# Patient Record
Sex: Female | Born: 1970 | Race: White | Hispanic: No | Marital: Married | Smoking: Former smoker
Health system: Southern US, Community
[De-identification: ages and names within clinical notes are randomized; demographics above are authoritative.]

## PROBLEM LIST (undated history)

## (undated) DIAGNOSIS — R569 Unspecified convulsions: Secondary | ICD-10-CM

## (undated) DIAGNOSIS — F319 Bipolar disorder, unspecified: Secondary | ICD-10-CM

## (undated) DIAGNOSIS — E079 Disorder of thyroid, unspecified: Secondary | ICD-10-CM

## (undated) DIAGNOSIS — R001 Bradycardia, unspecified: Secondary | ICD-10-CM

## (undated) DIAGNOSIS — F419 Anxiety disorder, unspecified: Secondary | ICD-10-CM

## (undated) DIAGNOSIS — F32A Depression, unspecified: Secondary | ICD-10-CM

## (undated) DIAGNOSIS — Z87442 Personal history of urinary calculi: Secondary | ICD-10-CM

## (undated) DIAGNOSIS — F329 Major depressive disorder, single episode, unspecified: Secondary | ICD-10-CM

## (undated) HISTORY — PX: LITHOTRIPSY: SUR834

## (undated) HISTORY — DX: Disorder of thyroid, unspecified: E07.9

## (undated) HISTORY — DX: Bipolar disorder, unspecified: F31.9

## (undated) HISTORY — PX: STRABISMUS SURGERY: SHX218

## (undated) HISTORY — PX: EYE SURGERY: SHX253

---
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
## 1997-12-25 ENCOUNTER — Other Ambulatory Visit: Admission: RE | Admit: 1997-12-25 | Discharge: 1997-12-25 | Payer: Self-pay | Admitting: Obstetrics and Gynecology

## 1998-08-22 ENCOUNTER — Inpatient Hospital Stay (HOSPITAL_COMMUNITY): Admission: AD | Admit: 1998-08-22 | Discharge: 1998-08-24 | Payer: Self-pay | Admitting: Obstetrics & Gynecology

## 1998-08-22 ENCOUNTER — Inpatient Hospital Stay (HOSPITAL_COMMUNITY): Admission: AD | Admit: 1998-08-22 | Discharge: 1998-08-22 | Payer: Self-pay | Admitting: Obstetrics & Gynecology

## 1998-10-09 ENCOUNTER — Emergency Department (HOSPITAL_COMMUNITY): Admission: EM | Admit: 1998-10-09 | Discharge: 1998-10-09 | Payer: Self-pay | Admitting: Emergency Medicine

## 1999-05-30 ENCOUNTER — Other Ambulatory Visit: Admission: RE | Admit: 1999-05-30 | Discharge: 1999-05-30 | Payer: Self-pay | Admitting: Obstetrics and Gynecology

## 2000-05-16 ENCOUNTER — Inpatient Hospital Stay (HOSPITAL_COMMUNITY): Admission: AD | Admit: 2000-05-16 | Discharge: 2000-05-18 | Payer: Self-pay | Admitting: Obstetrics & Gynecology

## 2000-06-15 ENCOUNTER — Other Ambulatory Visit: Admission: RE | Admit: 2000-06-15 | Discharge: 2000-06-15 | Payer: Self-pay | Admitting: Obstetrics and Gynecology

## 2001-05-04 ENCOUNTER — Encounter: Payer: Self-pay | Admitting: Family Medicine

## 2001-05-04 ENCOUNTER — Encounter: Admission: RE | Admit: 2001-05-04 | Discharge: 2001-05-04 | Payer: Self-pay | Admitting: Family Medicine

## 2001-05-18 HISTORY — PX: COLONOSCOPY: SHX174

## 2001-06-02 ENCOUNTER — Encounter (INDEPENDENT_AMBULATORY_CARE_PROVIDER_SITE_OTHER): Payer: Self-pay | Admitting: *Deleted

## 2001-06-02 ENCOUNTER — Inpatient Hospital Stay (HOSPITAL_COMMUNITY): Admission: EM | Admit: 2001-06-02 | Discharge: 2001-06-04 | Payer: Self-pay

## 2001-06-03 ENCOUNTER — Encounter: Payer: Self-pay | Admitting: *Deleted

## 2001-08-17 ENCOUNTER — Other Ambulatory Visit: Admission: RE | Admit: 2001-08-17 | Discharge: 2001-08-17 | Payer: Self-pay | Admitting: Obstetrics and Gynecology

## 2001-09-10 ENCOUNTER — Encounter: Payer: Self-pay | Admitting: Obstetrics and Gynecology

## 2001-09-10 ENCOUNTER — Encounter: Admission: RE | Admit: 2001-09-10 | Discharge: 2001-09-10 | Payer: Self-pay | Admitting: Obstetrics and Gynecology

## 2001-10-21 ENCOUNTER — Inpatient Hospital Stay (HOSPITAL_COMMUNITY): Admission: EM | Admit: 2001-10-21 | Discharge: 2001-10-23 | Payer: Self-pay | Admitting: Psychiatry

## 2002-07-05 ENCOUNTER — Inpatient Hospital Stay (HOSPITAL_COMMUNITY): Admission: AD | Admit: 2002-07-05 | Discharge: 2002-07-07 | Payer: Self-pay | Admitting: Obstetrics and Gynecology

## 2002-07-17 ENCOUNTER — Encounter: Payer: Self-pay | Admitting: *Deleted

## 2002-07-17 ENCOUNTER — Inpatient Hospital Stay (HOSPITAL_COMMUNITY): Admission: AD | Admit: 2002-07-17 | Discharge: 2002-07-17 | Payer: Self-pay | Admitting: *Deleted

## 2002-07-17 ENCOUNTER — Encounter (INDEPENDENT_AMBULATORY_CARE_PROVIDER_SITE_OTHER): Payer: Self-pay | Admitting: Specialist

## 2002-08-02 ENCOUNTER — Other Ambulatory Visit: Admission: RE | Admit: 2002-08-02 | Discharge: 2002-08-02 | Payer: Self-pay | Admitting: Obstetrics and Gynecology

## 2002-10-24 ENCOUNTER — Encounter: Payer: Self-pay | Admitting: Gastroenterology

## 2002-10-24 ENCOUNTER — Ambulatory Visit (HOSPITAL_COMMUNITY): Admission: RE | Admit: 2002-10-24 | Discharge: 2002-10-24 | Payer: Self-pay | Admitting: Gastroenterology

## 2002-10-27 ENCOUNTER — Ambulatory Visit (HOSPITAL_BASED_OUTPATIENT_CLINIC_OR_DEPARTMENT_OTHER): Admission: RE | Admit: 2002-10-27 | Discharge: 2002-10-27 | Payer: Self-pay | Admitting: Urology

## 2002-10-27 ENCOUNTER — Encounter: Payer: Self-pay | Admitting: Urology

## 2002-11-17 HISTORY — PX: ESOPHAGOGASTRODUODENOSCOPY: SHX1529

## 2002-11-30 ENCOUNTER — Ambulatory Visit (HOSPITAL_COMMUNITY): Admission: RE | Admit: 2002-11-30 | Discharge: 2002-11-30 | Payer: Self-pay | Admitting: Gastroenterology

## 2002-11-30 ENCOUNTER — Encounter: Payer: Self-pay | Admitting: Gastroenterology

## 2002-12-05 ENCOUNTER — Ambulatory Visit (HOSPITAL_COMMUNITY): Admission: RE | Admit: 2002-12-05 | Discharge: 2002-12-05 | Payer: Self-pay | Admitting: Gastroenterology

## 2002-12-16 ENCOUNTER — Encounter: Admission: RE | Admit: 2002-12-16 | Discharge: 2002-12-16 | Payer: Self-pay | Admitting: Gastroenterology

## 2002-12-16 ENCOUNTER — Encounter: Payer: Self-pay | Admitting: Gastroenterology

## 2002-12-17 HISTORY — PX: LAPAROSCOPIC CHOLECYSTECTOMY: SUR755

## 2003-01-10 ENCOUNTER — Observation Stay (HOSPITAL_COMMUNITY): Admission: RE | Admit: 2003-01-10 | Discharge: 2003-01-10 | Payer: Self-pay | Admitting: Surgery

## 2003-01-10 ENCOUNTER — Encounter (INDEPENDENT_AMBULATORY_CARE_PROVIDER_SITE_OTHER): Payer: Self-pay | Admitting: Specialist

## 2003-02-10 ENCOUNTER — Inpatient Hospital Stay (HOSPITAL_COMMUNITY): Admission: EM | Admit: 2003-02-10 | Discharge: 2003-02-12 | Payer: Self-pay | Admitting: Psychiatry

## 2003-06-05 ENCOUNTER — Inpatient Hospital Stay (HOSPITAL_COMMUNITY): Admission: EM | Admit: 2003-06-05 | Discharge: 2003-06-07 | Payer: Self-pay | Admitting: Emergency Medicine

## 2003-06-05 ENCOUNTER — Encounter: Payer: Self-pay | Admitting: Emergency Medicine

## 2003-06-07 ENCOUNTER — Inpatient Hospital Stay (HOSPITAL_COMMUNITY): Admission: EM | Admit: 2003-06-07 | Discharge: 2003-06-09 | Payer: Self-pay | Admitting: Psychiatry

## 2003-07-02 ENCOUNTER — Emergency Department (HOSPITAL_COMMUNITY): Admission: EM | Admit: 2003-07-02 | Discharge: 2003-07-02 | Payer: Self-pay | Admitting: Emergency Medicine

## 2003-07-05 ENCOUNTER — Encounter: Admission: RE | Admit: 2003-07-05 | Discharge: 2003-07-05 | Payer: Self-pay | Admitting: Gastroenterology

## 2003-07-07 ENCOUNTER — Inpatient Hospital Stay (HOSPITAL_COMMUNITY): Admission: EM | Admit: 2003-07-07 | Discharge: 2003-07-10 | Payer: Self-pay | Admitting: *Deleted

## 2003-08-25 ENCOUNTER — Other Ambulatory Visit: Admission: RE | Admit: 2003-08-25 | Discharge: 2003-08-25 | Payer: Self-pay | Admitting: Obstetrics and Gynecology

## 2003-10-03 ENCOUNTER — Inpatient Hospital Stay (HOSPITAL_COMMUNITY): Admission: EM | Admit: 2003-10-03 | Discharge: 2003-10-05 | Payer: Self-pay | Admitting: Emergency Medicine

## 2004-08-31 ENCOUNTER — Emergency Department (HOSPITAL_COMMUNITY): Admission: EM | Admit: 2004-08-31 | Discharge: 2004-08-31 | Payer: Self-pay | Admitting: Family Medicine

## 2004-09-18 ENCOUNTER — Other Ambulatory Visit: Admission: RE | Admit: 2004-09-18 | Discharge: 2004-09-18 | Payer: Self-pay | Admitting: Obstetrics and Gynecology

## 2005-05-15 ENCOUNTER — Emergency Department (HOSPITAL_COMMUNITY): Admission: EM | Admit: 2005-05-15 | Discharge: 2005-05-16 | Payer: Self-pay | Admitting: Emergency Medicine

## 2005-05-24 ENCOUNTER — Emergency Department (HOSPITAL_COMMUNITY): Admission: EM | Admit: 2005-05-24 | Discharge: 2005-05-24 | Payer: Self-pay | Admitting: Emergency Medicine

## 2005-06-12 ENCOUNTER — Emergency Department (HOSPITAL_COMMUNITY): Admission: RE | Admit: 2005-06-12 | Discharge: 2005-06-12 | Payer: Self-pay | Admitting: Family Medicine

## 2005-10-27 ENCOUNTER — Other Ambulatory Visit: Admission: RE | Admit: 2005-10-27 | Discharge: 2005-10-27 | Payer: Self-pay | Admitting: Obstetrics and Gynecology

## 2006-03-18 ENCOUNTER — Emergency Department (HOSPITAL_COMMUNITY): Admission: EM | Admit: 2006-03-18 | Discharge: 2006-03-18 | Payer: Self-pay | Admitting: Emergency Medicine

## 2007-10-02 ENCOUNTER — Emergency Department (HOSPITAL_COMMUNITY): Admission: EM | Admit: 2007-10-02 | Discharge: 2007-10-02 | Payer: Self-pay | Admitting: Emergency Medicine

## 2007-10-07 ENCOUNTER — Emergency Department (HOSPITAL_COMMUNITY): Admit: 2007-10-07 | Discharge: 2007-10-07 | Payer: Self-pay | Admitting: Emergency Medicine

## 2010-09-07 ENCOUNTER — Encounter: Payer: Self-pay | Admitting: Gastroenterology

## 2011-01-03 NOTE — Discharge Summary (Signed)
NAME:  [name], [name]                        ACCOUNT NO.:  [id number]   MEDICAL RECORD NO.:  [id number]                   PATIENT TYPE:  INP   LOCATION:  0476                                 FACILITY:  [facility]   PHYSICIAN:  Anselmo Rod, M.D.               DATE OF BIRTH:  1971-04-30   DATE OF ADMISSION:  07/06/2003  DATE OF DISCHARGE:  07/10/2003                                 DISCHARGE SUMMARY   DISCHARGE DIAGNOSES:  1. Nausea and vomiting, abdominal pain of unknown origin.  2. Diarrhea.  3. Bipolar disorder/anxiety depression.  4. Anemia.  5. Amenorrhea, intrauterine device (IUD).  6. Positive barbiturates in urine, unknown cause.  7. Left renal calculi.   DISCHARGE MEDICATIONS:  1. Lithium 600 mg in the a.m., 900 mg in p.m.  2. Cipro 500 mg p.o. b.i.d.  3. Flagyl 250 mg p.o. t.i.d.  No alcohol with this medication.  4. Phenergan suppositories, 25 mg q.6h. p.r.n. for nausea.  5. Effexor 75 mg p.o. daily.  6. Lomotil one tablet t.i.d. p.r.n. for diarrhea.  7. Tylenol 650 mg p.o. q.6h. p.r.n. for pain.  (The patient is not to take her Ambien because it caused confusion.)   PHYSICAL EXAMINATION:  VITAL SIGNS:  At the time of discharge, temperature  97.0, blood pressure 97/55, pulse 77, respirations 18, 98% on room air.  GENERAL:  White female in no acute distress.  Alert and oriented x3.  HEENT:  Pupils are equal, round and reactive to light and accommodation.  No  scleral icterus.  CARDIOVASCULAR:  Regular rate and rhythm.  No murmurs, gallops, or rubs.  LUNGS:  Clear to auscultation bilaterally.  No rhonchi or wheezing.  ABDOMEN:  Soft with mild periumbilical pain to palpation.  No masses  appreciated.  Positive bowel sounds.  No hepatosplenomegaly.  EXTREMITIES:  No edema, cyanosis, or clubbing.  SKIN:  No abnormal lesions.  PSYCHIATRIC:  The patient denied any suicidal ideation or homicidal  ideation.   SIGNIFICANT LABORATORIES DURING ADMISSION:  Totally normal CMP.   Liver  enzymes were totally normal.  Creatinine of 0.6.  Initial CBC showed a white  count of 12.3 and a hemoglobin of 13.2.  Upon discharge, white count was  5.1, hemoglobin was 10.1, platelet count of 203.  UA during hospitalization  just showed trace leukocytes.  A urine micro was negative.  A urine drug  screen showed positive barbiturates and positive opiates.  Amylase was 59,  lipase was 13.  A lithium level was 0.43.  Blood cultures x2 were negative.  Clostridium difficile was negative.  Giardia and Cryptosporidium were  negative.  A stool culture was negative.   STUDIES OBTAINED DURING HOSPITALIZATION:  Abdominal acute series with chest  showed no obstruction or free air, question of probable left renal stone,  and showed IUD in place.  No other abnormalities seen.  Ultrasound of renal  showed question of obstructive uropathy  on the right, and showed mild  caliectasis and pyelectasis on the right.  Suggest getting a CT scan.  CT of  abdomen and pelvis showed no focal abnormalities, so it showed a small intra-  renal calculus in the mid pole of the left kidney approximately 2 mm in  size.  No other urethral stones or obstruction seen.  No retroperitoneal  adenopathy.  No acute abnormalities seen in the abdomen.  Pelvis showed a  small amount of free fluid, showed some thickening of the retroperitoneum,  posterior to the descending colon.  It was felt this could be related to  diverticular disease, but there were no signs of diverticula in this area.   PROBLEM LIST:  Problem 1.  Nausea, vomiting, diarrhea, and abdominal pain.  The patient does have a history of IBS-type features.  She presented to the  outpatient clinic in the office with abdominal pain and continued nausea and  vomiting.  Unclear exactly what was causing it.  Got a CT scan, which was  inconclusive.  The patient does have a history of diverticulosis, and a  questionable history of colitis back several years ago,  diagnosed and  followed by Dr. Virginia Rochester previously.  The patient did have a small sign of left  kidney stones, but no obstructive-type symptoms.  During hospitalization,  the patient was NPO and started on Cipro and Flagyl.  Her diet was  progressed prior to discharge, and the patient was able to tolerate it with  minimal nausea, only having 2-3 episodes of diarrhea per day, not related to  eating.  The patient describes the abdominal pain as periumbilical type  pain.  Unclear if some of this could be coming from a GYN etiology.  No  significant findings on CT scan or ultrasound, but the patient does have an  IUD in place.  The patient will need to follow up with her OB/GYN doctor,  Dr. Vincente Poli, for this.  Will send the patient home on Cipro, Flagyl,  Phenergan suppositories, and Lomotil for her diarrhea.  The patient is to  eat small meals, and avoid eating fatty foods.   Problem 2.  Bipolar disorder/anxiety/depression.  The patient has had a  significant psychiatric history in the past with suicidal attempt back in  October.  The patient is followed by Dr. Andee Poles, and is to follow  up with her in one week.  On admission, her lithium level was 0.43, which  was subtherapeutic.  The patient had missed several doses, or vomited them  back up and was unable to take them.  She is to resume her lithium at 600 in  the morning and 900 in the evening.  She is also to resume her Effexor 75 mg  daily.  She was evaluated by Dr. Jeanie Sewer while in the hospital, and he  felt that she was not at any risk of harm to herself or to others.  He felt  that some of her problems could be coming from withdrawal-type picture from  Effexor.  She did report to him that she was having some perioral  paresthesias, and can also cause GI symptoms and aches, so will resume  Effexor at 75 mg daily.  It sounds like she was off this several days prior  to presenting this time.  Problem 3.  Anemia.  Unclear cause of  this anemia.  The patient has had  guaiac negative stools.  Has not had any signs of acute bleed.  She was  approximately 4-1/2 liters up since her admission, and may be some  dilutional aspect of it.  Her previous hemoglobin while in the ED, which  could have been secondary to dehydration was 13.2; upon discharge it was  10.1.  Will need to follow closely with this with follow up laboratory work  in one week.  The patient is to be alert of any signs of bleeding.   Problem 4.  Amenorrhea/IUD in place.  The patient needs follow up with Dr.  Vincente Poli for any questions she has about GYN problems.  She did have some mild  spotting today, which she said she has been without a period for  approximately one year since her child was born.  She did say she did breast  feed for seven months, and has not had any periods in the last 3-4 months  since then.   Problem 5.  Positive barbiturates in urine.  Unclear cause of this.  Did  send out barbiturate level, as well as identification of a specific  barbiturate.  The patient denies taking any medicines, and reviewed all the  medicines she had in the house including her children's medicines to see if  this could be contributing to this positivity in the urine.  Will need to  follow up with this result to see if there is anything in particular about  this level.   Problem 6.  Mild leukocytosis, fever, and hypotension on admission.  All  three of these have resolved, except for the hypotension.  The patient has  remained in the 90's to 100's systolically throughout her hospitalization,  and looking back at her previous records she had a baseline of this range  previously.  The patient is asymptomatic and does not have any dizziness or  orthostatic hypotension at this time.  We will continue to monitor as an  outpatient.   DISPOSITION:  The patient was given special instructions that if she has any  bloody diarrhea, fever, chills, continued nausea, or  vomiting, she is to  call Dr. Kenna Gilbert office immediately.  She is to follow up with Dr. Nolen Mu  on Monday, July 17, 2003, at 11 a.m., and it was emphasized that it was  very important for her to keep this appointment.  She is to follow up with  Dr. Loreta Ave on July 18, 2003, at which time, we will need to get a CBC and  a BMET to recheck some of her levels.  She is also encouraged to call Dr.  Vincente Poli to follow up with any questions she might have with GYN problems, and  any questions or concerns she might have with her IUD in place.     Catalina Pizza, M.D.                           Anselmo Rod, M.D.    ZH/MEDQ  D:  07/10/2003  T:  07/10/2003  Job:  [id number]   cc:   Andee Poles, M.D.  [address], [address]  [place], [place] [address]  Fax: [phone]   Dr. Milton Ferguson(?), OB/GYN

## 2011-01-03 NOTE — H&P (Signed)
NAME:  [name], [name]                        ACCOUNT NO.:  [id number]   MEDICAL RECORD NO.:  [id number]                   PATIENT TYPE:  INP   LOCATION:  1832                                 FACILITY:  [facility]   PHYSICIAN:  Elliot Cousin, M.D.                 DATE OF BIRTH:  12-05-1970   DATE OF ADMISSION:  10/03/2003  DATE OF DISCHARGE:                                HISTORY & PHYSICAL   CHIEF COMPLAINT:  Suicide attempt/drug overdose.   HISTORY OF PRESENT ILLNESS:  [name] is a 40 year old Caucasian lady with  a past medical history significant for bipolar disorder and borderline  personality, who presented to the emergency department with an attempt to  kill herself.  The history is given by the patient's parents as well as the  patient.  Apparently, the patient took 10 to 15 Seroquel 100 mg pills at  approximately 5:45 p.m. on the 14th of February.  She called her  psychologist, who subsequently called her husband.  Her husband called the  patient's mother.  The patient admitted that she was trying to commit  suicide.  She actually wrote a suicide note, however, her mother does not  recall what it said.  The suicide note is not present in the emergency  department.  The patient states that she has been depressed.  She did not go  into further detail.  The patient's mother states that the patient and her  husband have not been getting along.   The patient denies any chest pain, shortness of breath, abdominal pain,  nausea, vomiting, diarrhea, melena, hematochezia, dysuria.  She denies  taking any other medication in abundance with the exception of her Seroquel.  She does take lithium, Lamictal and Effexor in the prescribed doses.  She  denies any recent alcohol use, however, she admits to drinking mixed drinks  daily if not every other day.  She denies any illicit drug use.   When the patient was evaluated in the emergency department, her systolic  blood pressures ranged  between 75 and 90.  She is somewhat lethargic but  appropriate in her answers.  She is afebrile and her oxygen saturation is  97% on room air.  Her pulse is currently 80 and her respiratory rate is 20.  The patient was evaluated by [facility] personnel tonight.  Per the  assessment by the [facility] team, the patient needs psychiatric  inpatient hospitalization, however, Dr. Jeanice Lim, who was consulted,  was concerned about the patient's low blood pressure readings.  He  recommended not admitting the patient for psychiatric hospitalization until  the patient's blood pressures were stable and within normal limits.  Of  note, the patient and the patient's mother confirm that the patient  generally has low to normal blood pressures generally ranging in the range  of 89-92 systolically.   PAST MEDICAL HISTORY:  1. Bipolar disorder with  a history of multiple hospitalizations for suicide     attempts.  2. Borderline personality.  3. Admission in November of 2004 for abdominal pain, nausea and vomiting,     etiology unknown.  4. History of left renal calculus per CT scan of the abdomen, November 2004.  5. Colonoscopy per Dr. Melrose Nakayama. Virginia Rochester in October of 2002 revealed a thickened     cecum and ileum, with the pathology report suggesting ischemic colitis.  6. Status post laparoscopic cholecystectomy in May of 2004 per Dr. Abigail Miyamoto.  7. Status post IUD.  8. History of irregular menses.  9. 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   MEDICATIONS:  1. Seroquel 25 to 50 mg daily (? dose).  2. Lithium 300 mg 1 to 2 tablets b.i.d. and 900 mg nightly.  3. Effexor XR 150 mg daily.  4. Lamictal once daily, dose unknown (the last dictated discharge summary in     November 2004 revealed that the Lamictal dose was 150 mg daily).   ALLERGIES:  No known drug allergies.   SOCIAL HISTORY:  The patient is married, although the marriage is strained   per the patient's mother.  She has 3 children, age 35, 3 and 5 years.  She  denies tobacco use.  She drinks 1 to 2 drinks of vodka on a daily basis, if  not every other day.  She denies illicit drug use.  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   FAMILY HISTORY:  Her mother is 50 years of age and has a history of back  surgery and Charcot-Tooth syndrome.  Her father is 74 years of age and has  no known health problems.   REVIEW OF SYSTEMS:  Review of systems as above in the history of present  illness.  In addition, the patient denies any recent complaints of headache,  visual changes, dysphagia, chest pain, shortness of breath, cough, upper  respiratory infection symptoms, fever, chills, weight loss, weight gain,  abdominal pain, nausea, vomiting, diarrhea, swelling in her legs, rash or  joint pain.   EXAM:  VITAL SIGNS:  Temperature 97.5, blood pressure 82/50, pulse 80,  respiratory rate 20, oxygen saturation on 2 L 99%.  GENERAL:  The patient is an average-sized 40 year old Caucasian woman who is  currently lethargic but in no acute distress.  HEENT:  Head is normocephalic and non-traumatic.  Pupils are equal, round  and reactive to light.  Extraocular movements are intact.  Conjunctivae are  clear.  Sclerae are white.  Tympanic membranes are clear bilaterally.  Nasal  mucosa is moist.  No sinus tenderness.  No nasal drainage.  Oropharynx  reveals dry mucous membranes, no posterior exudates or erythema.  Teeth are  in fair repair.  NECK:  Neck is supple with no adenopathy, no thyromegaly, no bruits.  LUNGS:  Lungs are clear to auscultation with decreased breath sounds in the  bases.  HEART:  S1 and S2 with no murmurs, rubs, or gallops.  ABDOMEN:  Well-healed small epigastric and right upper quadrant abdominal  scars.  Bowel sounds are hypoactive.  Her abdomen is soft, positive bowel sounds, nontender, nondistended.  No hepatosplenomegaly, no masses palpated.  RECTAL AND GU:   Deferred.  EXTREMITIES:  The patient has good range of motion actively and passively of  all of her muscle joints.  Pedal pulses are 2+ bilaterally.  No pretibial  edema.  No pedal edema.  SKIN:  No rashes.  Skin turgor is good.  On her arms bilaterally, she has  multiple superficial lacerations from self-inflicted knife and razor cuts.  No surrounding erythema or drainage of any of the superficial lacerations on  her arms.  NEUROLOGIC:  The patient is lethargic, however, she is readily arousable and  answers questions appropriately when aroused.  She is oriented x 3.  Her  speech is appropriate.  Cranial nerves II-XII are intact.  Sensation is  intact.  Her hand grip bilaterally is 5/5.  She moves all of her extremities  voluntarily.  PSYCHIATRIC:  The patient is lethargic but alert when aroused.  Her affect  is flat.  She admits to suicidal ideation and admits to the suicidal gesture  of taking an overdose of Seroquel.   ADMISSION LABORATORIES:  EKG reveals normal sinus rhythm.   Urine drug screen is negative.  Tricyclic is positive.  Acetaminophen less  than 10, salicylate less than 4.  Lithium level 0.49 (0.80 to 1.4 normal  range).  Alcohol level less than 5.  Sodium 137, potassium 3.8, chloride  105, CO2 28, glucose 103, BUN 6, creatinine 0.7, calcium 8.9.   ASSESSMENT:  1. Drug overdose with Seroquel secondary to attempted suicide.  The patient     has had multiple admissions for suicide attempts.  She has a history of     bipolar disorder and borderline personality.  Apparently, there was a     suicide note that was written but the note was not retrieved by the     patient or her parents.  The patient is surprisingly alert and     communicative when aroused.  When not aroused, she is somewhat lethargic.     The patient's urine drug screen is negative, however, her serum tricyclic     test is positive.  The patient denies any known intake of tricyclic     antidepressants.  2.  Hypotension.  This is a real concern.  The patient and the patient's     mother admit that the patient's blood pressure generally runs in the     upper 80s and the low 90s systolically.  With the Seroquel on board, her     blood pressure actually got as low as 75 systolically in the emergency     department.  With the Seroquel overdose, hypotension is common, as well     as central nervous system depression and possibly Q-T prolongation.   PLAN:  1. The patient was given IV fluid boluses with normal saline totalling     approximately 2.5 L thus far.  2. Dopamine was started at 10 mcg per minute to keep the patient at 90 or     above systolically.  3. We will admit the patient to the ICU for careful monitoring of her     hemodynamics.  4. We will continue IV fluids with half normal saline with an ampule of     bicarb and 20 mEq of potassium chloride at 150 mL an hour. 5. We will repeat an EKG, a BMET, lithium level and liver function tests at     noon today.  6. We will assess TSH and a free T4.  7. We will check a urine pregnancy test as well as a UA/C&S.  8. Once the patient is stable, we will reconsult Behavioral     Health/psychiatry.  Elliot Cousin, M.D.    DF/MEDQ  D:  10/03/2003  T:  10/03/2003  Job:  [id number]

## 2011-01-03 NOTE — Op Note (Signed)
   NAME:  [name], [name]                        ACCOUNT NO.:  [id number]   MEDICAL RECORD NO.:  [id number]                   PATIENT TYPE:  AMB   LOCATION:  ENDO                                 FACILITY:  [facility]   PHYSICIAN:  Anselmo Rod, M.D.               DATE OF BIRTH:  1971-08-11   DATE OF PROCEDURE:  12/05/2002  DATE OF DISCHARGE:                                 OPERATIVE REPORT   PROCEDURE PERFORMED:  Esophagogastroduodenoscopy.   ENDOSCOPIST:  Anselmo Rod, M.D.   INSTRUMENT USED:  Olympus video panendoscope.   INDICATIONS FOR PROCEDURE:  Ongoing nausea with epigastric pain in a 40-year-  old white female rule out peptic ulcer disease, gastroparesis, etc.   PREPROCEDURE PREPARATION:  Informed consent was procured from the patient.  The patient fasted for eight hours prior to the procedure.   PREPROCEDURE PHYSICAL:  VITAL SIGNS:  The patient had stable vital signs.  NECK:  Supple.  CHEST:  Clear to auscultation.  S1 and S2 regular.  ABDOMEN:  Soft with normal bowel sounds.   DESCRIPTION OF PROCEDURE:  The patient was placed in the left lateral  decubitus position.  Sedated with 40 mg of Demerol, 4 mg of Versed  intravenously.  Once the patient was adequately sedated and maintained on  low flow oxygen, and continuous cardiac monitoring, the Olympus video  panendoscope was advanced through the mouth piece over the tongue into the  esophagus under direct vision.  The entire esophagus appeared normal with no  evidence of ring, stricture, masses, esophagitis or Barrett's mucosa.  The  scope was then advanced into the stomach.  Retroflexion of the high cardia  revealed no abnormalities.  The entire gastric mucosa and the proximal small  bowel appeared normal.   IMPRESSION:  Normal esophagogastroduodenoscopy.   RECOMMENDATIONS:  1. Check a pregnancy test today.  2.     HIDA scan with injection at the earliest.  This was not done earlier as the      patient was  breast feeding; however, considering her ongoing disability     with nausea and abdominal pain, a HIDA scan will be planned.  Breast     feeds will be held and further recommendations will be made as-needed.                                               Anselmo Rod, M.D.    JNM/MEDQ  D:  12/05/2002  T:  12/05/2002  Job:  [id number]   cc:   Marcelino Duster L. Vincente Poli, M.D.  [address], [address]  [place] [address]  Fax: [phone]

## 2011-01-03 NOTE — Discharge Summary (Signed)
NAME:  [name], [name]                        ACCOUNT NO.:  [id number]   MEDICAL RECORD NO.:  [id number]                   PATIENT TYPE:  IPS   LOCATION:  0501                                 FACILITY:  [facility]   PHYSICIAN:  Geoffery Lyons, M.D.                   DATE OF BIRTH:  08-10-1971   DATE OF ADMISSION:  02/10/2003  DATE OF DISCHARGE:  02/12/2003                                 DISCHARGE SUMMARY   CHIEF COMPLAINT AND PRESENT ILLNESS:  This was the first admission to [facility] for this 40 year old married female with suicidal  ideation, plan to overdose and wreck her car.  Depressed on an off for 1-1/2  years.  Saw Dr. Andee Poles.  She was already on Effexor XR 150 mg per  day.  She was told that the Effexor would take time to work but she felt  desperate and her primary physician recommended for her to be hospitalized.  Endorsed sadness, edginess.  No problem with sleep.  Difficulty collecting  her thoughts.  Endorsed occasional suicidal ruminations.   PAST PSYCHIATRIC HISTORY:  First time was in March of 2003.  Initially  Zoloft under direction of Dr. Evelene Croon.  Shortly after started on Zoloft, she  was admitted to the hospital.  More recently, Effexor.   ALCOHOL/DRUG HISTORY:  Denies the use or abuse of any substances.   PAST MEDICAL HISTORY:  Denies history of any major medical condition.   PHYSICAL EXAMINATION:  Performed and failed to show any acute findings.   MENTAL STATUS EXAM:  Alert, oriented female.  Well-groomed, neat,  cooperative.  Speech was not pressured.  No psychotic content.  Mood was  labile.  Cries easily.  Had acknowledged to be depressed.  Clear thought  process.  Denies any suicidal or homicidal ideation.  Cognition well-  preserved.   ADMISSION DIAGNOSES:   AXIS I:  Major depression, recurrent.   AXIS II:  No diagnosis.   AXIS III:  No diagnosis.   AXIS IV:  Moderate.   AXIS V:  Global Assessment of Functioning upon  admission 30; highest Global  Assessment of Functioning in the last year 70.   HOSPITAL COURSE:  She was admitted and started intensive individual and  group psychotherapy.  She was maintained on Effexor XR 150 mg per day and  she was given some Xanax 0.5 mg as needed for anxiety.  There was a family  session with her husband where she denied any suicidal or homicidal  ideation.  He was supportive.  She endorse that she saw depression increase  but she was not suicidal.  She started sleeping well in the hospital.  Effexor was increased to 300 mg per day and tolerated that well.  Had a good  session with the husband.  Was committed to work things out.  Affect was  brighter.  There was no tearfulness.  Increased insight.  Willingness and  motivated to pursue further outpatient treatment.  As she was endorsing no  suicidal ideation and she was objectively better, we discharged to  outpatient follow-up.   DISCHARGE DIAGNOSES:   AXIS I:  Major depression, recurrent.   AXIS II:  No diagnosis.   AXIS III:  No diagnosis.   AXIS IV:  Moderate.   AXIS V:  Global Assessment of Functioning upon discharge 55-60.   DISCHARGE MEDICATIONS:  1. Effexor XR 150 mg twice a day.  2. Ambien 10 mg at bedtime for sleep.  3. Xanax 0.5 mg every six hours as needed for anxiety.   FOLLOW UP:  Andee Poles, M.D.                                               Geoffery Lyons, M.D.    IL/MEDQ  D:  03/08/2003  T:  03/09/2003  Job:  [id number]

## 2011-01-03 NOTE — H&P (Signed)
NAME:  [name], [name]                        ACCOUNT NO.:  [id number]   MEDICAL RECORD NO.:  [id number]                   PATIENT TYPE:  INP   LOCATION:  1823                                 FACILITY:  [facility]   PHYSICIAN:  Hollice Espy, M.D.            DATE OF BIRTH:  02-Mar-1971   DATE OF ADMISSION:  06/05/2003  DATE OF DISCHARGE:                                HISTORY & PHYSICAL   CHIEF COMPLAINT:  Overdose.   HISTORY OF PRESENT ILLNESS:  This is a 40 year old white female with past  medical history of depression and previous suicide attempts who presented  after overdosing on Xanax. Reportedly, the patient had been complaining of  some depression and asked to meet with Dr. Vincente Poli. Reportedly one of Dr.  Lynnell Dike nurses met the patient at a disclosed area and while talking to the  patient, the patient became very lethargic. She then admitted that she had  taken a lot of Xanax. She had reportedly combined 2 prescriptions and had  taken anywhere from 30 to 60 Xanax. The patient was [facility] into [facility] emergency room and a urine drug screen was done which showed  positive only for benzodiazepines. Otherwise, she was extremely lethargic  and mentally unresponsive. Her pupils were more pin point. Other lab work  which was done included a urinalysis, blood gas, ethyl alcohol, salicylate,  Tylenol, CBC and CMP, all of which were normal. The patient did admit that  she wanted to kill herself prior to the onset of her Xanax taking effect.  Currently, the patient is extremely lethargic and minimally arousable. She  is not able to give a good history or review of systems.  She also has  previous history too of depression, previous suicide attempts by overdose  and history of renal stones.   SOCIAL HISTORY:  She has a positive history of smoking. No reported history  of heavy alcohol abuse.   MEDICATIONS:  The patient has been on Xanax and Effexor, Lithium and  Lamictal.   ALLERGIES:  She has a questionable history to ERYTHROMYCIN, although this is  reported as causing nausea, some abdominal pain.   FAMILY HISTORY:  Unable to be obtained.   PHYSICAL EXAMINATION:  VITAL SIGNS:  On admission temperature of 98, heart  rate of 72, blood pressure 90/48, respiratory rate 16. O2 sat 100% on room  air. Her blood pressure did dip down to 80/39 and she received an IV fluid  bolus and her blood pressure is starting to trend up.  GENERAL:  She appears to be in no apparent distress and is breathing on her  own. She is neither alert or oriented.  HEENT:  Normocephalic, atraumatic. Her pupils are mid point and sluggish to  light. She has no carotid bruits.  HEART:  Regular rate and rhythm. S1 and S2.  LUNGS:  Clear to auscultation bilaterally.  ABDOMEN:  Soft, nondistended. Positive  bowel sounds.  EXTREMITIES:  No clubbing, cyanosis, or edema.   LABORATORY DATA:  Sodium 140, potassium 3.6, chloride 107, bicarb 29, BUN 7,  creatinine 0.9, glucose of 80. Calcium 9.8. Liver function studies are all  within normal limits. White count 7.0 with a 65% shift. H&H 12.5 and 36.8.  MCV of 86. White count of 268. Salicylate level less than 4. Acetaminophen  level less than 10. Alcohol level less than 5. Urine drug screen only  positive for benzodiazepines. UA is negative with pH of 7.38, pCO2 of 46,  pO2 of 110, bicarb 27.   ASSESSMENT/PLAN:  This is a 40 year old white female with past medical  history of depression and suicide attempt who presents status post Xanax  overdose. At this point, will given supportive care. She appears to be  stable, protecting her own airway. Will give her IV fluids, watch her blood  pressure. Give her a sitter for suicidal watch and supportive measures. Will  ask psych and behavioral health to see this patient. They know her from  previous history.                                                Hollice Espy, M.D.     SKK/MEDQ  D:  06/05/2003  T:  06/06/2003  Job:  [id number]   cc:   Marcelino Duster L. Vincente Poli, M.D.  [address] [place]  [place] [address]  Fax: [phone]

## 2011-01-03 NOTE — Discharge Summary (Signed)
NAME:  [name], [name]                        ACCOUNT NO.:  [id number]   MEDICAL RECORD NO.:  [id number]                   PATIENT TYPE:  IPS   LOCATION:  0506                                 FACILITY:  [facility]   PHYSICIAN:  Geoffery Lyons, M.D.                   DATE OF BIRTH:  01-16-1971   DATE OF ADMISSION:  06/07/2003  DATE OF DISCHARGE:  06/09/2003                                 DISCHARGE SUMMARY   CHIEF COMPLAINT AND PRESENTING ILLNESS:  This was the third admission to  [facility]  for this 40 year old married white female,  history of hospitalized since Monday at [facility], feeling bad on Monday,  depressed, overdosed on Xanax, states talked to her mother-in-law.  She took  her 3 children and went to get Xanax refill.  Took 2 or 3 just to see what  it would do.  Went to a friend and took some more and does not remember the  rest.  She felt that she wanted to end it all, was feeling that she was  taking space, screaming at the kids, stressed, marital conflict with the  husband.   PAST PSYCHIATRIC HISTORY:  Sees Dr. Nolen Mu, diagnosed bipolar, hypomanic,  here on June 12.   ALCOHOL AND DRUG HISTORY:  Nonsmoker, Margaritas, Pina Colada 4-5 times a  week, does not see it as a problem.   PAST MEDICAL HISTORY:  Noncontributory.   MEDICATIONS:  Lithium 600 twice a day, Lamictal 150 mg daily, Effexor XR 150  mg daily, Xanax 1 mg as needed.   PHYSICAL EXAMINATION:  Performed, failed to show any acute findings.   MENTAL STATUS EXAM:  Revealed an alert young female, cooperative, casually  dressed, good eye contact, speech clear, mood depressed, affect constricted.  Thoughts coherent, no evidence of psychosis.  Cognition well preserved.  Denies any auditory hallucinations, no delusions.  Denies any suicidal or  homicidal ideas.   ADMISSION DIAGNOSES:   AXIS I:  Bipolar disorder.   AXIS II:  No diagnosis.   AXIS III:  No diagnosis.   AXIS IV:  Moderate.   AXIS V:  Global assessment of function upon admission 30, highest global  assessment of function in past year 65.   LABORATORY DATA:  Urine pregnancy test was negative.  EKG was normal.   COURSE IN HOSPITAL:  She was admitted and started on intensive individual  and group psychotherapy.  She was placed back on the lithium 600 twice a  day, Effexor XR 150 mg daily and she was placed on Lamictal 100 mg daily.  Lamictal was increased to 150.  She was given some Ambien for sleep.  Was  completely off the Xanax.  Did admit to mood swings, more recently depressed  due to the increased stress, has not been able to deal with demands from the  children and the husband.  Does not feel she has  a break where she can take  care of herself.  Really overwhelmed, overdosed, said that she was not happy  when she woke up.  Lithium used to help but felt that it was not helping any  more.  There was a family session with the husband.  She was able to express  to the husband the sense of being overwhelmed, not having time to herself.  There were some other issues going on.  But they seemed to be able to start  identifying and willing to address them.  On October 22, she was in full  contact with reality.  She was denying any suicidal or homicidal ideas, did  not express wanting to hurt herself.  She was wanting to be discharged to  resume taking care of her children.  Will see Dr. Nolen Mu and pursue  further psychotherapy.  Discussed the issue with the husband who was in  agreement that she was safe to be discharged.   DISCHARGE DIAGNOSES:   AXIS I:  Bipolar disorder.   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   AXIS III:  No diagnosis.   AXIS IV:  Moderate.   AXIS V:  Global assessment of function upon discharge 50.   DISCHARGE MEDICATIONS:  1. Lithium 300 2 in the morning and 2 at night.  2. Effexor XR 150 in the morning.  3. Lamictal 100 1-1/2 daily.  4. Ambien 10 at bedtime for  sleep.   DISPOSITION:  Follow up with Andee Poles and [facility].                                               Geoffery Lyons, M.D.    IL/MEDQ  D:  07/14/2003  T:  07/14/2003  Job:  [id number]

## 2011-01-03 NOTE — Op Note (Signed)
NAME:  [name], [name]                        ACCOUNT NO.:  [id number]   MEDICAL RECORD NO.:  [id number]                   PATIENT TYPE:  AMB   LOCATION:  DAY                                  FACILITY:  [facility]   PHYSICIAN:  Abigail Miyamoto, M.D.              DATE OF BIRTH:  April 05, 1971   DATE OF PROCEDURE:  01/10/2003  DATE OF DISCHARGE:                                 OPERATIVE REPORT   PREOPERATIVE DIAGNOSIS:  Biliary dyskinesia.   POSTOPERATIVE DIAGNOSIS:  Biliary dyskinesia.   OPERATION/PROCEDURE:  Laparoscopic cholecystectomy.   SURGEON:  Abigail Miyamoto, M.D.   ANESTHESIA:  General endotracheal anesthesia.   ESTIMATED BLOOD LOSS:  Minimal.   DESCRIPTION OF PROCEDURE:  The patient was brought to the operating room,  identified as [name].  She was placed supine on the operating room  table and general anesthesia was induced.  Her abdomen was then prepped and  draped in the usual sterile fashion.  Using #15 blade, a small transverse  incision was made below the umbilicus.  The incision was carried down to the  fascia which was then opened with a scalpel.  Hemostat was then used to pass  through the peritoneal cavity.  Next, a 0 Vicryl pursestring was placed  around the fascial opening.  The Hasson port was placed through the opening  and insufflation of the abdomen was begun.  Next, a 12 mm port was placed in  the patient's epigastrium and two 5 mm ports were placed in the patient's  right flank under direct vision.  The gallbladder was then grasped and  retracted above the liver bed.  Multiple adhesions to the gallbladder fundus  were then taken down bluntly as well as with the electrocautery.  The cystic  duct was then identified and clipped once distally and partly transfixed  with the laparoscopic scissors.  An angiocatheter was then inserted in the  right upper quadrant and a cholangiocatheter was passed through this and  placed into the cystic duct.  Attempt  was then made at cholangiography.  The  C-arm machine from radiology had a lot of difficulty and could not take any  pictures.  After multiple attempts at this, decision was made to forego the  cholangiogram as the patient did not have gallstones.  At this point the  cholangiocatheter was removed as well as the angiocatheter.  The cystic duct  was then clipped three times proximally and transected with the scissors.  The cystic artery was identified, clipped three times proximally and once  distally and transected as well.  The gallbladder was then easily dissected  free from the liver bed with the electrocautery.  Once the gallbladder was  free from the liver bed, the liver bed was examined and hemostasis was  achieved.  The gallbladder was then removed through the incision at the  umbilicus.  The 0 Vicryl at the umbilicus was tied in place  closing the  fascial defect.  The abdomen was then irrigated with normal saline.  Again  hemostasis appeared to be achieved.  All ports were removed under direct  vision and the abdomen was deflated.  All incisions were then anesthetized  with 0.25% Marcaine and then closed with 4-0 Monocryl subcuticular sutures.  Steri-Strips, gauze and tape were then applied.  The patient tolerated the  procedure well.  All sponge, needle and instrument counts were correct at  the end of the procedure.  The patient was then extubated in the operating  room and taken in stable condition to the recovery room.                                              Abigail Miyamoto, M.D.   DB/MEDQ  D:  01/10/2003  T:  01/10/2003  Job:  [id number]

## 2011-01-03 NOTE — Procedures (Signed)
[place]. [facility]  Patient:    HENLEY, BLYTH Visit Number: [id number] MRN: [id number]          Service Type: MED Location: 3000 3027 01 Attending Physician:  Sabino Gasser Dictated by:   Sabino Gasser, M.D. Proc. Date: 06/04/01 Admit Date:  06/02/2001 Discharge Date: 06/04/2001   CC:         Dr. Paris Lore [place]   Procedure Report  PROCEDURE:  Colonoscopy.  INDICATION:  Rectal bleeding, abdominal pain, thickened right colon.  ANESTHESIA:  Demerol 80 mg, Versed 8 mg.  DESCRIPTION OF PROCEDURE:  With the patient mildly sedated in the left lateral decubitus position, the Olympus videoscopic variable-stiffness colonoscope was inserted in the rectum and passed under direct vision to the cecum, identified by the ileocecal valve and appendiceal orifice, both of which were photographed.  The cecum was moderately inflamed, thickened, and oozing blood. Photographs and multiple biopsies were taken.  We then entered into the terminal ileum, which grossly appeared normal; however, there were some areas of serpiginous thickening changes, which may just be a nonspecific lymphoid reaction, but these were photographed and biopsied as well.  From this point the endoscope was then withdrawn, taking circumferential views of the remaining terminal ileum and colonic mucosa, stopping to photograph the rectum, which appeared normal on direct and retroflex view.  The endoscope was straightened and withdrawn.  The patients vital signs and pulse oximetry remained stable.  The patient tolerated the procedure well without apparent complications.  FINDINGS:  Thickened cecum with mildly thickened ascending colon with abrupt change to normal at the level of the hepatic flexure.  Otherwise a normal colonoscopy with localized areas of serpiginous thickening in the terminal ileum.  PLAN:  Await biopsy report.  Will have patient call me for results as an outpatient.  Will  discharge her at this time and have her follow up with me as an outpatient. Dictated by:   Sabino Gasser, M.D. Attending Physician:  Sabino Gasser DD:  06/04/01 TD:  06/05/01 Job: [id number]

## 2011-01-03 NOTE — Discharge Summary (Signed)
[place]. [facility]  Patient:    [name], [name] Visit Number: [id number] MRN: [id number]          Service Type: MED Location: 3000 3027 01 Attending Physician:  Sabino Gasser Dictated by:   Sabino Gasser, M.D. Admit Date:  06/02/2001 Discharge Date: 06/04/2001   CC:         Dr. Paris Lore [place]   Discharge Summary  HISTORY OF PRESENT ILLNESS:  [name] is a 40 year old female who presented with nausea approximately six weeks prior to admission.  The nausea cleared; however, the patient developed abdominal pain of crampy nature one week prior to admission, which had gotten progressively worse over the past few days, pain lasting about 10 minutes, better when she reclines, not made better with passage of flatus or bowel movement.  Over the past two days before admission, she had noticed maroon blood in her bowel movement.  Evaluation previous to admission showed a negative upper GI and ultrasound done here at [facility].  PAST MEDICAL HISTORY:  Negative except for some problems with indigestion approximately nine years prior to admission.  ALLERGIES:  She had no allergies.  Has an intolerance to ERYTHROMYCIN.  MEDICATIONS:  Was taking no medications.  Had not been on birth control pills for approximately four years, stopping after using them for nine years from age 40 to approximately 14.  FAMILY HISTORY:  Negative for GI disorder.  REVIEW OF SYSTEMS:  Positive for 18-pound weight loss, which was involuntary, since May of this year, and history of a number of strep throats in the spring of this year.  SOCIAL HISTORY:  She lives with her husband and two children, who are alive and well, ages 93 and 1.  PHYSICAL EXAMINATION:  On admission she was afebrile, blood pressure 98/57, pulse 62, and O2 saturation 100%.  Examination remarkable for mild right-sided abdominal tenderness.  The remainder of her examination was essentially unremarkable  for a 40 year old female.  HOSPITAL COURSE:  We ordered a CT scan as our first diagnostic study.  All her initial admitting laboratory studies, including CBC and CMET, were normal.  CT scan showed thickening of the right colon, and therefore she was prepped for a colonoscopy, which was done on the 18th, and findings on colonoscopy were a thickened right colon and some localized changes in the terminal ileum. Biopsies were obtained and were pending at the time of discharge.  Impression at the time of discharge was that the patient was improved and able to go home.  The concern was for possible ischemic colitis and anticipating that diagnosis, a protein C, S, and antithrombin-3 levels were obtained.  The patient will call me for results of her biopsies, and she will follow up with me as an outpatient in the next few weeks. Dictated by:   Sabino Gasser, M.D. Attending Physician:  Sabino Gasser DD:  06/04/01 TD:  06/05/01 Job: [id number]

## 2011-01-03 NOTE — H&P (Signed)
NAMECARLIS, [name]                          ACCOUNT NO.:  [id number]   MEDICAL RECORD NO.:  [id number]                   PATIENT TYPE:  IPS   LOCATION:                                       FACILITY:  [facility]   PHYSICIAN:  [id number]                                DATE OF BIRTH:  1971-08-12   DATE OF ADMISSION:  10/21/2001  DATE OF DISCHARGE:  10/23/2001                         PSYCHIATRIC ADMISSION ASSESSMENT   IDENTIFYING INFORMATION:  The patient is a 40 year old married white female  voluntarily admitted for depression and suicidal ideation on October 21, 2001.   HISTORY OF PRESENT ILLNESS:  The patient presents with a history of  depression for the past two months.  The patient states she has been tired  all the time, not wanting to spend time with her children, wanting to left  alone, feeling very restless, having suicidal thoughts.  She had been  wanting her death to be like it was an accident.  She reports that she has  been ill over the summer, having problems with colitis, streptococcus throat  with a substantial weight loss of 36 pounds, and her mother-in-law being  hospitalized and feels things have been just overwhelming.  The patient  feels that she has lost control.  She was better after taking her Zoloft,  feeling great, like she was on a high, and then got worse than ever.  She reports no psychotic symptoms, denies any suicidal or homicidal  ideation.   PAST PSYCHIATRIC HISTORY:  First hospitalization to [facility].  She sees Dr. Evelene Croon as an outpatient.  Her last visit was on the  Wednesday prior to this admission.  No other hospitalizations, no prior  suicide attempts.   SUBSTANCE ABUSE HISTORY:  She is a nonsmoker.  She denies any alcohol or  substance abuse.   PAST MEDICAL HISTORY:  Primary care [place]: None.  Medical problems: None.   MEDICATIONS:  1. Zoloft 150 mg every day.  2. Xanax that was prescribed by Dr. Para March in [place].   The  patient states she felt better initially with her medication and felt  like she was on a high and having a lot of energy.   DRUG ALLERGIES:  No know allergies.   PHYSICAL EXAMINATION:  VITAL SIGNS:  Temperature 97.4, heart rate 61,  respiratory rate 16, blood pressure 99/62.  GENERAL:  The patient appears as a well nourished, well developed female  without any complaints.   LABORATORY DATA:  CBC is within normal limits.  CMET: Potassium 3.3.   SOCIAL HISTORY:  She is a 40 year old married white female, married for  eight years.  She has two children ages 37 and 22 months.  She lives with her  husband and children.  She works as a IT trainer.  She has no legal problems.  She  has a supportive husband.   FAMILY HISTORY:  None.   MENTAL STATUS EXAM:  She is an alert, young adult, casually and neatly  dressed female, cooperative, good eye contact.  Speech is normal, relevant,  and articulate.  Mood is depressed.  Affect: The patient appears  superficially bright, then teary-eyed.  Thought processes are positive  obsessive thoughts, no auditory or visual hallucinations, no suicidal or  homicidal ideation or psychosis.  Cognitive: Intact.  Memory is good.  Judgment is fair.  Insight is good.  Appears to have above average  intelligence.   ADMISSION DIAGNOSES:   AXIS I:  1. Major depressive disorder with suicidal ideation.  2. Rule out obsessive-compulsive disorder.  3. Rule out bipolar disorder.   AXIS II:  Deferred.   AXIS III:  None.   AXIS IV:  Problems with primary support group and occupation.   AXIS V:  Current is 30, this past year is 80-85.   INITIAL PLAN OF CARE:  Plan is a voluntary admission to [facility] for depression and suicidal ideation.  Contract for safety, check  every 15 minutes.  Will obtain labs.  Will resume her Zoloft and Xanax.  Treatment plan was discussed with Dr. Dub Mikes.  Notified of blurred vision.  The patient is to follow-up with Dr. Evelene Croon.   Plan is to decrease depressive  symptoms so the patient can be safe, to return to prior living arrangement.   ESTIMATED LENGTH OF STAY:  Three to five days.      Landry Corporal, N.P.                       [id number]    JO/MEDQ  D:  05/02/2002  T:  05/02/2002  Job:  [id number]

## 2011-01-03 NOTE — H&P (Signed)
NAME:  [name], [name]                        ACCOUNT NO.:  [id number]   MEDICAL RECORD NO.:  [id number]                   PATIENT TYPE:  IPS   LOCATION:  0501                                 FACILITY:  [facility]   PHYSICIAN:  Geoffery Lyons, M.D.                   DATE OF BIRTH:  08-02-1971   DATE OF ADMISSION:  02/10/2003  DATE OF DISCHARGE:                         PSYCHIATRIC ADMISSION ASSESSMENT   IDENTIFYING INFORMATION:  This is a voluntary admission.  This is a 40-year-  old married female and the information comes from the patient and  accompanying records.   REASON FOR ADMISSION AND SYMPTOMS:  The patient apparently disclosed  suicidal ideation with a plan to overdose or wreck her car.  She states she  has been depressed on and off for the past 1-1/2 years.  She saw [name] on Thursday, June 24, was advised by [name] that the Effexor  that she is prescribed, 150 mg p.o. daily, would take awhile to reach full  effectiveness and to be patient.  Apparently, the patient was talking with  her friend, a nurse who works for her primary physician, [name].  Her  friend became concerned in talking with [name] and put [name] on the  phone and [name] on the phone, and [name] thought that the patient  should be hospitalized.  The patient today states that she is no longer  suicidal, however she is labile, breaks into tears, wants to go home.  Her  apparent sadness, she does look dispirited at times but she can brighten up  without difficulty.  She reports occasional sadness in keeping with  circumstances at home.  She does have occasional feelings of edginess and  ill-defined discomfort.  She denies any problems with her sleep.  She denies  any problems with her appetite.  She does have some difficulty in collecting  her thoughts.  She does not have difficulty in starting her activities.  She  has normal interest in surroundings and in other people.  She  reports no  difficulty in caring for her children.  She has fluctuating ideas to purge  herself with defecation and she reports that she would probably be better  off dead.  Suicidal thoughts are somewhat common.  It is considered as a  possible solution and she has still defined plans to overdose on drugs or  wreck her car.   The patient was administered a mood disorders questionnaire which is not  indicative for bipolar; however her prior admission in March of 2003 was  interesting in that she had had a strep throat with the beginning of  psychiatric symptoms being present after having had the strep throat.  She  also had substantial weight loss at that time of 36 pounds.  She was begun  on Zoloft and it states in her admission that she was better off  taking her  Zoloft, feeling great like she was on a high, and then she got worse than  ever.   PAST PSYCHIATRIC HISTORY:  The patient states that her first admission was  in March of 2003.  She had recently begun taking Zoloft under the direction  of [name].  Within days of starting her medication apparently she was  admitted to the hospital.  She stopped her medications 5 months later in  August because she was pregnant.  She gave birth in November of 2003.  Her  symptoms began to escalate 2 to 3 weeks after giving birth, and she cannot  remember exactly when she was restarted on Effexor.   SOCIAL HISTORY:  The patient has a college degree.  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  She has been married almost 10  years, has 3 sons, ages 50, 2 and 7 months.  She has friends and she does  attend church.   FAMILY HISTORY:  Her maternal grandmother had depression.  Maternal side of  the family had extensive alcohol abuse.   ALCOHOL AND DRUG ABUSE:  The patient denies for herself.   PAST MEDICAL HISTORY:  Primary care [name] is [name].  Medical  problems:  She states she has none.  She is currently  prescribed Effexor 150  mg p.o. daily and Xanax 0.5 mg q.6h p.r.n.   ALLERGIES:  No known drug allergies.   POSITIVE PHYSICAL FINDINGS:  Revealed a well-nourished, well-developed white  female in no acute distress.  She was of normal weight.  Lungs:  Clear to auscultation and percussion.  HEART:  Regular rate and rhythm without murmurs, rubs or gallops.  ABDOMEN:  Soft, nontender.  MUSCULOSKELETAL SYSTEM:  Reveals no clubbing, cyanosis, edema.  NEUROLOGICALLY:  Cranial nerves II-XII were grossly intact   MENTAL STATUS EXAM:  She is alert and oriented x3.  She was well groomed,  she was neat, she was cooperative.  Her speech was not pressured.  There was  no psychotic content noted.  Her mood was somewhat labile.  She cries  easily.  She acknowledges being depressed 4/10, 10 being the worst.  Her  thought processes were clear, specifically she denies any suicidal or  homicidal ideation.  Her memory and concentration were intact.  Her judgment  and insight were fair.  Her intelligence level is average to above average.   ADMISSION DIAGNOSES:   AXIS I:  Major depressive disorder, severe, recurrent.   AXIS II:  Deferred.   AXIS III:  None.   AXIS IV:  Relationship issues with husband.   AXIS V:  Global assessment of function is 20.   PLAN:  The patient is admitted for safety, to enhance her medications,  consider adding lithium for suicidal ideation.  The patient is nursing.  She  is concerned about transfer of medication in her breast milk.  The patient  states that really her issues revolve around her relationship with her  husband who does not feel that he has to contribute to taking care of the  children and the patient is overwhelmed.  A family session with the social  worker, [name], will be obtained as soon as possible.  The patient  wishes to sign a 72-hour discharge and she will see [name] as soon as possible.   ESTIMATED LENGTH OF STAY:  At least 72 hours,  hopefully we will identify  some support and  therapy within the community for the patient.      [name], P.A.-C.               Geoffery Lyons, M.D.    MD/MEDQ  D:  02/11/2003  T:  02/11/2003  Job:  [id number]

## 2011-01-03 NOTE — Discharge Summary (Signed)
[facility]  Patient:    [name], [name] Visit Number: [id number] MRN: [id number]          Service Type: PSY Location: 500 0507 02 Attending Physician:  Jeanice Lim Dictated by:   Jasmine Pang, M.D. Admit Date:  10/21/2001 Discharge Date: 10/23/2001   CC:         Dr. Evelene Croon   Discharge Summary  BRIEF HISTORY AND REASON FOR ADMISSION:  The patient was a 40 year old married female, who was admitted on a voluntary basis for depression and suicidal ideation.  She presents with a history of depression for the past two months with fatigue, anhedonia and feelings of hopelessness, worthlessness and social isolation.  She has begun to have suicidal ideation.  She has had numerous medical problems including colitis and strep throat.  She has had a decrease in weight of 36 pounds over the past several months.  She states she was treated with Zoloft in the past and it made her feel good, though at times it was "like on a high."  The patient currently sees Dr. Evelene Croon for medication management.  She is on Zoloft 150 mg q.d. and Xanax.  She has no known drug allergies and no other medical problems than those listed above.  PHYSICAL EXAMINATION:  See physical examination by the nurse practitioner in the chart.  LABORATORY DATA:  On admission, CBC was within normal limits.  Routine chemistry panel was within normal limits except for a slightly decreased potassium of 3.3 (3.5-5.5).  Hypothyroid panel was grossly within normal limits.  Urine drug screen was positive for benzodiazepines (patient takes Xanax).  Urinalysis was grossly within normal limits.  HOSPITAL COURSE:  Upon admission, patient was continued on Zoloft 150 mg q.d. and Xanax 0.5 mg p.o. t.i.d. p.r.n. anxiety as well as Ambien 10 mg p.o. q.h.s. p.r.n. insomnia.  On October 22, 2001, Zoloft 150 mg was changed to be given at bedtime.  The patient tolerated her medications well with no significant  side effects.  There was an improvement in her mental status.  She was able to participate appropriately in unit therapeutic groups and activities.  At the time of discharge, her mental status had improved to the point that she was less depressed and anxious.  She was not suicidal.  She was not homicidal.  There was no psychosis.  Thought processes were logical and goal directed.  Her cognitive functioning was within normal limits.  DISCHARGE DIAGNOSES: Axis I:    1. Major depression, recurrent, severe without psychosis (features               of bipolar disorder).            2. Rule out obsessive-compulsive disorder. Axis II:   None. Axis III:  1. Recent history of colitis.            2. Strep throat, resolved. Axis IV:   Severe. Axis V:    Global Assessment of Functioning upon admission 30; upon discharge            55; highest past year 80-85.  DISCHARGE MEDICATIONS: 1. Zoloft 150 mg q.h.s. 2. Xanax 0.5 mg p.o. q.i.d. p.r.n. anxiety.  ACTIVITY LEVEL:  No restrictions.  DIET:  No restrictions.  POST-HOSPITAL CARE PLANS:  Return to see Dr. Evelene Croon for follow-up psychiatric treatment. Dictated by:   Jasmine Pang, M.D. Attending Physician:  Jeanice Lim DD:  12/17/01 TD:  12/18/01 Job: [id number]

## 2011-05-12 LAB — POCT PREGNANCY, URINE
Operator id: 26520
Preg Test, Ur: NEGATIVE

## 2011-05-12 LAB — I-STAT 8, (EC8 V) (CONVERTED LAB)
Acid-base deficit: 3 — ABNORMAL HIGH
BUN: 9
Bicarbonate: 21.5
Chloride: 108
Glucose, Bld: 99
HCT: 38
Hemoglobin: 12.9
Operator id: 265201
Potassium: 4.1
Sodium: 139
TCO2: 23
pCO2, Ven: 37.5 — ABNORMAL LOW
pH, Ven: 7.367 — ABNORMAL HIGH

## 2011-05-12 LAB — POCT I-STAT CREATININE
Creatinine, Ser: 1.2
Operator id: 265201

## 2012-03-10 ENCOUNTER — Other Ambulatory Visit: Payer: Self-pay | Admitting: Obstetrics and Gynecology

## 2013-06-29 ENCOUNTER — Ambulatory Visit (INDEPENDENT_AMBULATORY_CARE_PROVIDER_SITE_OTHER): Payer: BC Managed Care – PPO | Admitting: Family Medicine

## 2013-06-29 ENCOUNTER — Ambulatory Visit: Payer: BC Managed Care – PPO

## 2013-06-29 DIAGNOSIS — M25572 Pain in left ankle and joints of left foot: Secondary | ICD-10-CM

## 2013-06-29 DIAGNOSIS — M25579 Pain in unspecified ankle and joints of unspecified foot: Secondary | ICD-10-CM

## 2013-06-29 DIAGNOSIS — M25473 Effusion, unspecified ankle: Secondary | ICD-10-CM

## 2013-06-29 DIAGNOSIS — M722 Plantar fascial fibromatosis: Secondary | ICD-10-CM

## 2013-06-29 DIAGNOSIS — M064 Inflammatory polyarthropathy: Secondary | ICD-10-CM

## 2013-06-29 DIAGNOSIS — M1990 Unspecified osteoarthritis, unspecified site: Secondary | ICD-10-CM

## 2013-06-29 LAB — POCT CBC
Granulocyte percent: 60 %G (ref 37–80)
HCT, POC: 38.1 % (ref 37.7–47.9)
Hemoglobin: 11.2 g/dL — AB (ref 12.2–16.2)
Lymph, poc: 3.1 (ref 0.6–3.4)
MCH, POC: 28.7 pg (ref 27–31.2)
MCHC: 29.4 g/dL — AB (ref 31.8–35.4)
MCV: 97.8 fL — AB (ref 80–97)
MID (cbc): 0.7 (ref 0–0.9)
MPV: 11.2 fL (ref 0–99.8)
POC Granulocyte: 5.6 (ref 2–6.9)
POC LYMPH PERCENT: 32.6 %L (ref 10–50)
POC MID %: 7.4 %M (ref 0–12)
Platelet Count, POC: 192 10*3/uL (ref 142–424)
RBC: 3.9 M/uL — AB (ref 4.04–5.48)
RDW, POC: 14 %
WBC: 9.4 10*3/uL (ref 4.6–10.2)

## 2013-06-29 MED ORDER — METHYLPREDNISOLONE 4 MG PO KIT: PACK | ORAL | Status: DC

## 2013-06-29 MED ORDER — HYDROCODONE-ACETAMINOPHEN 5-325 MG PO TABS: 1.0000 | ORAL_TABLET | Freq: Four times a day (QID) | ORAL | Status: DC | PRN

## 2013-06-29 NOTE — Patient Instructions (Signed)
Plantar Fasciitis (Heel Spur Syndrome)  with Rehab  The plantar fascia is a fibrous, ligament-like, soft-tissue structure that spans the bottom of the foot. Plantar fasciitis is a condition that causes pain in the foot due to inflammation of the tissue.  SYMPTOMS   · Pain and tenderness on the underneath side of the foot.  · Pain that worsens with standing or walking.  CAUSES   Plantar fasciitis is caused by irritation and injury to the plantar fascia on the underneath side of the foot. Common mechanisms of injury include:  · Direct trauma to bottom of the foot.  · Damage to a small nerve that runs under the foot where the main fascia attaches to the heel bone.  · Stress placed on the plantar fascia due to bone spurs.  RISK INCREASES WITH:   · Activities that place stress on the plantar fascia (running, jumping, pivoting, or cutting).  · Poor strength and flexibility.  · Improperly fitted shoes.  · Tight calf muscles.  · Flat feet.  · Failure to warm-up properly before activity.  · Obesity.  PREVENTION  · Warm up and stretch properly before activity.  · Allow for adequate recovery between workouts.  · Maintain physical fitness:  · Strength, flexibility, and endurance.  · Cardiovascular fitness.  · Maintain a health body weight.  · Avoid stress on the plantar fascia.  · Wear properly fitted shoes, including arch supports for individuals who have flat feet.  PROGNOSIS   If treated properly, then the symptoms of plantar fasciitis usually resolve without surgery. However, occasionally surgery is necessary.  RELATED COMPLICATIONS   · Recurrent symptoms that may result in a chronic condition.  · Problems of the lower back that are caused by compensating for the injury, such as limping.  · Pain or weakness of the foot during push-off following surgery.  · Chronic inflammation, scarring, and partial or complete fascia tear, occurring more often from repeated injections.  TREATMENT   Treatment initially involves the use of  ice and medication to help reduce pain and inflammation. The use of strengthening and stretching exercises may help reduce pain with activity, especially stretches of the Achilles tendon. These exercises may be performed at home or with a therapist. Your caregiver may recommend that you use heel cups of arch supports to help reduce stress on the plantar fascia. Occasionally, corticosteroid injections are given to reduce inflammation. If symptoms persist for greater than 6 months despite non-surgical (conservative), then surgery may be recommended.   MEDICATION   · If pain medication is necessary, then nonsteroidal anti-inflammatory medications, such as aspirin and ibuprofen, or other minor pain relievers, such as acetaminophen, are often recommended.  · Do not take pain medication within 7 days before surgery.  · Prescription pain relievers may be given if deemed necessary by your caregiver. Use only as directed and only as much as you need.  · Corticosteroid injections may be given by your caregiver. These injections should be reserved for the most serious cases, because they may only be given a certain number of times.  HEAT AND COLD  · Cold treatment (icing) relieves pain and reduces inflammation. Cold treatment should be applied for 10 to 15 minutes every 2 to 3 hours for inflammation and pain and immediately after any activity that aggravates your symptoms. Use ice packs or massage the area with a piece of ice (ice massage).  · Heat treatment may be used prior to performing the stretching and strengthening activities prescribed   by your caregiver, physical therapist, or athletic trainer. Use a heat pack or soak the injury in warm water.  SEEK IMMEDIATE MEDICAL CARE IF:  · Treatment seems to offer no benefit, or the condition worsens.  · Any medications produce adverse side effects.  EXERCISES  RANGE OF MOTION (ROM) AND STRETCHING EXERCISES - Plantar Fasciitis (Heel Spur Syndrome)  These exercises may help you  when beginning to rehabilitate your injury. Your symptoms may resolve with or without further involvement from your physician, physical therapist or athletic trainer. While completing these exercises, remember:   · Restoring tissue flexibility helps normal motion to return to the joints. This allows healthier, less painful movement and activity.  · An effective stretch should be held for at least 30 seconds.  · A stretch should never be painful. You should only feel a gentle lengthening or release in the stretched tissue.  RANGE OF MOTION - Toe Extension, Flexion  · Sit with your right / left leg crossed over your opposite knee.  · Grasp your toes and gently pull them back toward the top of your foot. You should feel a stretch on the bottom of your toes and/or foot.  · Hold this stretch for __________ seconds.  · Now, gently pull your toes toward the bottom of your foot. You should feel a stretch on the top of your toes and or foot.  · Hold this stretch for __________ seconds.  Repeat __________ times. Complete this stretch __________ times per day.   RANGE OF MOTION - Ankle Dorsiflexion, Active Assisted  · Remove shoes and sit on a chair that is preferably not on a carpeted surface.  · Place right / left foot under knee. Extend your opposite leg for support.  · Keeping your heel down, slide your right / left foot back toward the chair until you feel a stretch at your ankle or calf. If you do not feel a stretch, slide your bottom forward to the edge of the chair, while still keeping your heel down.  · Hold this stretch for __________ seconds.  Repeat __________ times. Complete this stretch __________ times per day.   STRETCH  Gastroc, Standing  · Place hands on wall.  · Extend right / left leg, keeping the front knee somewhat bent.  · Slightly point your toes inward on your back foot.  · Keeping your right / left heel on the floor and your knee straight, shift your weight toward the wall, not allowing your back to  arch.  · You should feel a gentle stretch in the right / left calf. Hold this position for __________ seconds.  Repeat __________ times. Complete this stretch __________ times per day.  STRETCH  Soleus, Standing  · Place hands on wall.  · Extend right / left leg, keeping the other knee somewhat bent.  · Slightly point your toes inward on your back foot.  · Keep your right / left heel on the floor, bend your back knee, and slightly shift your weight over the back leg so that you feel a gentle stretch deep in your back calf.  · Hold this position for __________ seconds.  Repeat __________ times. Complete this stretch __________ times per day.  STRETCH  Gastrocsoleus, Standing   Note: This exercise can place a lot of stress on your foot and ankle. Please complete this exercise only if specifically instructed by your caregiver.   · Place the ball of your right / left foot on a step, keeping   your other foot firmly on the same step.  · Hold on to the wall or a rail for balance.  · Slowly lift your other foot, allowing your body weight to press your heel down over the edge of the step.  · You should feel a stretch in your right / left calf.  · Hold this position for __________ seconds.  · Repeat this exercise with a slight bend in your right / left knee.  Repeat __________ times. Complete this stretch __________ times per day.   STRENGTHENING EXERCISES - Plantar Fasciitis (Heel Spur Syndrome)   These exercises may help you when beginning to rehabilitate your injury. They may resolve your symptoms with or without further involvement from your physician, physical therapist or athletic trainer. While completing these exercises, remember:   · Muscles can gain both the endurance and the strength needed for everyday activities through controlled exercises.  · Complete these exercises as instructed by your physician, physical therapist or athletic trainer. Progress the resistance and repetitions only as guided.  STRENGTH - Towel  Curls  · Sit in a chair positioned on a non-carpeted surface.  · Place your foot on a towel, keeping your heel on the floor.  · Pull the towel toward your heel by only curling your toes. Keep your heel on the floor.  · If instructed by your physician, physical therapist or athletic trainer, add ____________________ at the end of the towel.  Repeat __________ times. Complete this exercise __________ times per day.  STRENGTH - Ankle Inversion  · Secure one end of a rubber exercise band/tubing to a fixed object (table, pole). Loop the other end around your foot just before your toes.  · Place your fists between your knees. This will focus your strengthening at your ankle.  · Slowly, pull your big toe up and in, making sure the band/tubing is positioned to resist the entire motion.  · Hold this position for __________ seconds.  · Have your muscles resist the band/tubing as it slowly pulls your foot back to the starting position.  Repeat __________ times. Complete this exercises __________ times per day.   Document Released: 08/04/2005 Document Revised: 10/27/2011 Document Reviewed: 11/16/2008  [employer]® Patient Information ©2014 [employer].

## 2013-06-29 NOTE — Progress Notes (Addendum)
Subjective:    Patient ID: [name], female    DOB: Feb 19, 1971, 42 y.o.   MRN: [id number] Chief Complaint  Patient presents with  . Foot Pain    (L) foot x 1 week    HPI Left foot and ankle began hurting and swelling 10d ago - no known inj. Tried RICE but has been getting worse - had to take ibuprofen 400mg  6 times today just to make it through the day. 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   Having severe pain on the base of heel - pain worst when she first starts walking - so is much worse first thing in the morning or after sitting for a long time. Pain radiates throughout arch and plantar foot.  PMHx of bipolar - Was recently switched to Abilify and then started on cymbalta about one month ago followed shortly by muscle aches throughout all upper and lower legs 1-2 wks later which then seemed to localize in joints and then left bottom of foot pain and ankle swelling began.   Mom has a hx of nerve damage and back surgery resulting in Charcot-Marie-Tooth syndrome.   Past Medical History  Diagnosis Date  . Bipolar disorder    No current outpatient prescriptions on file prior to visit.   No current facility-administered medications on file prior to visit.   No Known Allergies  History reviewed. No pertinent family history.   Review of Systems  Constitutional: Negative for fever, chills, activity change and unexpected weight change.  Musculoskeletal: Positive for arthralgias, gait problem, joint swelling and myalgias. Negative for back pain.  Skin: Negative for color change, pallor, rash and wound.  Neurological: Negative for weakness and numbness.      BP 122/80  Pulse 83  Temp(Src) 98.2 F (36.8 C) (Oral)  Resp 16  Ht 5' 7.5" (1.715 m)  Wt 198 lb (89.812 kg)  BMI 30.54 kg/m2  SpO2 100%  LMP 06/26/2013 Objective:   Physical Exam   Constitutional: She is oriented to person, place, and time. She appears well-developed and well-nourished. No distress.  HENT:  Head: Normocephalic and atraumatic.  Right Ear: External ear normal.  Eyes: Conjunctivae are normal. No scleral icterus.  Cardiovascular:  Pulses:      Dorsalis pedis pulses are 2+ on the left side.  PT pulse not palp due to edema  Pulmonary/Chest: Effort normal.  Musculoskeletal:       Right ankle: Normal.       Left ankle: She exhibits swelling. She exhibits normal range of motion, no ecchymosis, no laceration and normal pulse. Tenderness. Posterior TFL tenderness found. No lateral malleolus, no medial malleolus, no AITFL, no CF ligament, no head of 5th metatarsal and no proximal fibula tenderness found.  No pain to palpation over plantar calcaneous though this is where most of pain is with walking. 2+ edema/swelling of medial and lateral ankle  Neurological: She is alert and oriented to person, place, and time.  Skin: Skin is warm and dry. She is not diaphoretic. No erythema.  Psychiatric: She has a normal mood and affect. Her behavior is normal.    Results for orders placed in visit on 06/29/13  POCT CBC      Result Value Range   WBC 9.4  4.6 - 10.2 K/uL   Lymph, poc 3.1  0.6 - 3.4  POC LYMPH PERCENT 32.6  10 - 50 %L   MID (cbc) 0.7  0 - 0.9   POC MID % 7.4  0 - 12 %M   POC Granulocyte 5.6  2 - 6.9   Granulocyte percent 60.0  37 - 80 %G   RBC 3.90 (*) 4.04 - 5.48 M/uL   Hemoglobin 11.2 (*) 12.2 - 16.2 g/dL   HCT, POC 16.1  09.6 - 47.9 %   MCV 97.8 (*) 80 - 97 fL   MCH, POC 28.7  27 - 31.2 pg   MCHC 29.4 (*) 31.8 - 35.4 g/dL   RDW, POC 04.5     Platelet Count, POC 192  142 - 424 K/uL   MPV 11.2  0 - 99.8 fL     [facility] reading (PRIMARY) by  Dr. Clelia Croft. Left ankle: no acute abnormality Assessment & Plan:  Swelling and pain of joint of ankle or foot, left - Plan: POCT CBC, DG Os Calcis Left, DG Ankle Complete Left, Uric acid, Comprehensive metabolic  panel, Sedimentation Rate, C-reactive protein  Plantar fasciitis of left foot - Pt's pain c/o are c/w plantar fasciitis though this does not explain her ankle swelling or tenderness - however, rec aggressive icing and stretching of plantar fascia, gave handout w/ home exercises, rec shoe wear w/ excellent arch support  Inflammatory arthritis - swelling of ankle diffuse w/ no known injury so check inflammatory markers and try treatment w/ prednisone. If does not respond to prednisone and/or labs nml, can fill pain medication and cons referral to ortho for further eval.  Warned of pot side effects of steroid inc mood.  Doubt cymbalta reaction - no reports of cymbalta causing joint swelling found.  Meds ordered this encounter  Medications  . divalproex (DEPAKOTE) 500 MG DR tablet    Sig: Take 1,000 mg by mouth.  . DULoxetine HCl (CYMBALTA PO)    Sig: Take by mouth.  Marland Kitchen LORAZEPAM PO    Sig: Take by mouth.  . ARIPiprazole (ABILIFY) 20 MG tablet    Sig: Take 20 mg by mouth daily.  . Multiple Vitamin (MULTI-VITAMIN DAILY PO)    Sig: Take by mouth.  . methylPREDNISolone (MEDROL, PAK,) 4 MG tablet    Sig: follow package directions    Dispense:  21 tablet    Refill:  0  . HYDROcodone-acetaminophen (NORCO/VICODIN) 5-325 MG per tablet    Sig: Take 1 tablet by mouth every 6 (six) hours as needed for moderate pain.    Dispense:  15 tablet    Refill:  0    I personally performed the services described in this documentation, which was scribed in my presence. The recorded information has been reviewed and considered, and addended by me as needed.  Norberto Sorenson, MD MPH

## 2013-06-30 LAB — COMPREHENSIVE METABOLIC PANEL
ALT: 24 U/L (ref 0–35)
AST: 26 U/L (ref 0–37)
Albumin: 4 g/dL (ref 3.5–5.2)
Alkaline Phosphatase: 79 U/L (ref 39–117)
BUN: 9 mg/dL (ref 6–23)
CO2: 27 mEq/L (ref 19–32)
Calcium: 9.2 mg/dL (ref 8.4–10.5)
Chloride: 104 mEq/L (ref 96–112)
Creat: 0.8 mg/dL (ref 0.50–1.10)
Glucose, Bld: 99 mg/dL (ref 70–99)
Potassium: 4.3 mEq/L (ref 3.5–5.3)
Sodium: 143 mEq/L (ref 135–145)
Total Bilirubin: 0.3 mg/dL (ref 0.3–1.2)
Total Protein: 6.4 g/dL (ref 6.0–8.3)

## 2013-06-30 LAB — C-REACTIVE PROTEIN: CRP: <0.5 mg/dL (ref <0.60)

## 2013-06-30 LAB — SEDIMENTATION RATE: Sed Rate: 7 mm/hr (ref 0–22)

## 2013-06-30 LAB — URIC ACID: Uric Acid, Serum: 3.9 mg/dL (ref 2.4–7.0)

## 2013-07-06 ENCOUNTER — Encounter: Payer: Self-pay | Admitting: Family Medicine

## 2013-07-06 ENCOUNTER — Telehealth: Payer: Self-pay

## 2013-07-06 NOTE — Telephone Encounter (Signed)
PT IS GOING TO SEE ANOTHER DR AND NEED TO COME BY AND P/U HER XRAYS. PLEASE CALL [phone] WHEN READY FOR PICK UP

## 2013-07-06 NOTE — Telephone Encounter (Signed)
Request given to xray 

## 2013-07-12 ENCOUNTER — Emergency Department (HOSPITAL_BASED_OUTPATIENT_CLINIC_OR_DEPARTMENT_OTHER)
Admission: EM | Admit: 2013-07-12 | Discharge: 2013-07-12 | Disposition: A | Discharge status: Home / Self Care | Payer: BC Managed Care – PPO | Attending: Emergency Medicine | Admitting: Emergency Medicine

## 2013-07-12 ENCOUNTER — Encounter (HOSPITAL_BASED_OUTPATIENT_CLINIC_OR_DEPARTMENT_OTHER): Payer: Self-pay | Admitting: Emergency Medicine

## 2013-07-12 DIAGNOSIS — IMO0002 Reserved for concepts with insufficient information to code with codable children: Secondary | ICD-10-CM | POA: Insufficient documentation

## 2013-07-12 DIAGNOSIS — Z79899 Other long term (current) drug therapy: Secondary | ICD-10-CM | POA: Insufficient documentation

## 2013-07-12 DIAGNOSIS — M25579 Pain in unspecified ankle and joints of unspecified foot: Secondary | ICD-10-CM | POA: Insufficient documentation

## 2013-07-12 DIAGNOSIS — F17200 Nicotine dependence, unspecified, uncomplicated: Secondary | ICD-10-CM | POA: Insufficient documentation

## 2013-07-12 DIAGNOSIS — F319 Bipolar disorder, unspecified: Secondary | ICD-10-CM | POA: Insufficient documentation

## 2013-07-12 DIAGNOSIS — M79671 Pain in right foot: Secondary | ICD-10-CM

## 2013-07-12 DIAGNOSIS — M25571 Pain in right ankle and joints of right foot: Secondary | ICD-10-CM

## 2013-07-12 MED ORDER — IBUPROFEN 800 MG PO TABS: 800.0000 mg | ORAL_TABLET | Freq: Three times a day (TID) | ORAL | Status: DC

## 2013-07-12 NOTE — ED Provider Notes (Signed)
CSN: [id number]     Arrival date & time 07/12/13  4098 History   First MD Initiated Contact with Patient 07/12/13 0848     Chief Complaint  Patient presents with  . Foot Pain   (Consider location/radiation/quality/duration/timing/severity/associated sxs/prior Treatment) HPI Comments: 42 year old female presents with ankle pain. Started 3 weeks ago. No trauma noted. She was seen by her PCP who referred her to orthopedics. Orthopedics states that they think it's likely an inflammatory condition and give her anti-inflammatories. She has a family history of Charcot-Marie-Tooth disease, which she discussed with orthopedist. Orthopedist is not feel this is Charcot-Marie-Tooth disease. She has no fevers, vomiting, nausea, diarrhea.   Patient is a 42 y.o. female presenting with lower extremity pain. The history is provided by the patient.  Foot Pain This is a new problem. The current episode started more than 1 week ago. The problem occurs constantly. The problem has been gradually worsening. Pertinent negatives include no chest pain, no abdominal pain and no shortness of breath. Nothing aggravates the symptoms. Nothing relieves the symptoms. She has tried a cold compress, acetaminophen, rest and a warm compress for the symptoms. The treatment provided no relief.    Past Medical History  Diagnosis Date  . Bipolar disorder    Past Surgical History  Procedure Laterality Date  . Gallbladder surgery  2003  . Eye surgery     No family history on file. History  Substance Use Topics  . Smoking status: Current Every Day Smoker  . Smokeless tobacco: Not on file  . Alcohol Use: Yes     Comment: occ   OB History   Grav Para Term Preterm Abortions TAB SAB Ect Mult Living                 Review of Systems  Constitutional: Negative for fever.  Respiratory: Negative for cough and shortness of breath.   Cardiovascular: Negative for chest pain.  Gastrointestinal: Negative for vomiting and abdominal  pain.  All other systems reviewed and are negative.    Allergies  Ambien  Home Medications   Current Outpatient Rx  Name  Route  Sig  Dispense  Refill  . ARIPiprazole (ABILIFY) 20 MG tablet   Oral   Take 20 mg by mouth daily.         . divalproex (DEPAKOTE) 500 MG DR tablet   Oral   Take 1,000 mg by mouth.         . DULoxetine HCl (CYMBALTA PO)   Oral   Take by mouth.         Marland Kitchen HYDROcodone-acetaminophen (NORCO/VICODIN) 5-325 MG per tablet   Oral   Take 1 tablet by mouth every 6 (six) hours as needed for moderate pain.   15 tablet   0   . LORAZEPAM PO   Oral   Take by mouth.         . Multiple Vitamin (MULTI-VITAMIN DAILY PO)   Oral   Take by mouth.         Marland Kitchen ibuprofen (ADVIL,MOTRIN) 800 MG tablet   Oral   Take 1 tablet (800 mg total) by mouth 3 (three) times daily.   21 tablet   0   . methylPREDNISolone (MEDROL, PAK,) 4 MG tablet      follow package directions   21 tablet   0    BP 110/62  Pulse 87  Temp(Src) 97.7 F (36.5 C) (Oral)  Resp 16  Ht 5\' 5"  (1.651 m)  Wt 200  lb (90.719 kg)  BMI 33.28 kg/m2  SpO2 98%  LMP 07/03/2013 Physical Exam  Nursing note and vitals reviewed. Constitutional: She is oriented to person, place, and time. She appears well-developed and well-nourished. No distress.  HENT:  Head: Normocephalic and atraumatic.  Mouth/Throat: Oropharynx is clear and moist.  Eyes: EOM are normal. Pupils are equal, round, and reactive to light.  Neck: Normal range of motion. Neck supple.  Cardiovascular: Normal rate and regular rhythm.  Exam reveals no friction rub.   No murmur heard. Pulmonary/Chest: Effort normal and breath sounds normal. No respiratory distress. She has no wheezes. She has no rales.  Abdominal: Soft. She exhibits no distension. There is no tenderness. There is no rebound.  Musculoskeletal: Normal range of motion. She exhibits no edema.       Right ankle: She exhibits swelling (mild, posterior). She exhibits  normal range of motion, no ecchymosis and no deformity. No tenderness.       Left ankle: She exhibits swelling (mild, posterior). She exhibits normal range of motion, no ecchymosis and no deformity. No tenderness.  Neurological: She is alert and oriented to person, place, and time.  Skin: She is not diaphoretic.    ED Course  Procedures (including critical care time) Labs Review Labs Reviewed - No data to display Imaging Review No results found.  EKG Interpretation   None       MDM   1. Foot pain, bilateral   2. Bilateral ankle pain    42 year old female presents with bilateral ankle and foot pain. Similar to recent she seen orthopedics who thought it was some type of tendinitis. She does not have any systemic symptoms. Her vitals are stable here. She has mild swelling of posterior bilateral ankles. She has normal range of motion. She has no gross tenderness I could find. His normal pulses and cap refill and urged bilateral feet. There is no effusion in either ankle. Do not feel this is consistent with septic arthritis. She likely has some arthritis or other kind of inflammatory tendinopathy. Instructed to followup with PCP, or so, and possible rheumatology to    Dagmar Hait, MD 07/12/13 [phone]

## 2013-07-12 NOTE — ED Notes (Signed)
Pt states has had foot pain for three weeks. Was told by ortho doctor that it was tendonitis. Pt denies injury

## 2013-09-07 ENCOUNTER — Other Ambulatory Visit: Payer: Self-pay | Admitting: Obstetrics and Gynecology

## 2014-10-23 ENCOUNTER — Encounter (HOSPITAL_BASED_OUTPATIENT_CLINIC_OR_DEPARTMENT_OTHER): Payer: Self-pay | Admitting: *Deleted

## 2014-10-23 ENCOUNTER — Emergency Department (HOSPITAL_BASED_OUTPATIENT_CLINIC_OR_DEPARTMENT_OTHER): Payer: BLUE CROSS/BLUE SHIELD

## 2014-10-23 ENCOUNTER — Emergency Department (HOSPITAL_BASED_OUTPATIENT_CLINIC_OR_DEPARTMENT_OTHER)
Admission: EM | Admit: 2014-10-23 | Discharge: 2014-10-23 | Disposition: A | Discharge status: Home / Self Care | Payer: BLUE CROSS/BLUE SHIELD | Attending: Emergency Medicine | Admitting: Emergency Medicine

## 2014-10-23 DIAGNOSIS — Z79899 Other long term (current) drug therapy: Secondary | ICD-10-CM | POA: Diagnosis not present

## 2014-10-23 DIAGNOSIS — F319 Bipolar disorder, unspecified: Secondary | ICD-10-CM | POA: Diagnosis not present

## 2014-10-23 DIAGNOSIS — R079 Chest pain, unspecified: Secondary | ICD-10-CM | POA: Diagnosis not present

## 2014-10-23 DIAGNOSIS — Z3202 Encounter for pregnancy test, result negative: Secondary | ICD-10-CM | POA: Diagnosis not present

## 2014-10-23 DIAGNOSIS — R591 Generalized enlarged lymph nodes: Secondary | ICD-10-CM | POA: Diagnosis not present

## 2014-10-23 DIAGNOSIS — R911 Solitary pulmonary nodule: Secondary | ICD-10-CM

## 2014-10-23 DIAGNOSIS — Z720 Tobacco use: Secondary | ICD-10-CM | POA: Diagnosis not present

## 2014-10-23 DIAGNOSIS — Z7952 Long term (current) use of systemic steroids: Secondary | ICD-10-CM | POA: Insufficient documentation

## 2014-10-23 LAB — URINALYSIS, ROUTINE W REFLEX MICROSCOPIC
BILIRUBIN URINE: NEGATIVE
GLUCOSE, UA: NEGATIVE mg/dL
KETONES UR: NEGATIVE mg/dL
Leukocytes, UA: NEGATIVE
Nitrite: NEGATIVE
Protein, ur: NEGATIVE mg/dL
SPECIFIC GRAVITY, URINE: 1.004 — AB (ref 1.005–1.030)
Urobilinogen, UA: 0.2 mg/dL (ref 0.0–1.0)
pH: 6 (ref 5.0–8.0)

## 2014-10-23 LAB — CBC WITH DIFFERENTIAL/PLATELET
BASOS ABS: 0 10*3/uL (ref 0.0–0.1)
BASOS PCT: 0 % (ref 0–1)
EOS ABS: 0.7 10*3/uL (ref 0.0–0.7)
EOS PCT: 5 % (ref 0–5)
HEMATOCRIT: 37.1 % (ref 36.0–46.0)
Hemoglobin: 12.1 g/dL (ref 12.0–15.0)
Lymphocytes Relative: 25 % (ref 12–46)
Lymphs Abs: 3.2 10*3/uL (ref 0.7–4.0)
MCH: 30.8 pg (ref 26.0–34.0)
MCHC: 32.6 g/dL (ref 30.0–36.0)
MCV: 94.4 fL (ref 78.0–100.0)
MONO ABS: 1.1 10*3/uL — AB (ref 0.1–1.0)
Monocytes Relative: 8 % (ref 3–12)
Neutro Abs: 7.9 10*3/uL — ABNORMAL HIGH (ref 1.7–7.7)
Neutrophils Relative %: 62 % (ref 43–77)
PLATELETS: 231 10*3/uL (ref 150–400)
RBC: 3.93 MIL/uL (ref 3.87–5.11)
RDW: 12.9 % (ref 11.5–15.5)
WBC: 12.8 10*3/uL — AB (ref 4.0–10.5)

## 2014-10-23 LAB — URINE MICROSCOPIC-ADD ON

## 2014-10-23 LAB — BASIC METABOLIC PANEL
ANION GAP: 3 — AB (ref 5–15)
BUN: 7 mg/dL (ref 6–23)
CHLORIDE: 106 mmol/L (ref 96–112)
CO2: 27 mmol/L (ref 19–32)
Calcium: 8.5 mg/dL (ref 8.4–10.5)
Creatinine, Ser: 0.71 mg/dL (ref 0.50–1.10)
Glucose, Bld: 96 mg/dL (ref 70–99)
POTASSIUM: 3.6 mmol/L (ref 3.5–5.1)
Sodium: 136 mmol/L (ref 135–145)

## 2014-10-23 LAB — TROPONIN I: Troponin I: <0.03 ng/mL (ref <0.031)

## 2014-10-23 LAB — PREGNANCY, URINE: PREG TEST UR: NEGATIVE

## 2014-10-23 MED ORDER — IOHEXOL 300 MG/ML  SOLN
80.0000 mL | Freq: Once | INTRAMUSCULAR | Status: AC | PRN
  Administered 2014-10-23: 80 mL via INTRAVENOUS

## 2014-10-23 NOTE — Discharge Instructions (Signed)
As discussed you need to have a repeat scan in a week. Chest Pain (Nonspecific) It is often hard to give a diagnosis for the cause of chest pain. There is always a chance that your pain could be related to something serious, such as a heart attack or a blood clot in the lungs. You need to follow up with your doctor. HOME CARE  If antibiotic medicine was given, take it as directed by your doctor. Finish the medicine even if you start to feel better.  For the next few days, avoid activities that bring on chest pain. Continue physical activities as told by your doctor.  Do not use any tobacco products. This includes cigarettes, chewing tobacco, and e-cigarettes.  Avoid drinking alcohol.  Only take medicine as told by your doctor.  Follow your doctor's suggestions for more testing if your chest pain does not go away.  Keep all doctor visits you made. GET HELP IF:  Your chest pain does not go away, even after treatment.  You have a rash with blisters on your chest.  You have a fever. GET HELP RIGHT AWAY IF:   You have more pain or pain that spreads to your arm, neck, jaw, back, or belly (abdomen).  You have shortness of breath.  You cough more than usual or cough up blood.  You have very bad back or belly pain.  You feel sick to your stomach (nauseous) or throw up (vomit).  You have very bad weakness.  You pass out (faint).  You have chills. This is an emergency. Do not wait to see if the problems will go away. Call your local emergency services (911 in [place] Do not drive yourself to the hospital. MAKE SURE YOU:   Understand these instructions.  Will watch your condition.  Will get help right away if you are not doing well or get worse. Document Released: 01/21/2008 Document Revised: 08/09/2013 Document Reviewed: 01/21/2008 [facility] Patient Information 2015 [place], [place]. This information is not intended to replace advice given to you by your health care provider.  Make sure you discuss any questions you have with your health care provider.  Lymphadenopathy Lymphadenopathy means "disease of the lymph glands." But the term is usually used to describe swollen or enlarged lymph glands, also called lymph nodes. These are the bean-shaped organs found in many locations including the neck, underarm, and groin. Lymph glands are part of the immune system, which fights infections in your body. Lymphadenopathy can occur in just one area of the body, such as the neck, or it can be generalized, with lymph node enlargement in several areas. The nodes found in the neck are the most common sites of lymphadenopathy. CAUSES When your immune system responds to germs (such as viruses or bacteria ), infection-fighting cells and fluid build up. This causes the glands to grow in size. Usually, this is not something to worry about. Sometimes, the glands themselves can become infected and inflamed. This is called lymphadenitis. Enlarged lymph nodes can be caused by many diseases:  Bacterial disease, such as strep throat or a skin infection.  Viral disease, such as a common cold.  Other germs, such as Lyme disease, tuberculosis, or sexually transmitted diseases.  Cancers, such as lymphoma (cancer of the lymphatic system) or leukemia (cancer of the white blood cells).  Inflammatory diseases such as lupus or rheumatoid arthritis.  Reactions to medications. Many of the diseases above are rare, but important. This is why you should see your caregiver if you  have lymphadenopathy. SYMPTOMS  Swollen, enlarged lumps in the neck, back of the head, or other locations.  Tenderness.  Warmth or redness of the skin over the lymph nodes.  Fever. DIAGNOSIS Enlarged lymph nodes are often near the source of infection. They can help health care providers diagnose your illness. For instance:  Swollen lymph nodes around the jaw might be caused by an infection in the mouth.  Enlarged glands  in the neck often signal a throat infection.  Lymph nodes that are swollen in more than one area often indicate an illness caused by a virus. Your caregiver will likely know what is causing your lymphadenopathy after listening to your history and examining you. Blood tests, x-rays, or other tests may be needed. If the cause of the enlarged lymph node cannot be found, and it does not go away by itself, then a biopsy may be needed. Your caregiver will discuss this with you. TREATMENT Treatment for your enlarged lymph nodes will depend on the cause. Many times the nodes will shrink to normal size by themselves, with no treatment. Antibiotics or other medicines may be needed for infection. Only take over-the-counter or prescription medicines for pain, discomfort, or fever as directed by your caregiver. HOME CARE INSTRUCTIONS Swollen lymph glands usually return to normal when the underlying medical condition goes away. If they persist, contact your health-care provider. He/she might prescribe antibiotics or other treatments, depending on the diagnosis. Take any medications exactly as prescribed. Keep any follow-up appointments made to check on the condition of your enlarged nodes. SEEK MEDICAL CARE IF:  Swelling lasts for more than two weeks.  You have symptoms such as weight loss, night sweats, fatigue, or fever that does not go away.  The lymph nodes are hard, seem fixed to the skin, or are growing rapidly.  Skin over the lymph nodes is red and inflamed. This could mean there is an infection. SEEK IMMEDIATE MEDICAL CARE IF:  Fluid starts leaking from the area of the enlarged lymph node.  You develop a fever of 102 F (38.9 C) or greater.  Severe pain develops (not necessarily at the site of a large lymph node).  You develop chest pain or shortness of breath.  You develop worsening abdominal pain. MAKE SURE YOU:  Understand these instructions.  Will watch your condition.  Will get help  right away if you are not doing well or get worse. Document Released: 05/13/2008 Document Revised: 12/19/2013 Document Reviewed: 05/13/2008 [facility] Patient Information 2015 [place], [place]. This information is not intended to replace advice given to you by your health care provider. Make sure you discuss any questions you have with your health care provider.

## 2014-10-23 NOTE — ED Provider Notes (Signed)
CSN: [id number]     Arrival date & time 10/23/14  1254 History   First MD Initiated Contact with Patient 10/23/14 1310     Chief Complaint  Patient presents with  . Chest Pain     (Consider location/radiation/quality/duration/timing/severity/associated sxs/prior Treatment) HPI Comments: Pt states that she has had a ache in the left side of her neck today which has not been at the same time as the cp  Patient is a 44 y.o. female presenting with chest pain. The history is provided by the patient. No language interpreter was used.  Chest Pain Pain location:  L chest Pain quality: tightness   Pain radiates to:  Does not radiate Pain radiates to the back: no   Pain severity:  Moderate Onset quality:  Sudden Duration:  3 days Timing:  Intermittent Chronicity:  New Context: breathing and movement   Relieved by:  Nothing Worsened by:  Nothing tried Associated symptoms: no cough, no diaphoresis, no fever, no heartburn, no nausea, no shortness of breath, no syncope, not vomiting and no weakness     Past Medical History  Diagnosis Date  . Bipolar disorder    Past Surgical History  Procedure Laterality Date  . Gallbladder surgery  2003  . Eye surgery     No family history on file. History  Substance Use Topics  . Smoking status: Current Every Day Smoker  . Smokeless tobacco: Not on file  . Alcohol Use: Yes     Comment: occ   OB History    No data available     Review of Systems  Constitutional: Negative for fever and diaphoresis.  Respiratory: Negative for cough and shortness of breath.   Cardiovascular: Positive for chest pain. Negative for syncope.  Gastrointestinal: Negative for heartburn, nausea and vomiting.  Neurological: Negative for weakness.  All other systems reviewed and are negative.     Allergies  Ambien  Home Medications   Prior to Admission medications   Medication Sig Start Date End Date Taking? Authorizing Provider  ziprasidone (GEODON) 60 MG  capsule Take 60 mg by mouth 2 (two) times daily with a meal.   Yes Historical Provider, MD  ARIPiprazole (ABILIFY) 20 MG tablet Take 20 mg by mouth daily.    Historical Provider, MD  divalproex (DEPAKOTE) 500 MG DR tablet Take 1,000 mg by mouth.    Historical Provider, MD  DULoxetine HCl (CYMBALTA PO) Take by mouth.    Historical Provider, MD  HYDROcodone-acetaminophen (NORCO/VICODIN) 5-325 MG per tablet Take 1 tablet by mouth every 6 (six) hours as needed for moderate pain. 06/29/13   Sherren Mocha, MD  ibuprofen (ADVIL,MOTRIN) 800 MG tablet Take 1 tablet (800 mg total) by mouth 3 (three) times daily. 07/12/13   Elwin Mocha, MD  LORAZEPAM PO Take by mouth.    Historical Provider, MD  methylPREDNISolone (MEDROL, PAK,) 4 MG tablet follow package directions 06/29/13   Sherren Mocha, MD  Multiple Vitamin (MULTI-VITAMIN DAILY PO) Take by mouth.    Historical Provider, MD   BP 106/52 mmHg  Pulse 73  Temp(Src) 98.4 F (36.9 C) (Oral)  Resp 18  Ht  (1.676 m)  Wt 220 lb (99.791 kg)  BMI 35.53 kg/m2  SpO2 98%  LMP 09/24/2014 Physical Exam  Constitutional: She is oriented to person, place, and time. She appears well-developed and well-nourished.  Cardiovascular: Normal rate and regular rhythm.   Pulmonary/Chest: Effort normal and breath sounds normal. She exhibits no tenderness.  Abdominal: Soft. Bowel sounds  are normal. There is no tenderness.  Lymphadenopathy:    She has cervical adenopathy.  Neurological: She is alert and oriented to person, place, and time.  Skin: Skin is warm and dry.  Psychiatric: She has a normal mood and affect.  Nursing note and vitals reviewed.   ED Course  Procedures (including critical care time) Labs Review Labs Reviewed  URINALYSIS, ROUTINE W REFLEX MICROSCOPIC - Abnormal; Notable for the following:    Specific Gravity, Urine 1.004 (*)    Hgb urine dipstick TRACE (*)    All other components within normal limits  BASIC METABOLIC PANEL - Abnormal; Notable  for the following:    Anion gap 3 (*)    All other components within normal limits  CBC WITH DIFFERENTIAL/PLATELET - Abnormal; Notable for the following:    WBC 12.8 (*)    Neutro Abs 7.9 (*)    Monocytes Absolute 1.1 (*)    All other components within normal limits  PREGNANCY, URINE  TROPONIN I  URINE MICROSCOPIC-ADD ON    Imaging Review Dg Chest 2 View  10/23/2014   CLINICAL DATA:  Left chest pain for several days. Left neck pain starting today.  EXAM: CHEST  2 VIEW  COMPARISON:  05/13/2009  FINDINGS: Smoothly marginated 9 mm pleural-based density in the left mid chest peripherally, along the medial margin of the lateral portion of the left sixth rib.  The lungs appear otherwise clear. Cardiac and mediastinal margins appear normal. No pleural effusion identified.  IMPRESSION: 1. Smooth pleural thickening in the left mid chest appears to be new compared to 9/20 01/2009. Although quite likely incidental and benign, in the setting of left-sided chest pain this may warrant chest CT for further workup.   Electronically Signed   By: Gaylyn RongWalter  Liebkemann M.D.   On: 10/23/2014 13:39   Ct Chest W Contrast  10/23/2014   CLINICAL DATA:  Left lateral chest pain. Left neck pain. Tobacco use.  EXAM: CT CHEST WITH CONTRAST  TECHNIQUE: Multidetector CT imaging of the chest was performed during intravenous contrast administration.  CONTRAST:  80mL OMNIPAQUE IOHEXOL 300 MG/ML  SOLN  COMPARISON:  10/23/2014  FINDINGS: Mediastinum/Nodes: No pathologic thoracic adenopathy. Distal esophageal circumferential wall thickening.  Lungs/Pleura: Trace left pleural effusion accounting for the smooth pleural thickening observed on chest radiography. This is too small to accurately characterize as transudative or exudative. Minimal bilateral dependent subsegmental atelectasis. A 3 mm pulmonary nodule in the left upper lobe, image 16 series 3.  Upper abdomen: Cholecystectomy.  Musculoskeletal: Unremarkable  IMPRESSION: 1. The pleural  thickening shown on chest radiography is due to a trace left pleural effusion. No specific cause for this pleural effusion is identified. 2. Minimal dependent subsegmental atelectasis in both lower lobes. 3. Circumferential wall thickening in the distal esophagus, nonspecific but potentially reflecting esophagitis. 4. 3 mm left upper lobe pulmonary nodule. If the patient is at high risk for bronchogenic carcinoma, follow-up chest CT at 1 year is recommended. If the patient is at low risk, no follow-up is needed. This recommendation follows the consensus statement: Guidelines for Management of Small Pulmonary Nodules Detected on CT Scans: A Statement from the Fleischner Society as published in Radiology 2005; 237:395-400.   Electronically Signed   By: Gaylyn RongWalter  Liebkemann M.D.   On: 10/23/2014 15:00     EKG Interpretation   Date/Time:  Monday October 23 2014 13:14:14 EST Ventricular Rate:  76 PR Interval:  124 QRS Duration: 78 QT Interval:  378 QTC Calculation: 425 R  Axis:   51 Text Interpretation:  Sinus rhythm Otherwise normal ECG Confirmed by DELOS   MD, DOUGLAS ([id number]) on 10/23/2014 1:18:04 PM      MDM   Final diagnoses:  Chest pain  Pulmonary nodule  Lymphadenopathy    Doubt cardiac in nature. Pt is okay to follow up with pcp. Pt is a smoker so she will need a ct in the next year. Discussed findings with pt and return precautions. Pt verbalized understading    Teressa Lower, NP 10/23/14 1613  Geoffery Lyons, MD 10/26/14 [phone]

## 2014-10-23 NOTE — ED Notes (Signed)
Pt c/o left midaxilla chest pain x2-3 days. Describes it as a squeezing  And intermittent. She denies any other symptoms. Today she began having left neck pain.

## 2015-06-07 ENCOUNTER — Other Ambulatory Visit: Payer: Self-pay | Admitting: Obstetrics and Gynecology

## 2015-06-07 DIAGNOSIS — R928 Other abnormal and inconclusive findings on diagnostic imaging of breast: Secondary | ICD-10-CM

## 2015-06-12 ENCOUNTER — Ambulatory Visit
Admission: RE | Admit: 2015-06-12 | Discharge: 2015-06-12 | Disposition: A | Discharge status: Home / Self Care | Payer: BLUE CROSS/BLUE SHIELD | Source: Ambulatory Visit | Attending: Obstetrics and Gynecology | Admitting: Obstetrics and Gynecology

## 2015-06-12 DIAGNOSIS — R928 Other abnormal and inconclusive findings on diagnostic imaging of breast: Secondary | ICD-10-CM

## 2016-07-19 ENCOUNTER — Encounter (HOSPITAL_BASED_OUTPATIENT_CLINIC_OR_DEPARTMENT_OTHER): Payer: Self-pay | Admitting: Emergency Medicine

## 2016-07-19 ENCOUNTER — Emergency Department (HOSPITAL_BASED_OUTPATIENT_CLINIC_OR_DEPARTMENT_OTHER): Payer: BLUE CROSS/BLUE SHIELD

## 2016-07-19 ENCOUNTER — Emergency Department (HOSPITAL_BASED_OUTPATIENT_CLINIC_OR_DEPARTMENT_OTHER)
Admission: EM | Admit: 2016-07-19 | Discharge: 2016-07-19 | Disposition: A | Discharge status: Home / Self Care | Payer: BLUE CROSS/BLUE SHIELD | Attending: Emergency Medicine | Admitting: Emergency Medicine

## 2016-07-19 DIAGNOSIS — S62632B Displaced fracture of distal phalanx of right middle finger, initial encounter for open fracture: Secondary | ICD-10-CM | POA: Diagnosis not present

## 2016-07-19 DIAGNOSIS — S6991XA Unspecified injury of right wrist, hand and finger(s), initial encounter: Secondary | ICD-10-CM

## 2016-07-19 DIAGNOSIS — Y999 Unspecified external cause status: Secondary | ICD-10-CM | POA: Insufficient documentation

## 2016-07-19 DIAGNOSIS — W231XXA Caught, crushed, jammed, or pinched between stationary objects, initial encounter: Secondary | ICD-10-CM | POA: Insufficient documentation

## 2016-07-19 DIAGNOSIS — Z79899 Other long term (current) drug therapy: Secondary | ICD-10-CM | POA: Insufficient documentation

## 2016-07-19 DIAGNOSIS — Z23 Encounter for immunization: Secondary | ICD-10-CM | POA: Diagnosis not present

## 2016-07-19 DIAGNOSIS — Y939 Activity, unspecified: Secondary | ICD-10-CM | POA: Diagnosis not present

## 2016-07-19 DIAGNOSIS — S62639B Displaced fracture of distal phalanx of unspecified finger, initial encounter for open fracture: Secondary | ICD-10-CM

## 2016-07-19 DIAGNOSIS — S61319A Laceration without foreign body of unspecified finger with damage to nail, initial encounter: Secondary | ICD-10-CM

## 2016-07-19 DIAGNOSIS — Y929 Unspecified place or not applicable: Secondary | ICD-10-CM | POA: Insufficient documentation

## 2016-07-19 DIAGNOSIS — F17200 Nicotine dependence, unspecified, uncomplicated: Secondary | ICD-10-CM | POA: Insufficient documentation

## 2016-07-19 MED ORDER — CIPROFLOXACIN HCL 750 MG PO TABS: 750.0000 mg | ORAL_TABLET | Freq: Two times a day (BID) | ORAL | 0 refills | Status: DC

## 2016-07-19 MED ORDER — TETANUS-DIPHTH-ACELL PERTUSSIS 5-2.5-18.5 LF-MCG/0.5 IM SUSP
0.5000 mL | Freq: Once | INTRAMUSCULAR | Status: AC
  Administered 2016-07-19: 0.5 mL via INTRAMUSCULAR
  Filled 2016-07-19: qty 0.5

## 2016-07-19 NOTE — ED Provider Notes (Signed)
[facility] Provider Note   CSN: [id number] Arrival date & time: 07/19/16  1423     History   Chief Complaint Chief Complaint  Patient presents with  . Laceration    HPI [name] is a 45 y.o. female.  HPI [name] is a 45 y.o. female with PMH significant for bipolar disorder who presents with sudden onset, constant, mildly painful right middle finger laceration sustained approximately 5 PM yesterday when her finger got slammed in a door. She reports minimal pain at this time. She denies numbness or weakness. She endorses full range of motion of her fingers. Her tetanus is not up-to-date.  Past Medical History:  Diagnosis Date  . Bipolar disorder (HCC)     There are no active problems to display for this patient.   Past Surgical History:  Procedure Laterality Date  . EYE SURGERY    . GALLBLADDER SURGERY  2003    OB History    No data available       Home Medications    Prior to Admission medications   Medication Sig Start Date End Date Taking? Authorizing Provider  Cariprazine HCl (VRAYLAR PO) Take by mouth.   Yes Historical Provider, MD  Methylphenidate HCl (RITALIN PO) Take by mouth.   Yes Historical Provider, MD  OXCARBAZEPINE PO Take by mouth.   Yes Historical Provider, MD  ARIPiprazole (ABILIFY) 20 MG tablet Take 20 mg by mouth daily.    Historical Provider, MD  ciprofloxacin (CIPRO) 750 MG tablet Take 1 tablet (750 mg total) by mouth 2 (two) times daily. 07/19/16   Cheri FowlerKayla Alia Parsley, PA-C  divalproex (DEPAKOTE) 500 MG DR tablet Take 1,000 mg by mouth.    Historical Provider, MD  DULoxetine HCl (CYMBALTA PO) Take by mouth.    Historical Provider, MD  HYDROcodone-acetaminophen (NORCO/VICODIN) 5-325 MG per tablet Take 1 tablet by mouth every 6 (six) hours as needed for moderate pain. 06/29/13   Sherren MochaEva N Shaw, MD  ibuprofen (ADVIL,MOTRIN) 800 MG tablet Take 1 tablet (800 mg total) by mouth 3 (three) times daily. 07/12/13   Elwin MochaBlair Walden, MD    LORAZEPAM PO Take by mouth.    Historical Provider, MD  methylPREDNISolone (MEDROL, PAK,) 4 MG tablet follow package directions 06/29/13   Sherren MochaEva N Shaw, MD  Multiple Vitamin (MULTI-VITAMIN DAILY PO) Take by mouth.    Historical Provider, MD  ziprasidone (GEODON) 60 MG capsule Take 60 mg by mouth 2 (two) times daily with a meal.    Historical Provider, MD    Family History History reviewed. No pertinent family history.  Social History Social History  Substance Use Topics  . Smoking status: Current Every Day Smoker  . Smokeless tobacco: Never Used  . Alcohol use Yes     Comment: occ     Allergies   Ambien [zolpidem tartrate] and Wellbutrin [bupropion]   Review of Systems Review of Systems  All other systems negative unless otherwise stated in HPI    Physical Exam Updated Vital Signs BP 97/59 (BP Location: Left Arm)   Pulse 77   Temp 98.1 F (36.7 C) (Oral)   Resp 19   Ht 5\' 5"  (1.651 m)   Wt 79.4 kg   LMP 07/05/2016   SpO2 98%   BMI 29.12 kg/m   Physical Exam  Constitutional: She is oriented to person, place, and time. She appears well-developed and well-nourished.  HENT:  Head: Normocephalic and atraumatic.  Right Ear: External ear normal.  Left Ear: External ear normal.  Eyes: Conjunctivae are normal. No scleral icterus.  Neck: No tracheal deviation present.  Pulmonary/Chest: Effort normal. No respiratory distress.  Abdominal: She exhibits no distension.  Musculoskeletal: Normal range of motion. She exhibits no tenderness.  Full active range of motion of right third digit in flexion and extension at the MCP, PIPJ, and DIPJ.   Neurological: She is alert and oriented to person, place, and time.  Strength and sensation intact.   Skin: Skin is warm and dry.  Third right finger with distal nail bed laceration.  Bleeding controlled with pressure.   Psychiatric: She has a normal mood and affect. Her behavior is normal.     ED Treatments / Results  Labs (all  labs ordered are listed, but only abnormal results are displayed) Labs Reviewed - No data to display  EKG  EKG Interpretation None       Radiology Dg Finger Middle Right  Result Date: 07/19/2016 CLINICAL DATA:  Pain after trauma EXAM: RIGHT MIDDLE FINGER 2+V COMPARISON:  None. FINDINGS: There is a laceration in the distal third finger. There is a fracture through the distal tuft. No foreign body. No other abnormalities. IMPRESSION: Laceration through the distal third finger soft tissues with no foreign body. There is an underlying distal tuft fracture. Electronically Signed   By: Gerome Samavid  Williams III M.D   On: 07/19/2016 16:09    Procedures Procedures (including critical care time)     Medications Ordered in ED Medications  Tdap (BOOSTRIX) injection 0.5 mL (0.5 mLs Intramuscular Given 07/19/16 1527)     Initial Impression / Assessment and Plan / ED Course  I have reviewed the triage vital signs and the nursing notes.  Pertinent labs & imaging results that were available during my care of the patient were reviewed by me and considered in my medical decision making (see chart for details).  Clinical Course    Patient presents with right middle finger tuft fx and nail bed laceration 24 hours ago.  Tetanus up dated in ED.  Spoke with Dr. Amanda PeaGramig, hand surgery, wound irrigated and dressed.  Will discharge home with Cipro and hand follow up in the next 1-2 days.  Return precaution discussed.  Stable for discharge.     Final Clinical Impressions(s) / ED Diagnoses   Final diagnoses:  Injury of right middle finger, initial encounter  Laceration of nail bed of finger, initial encounter  Open fracture of tuft of distal phalanx of finger    New Prescriptions New Prescriptions   CIPROFLOXACIN (CIPRO) 750 MG TABLET    Take 1 tablet (750 mg total) by mouth 2 (two) times daily.     Cheri FowlerKayla Mackensi Mahadeo, PA-C 07/19/16 1715    Tilden FossaElizabeth Rees, MD 07/20/16 1355

## 2016-07-19 NOTE — ED Notes (Signed)
Patient reports closing her finger in a door at home last night at @5pm .   PA at bedside.

## 2016-07-19 NOTE — ED Triage Notes (Signed)
Pt c/o laceration to RT middle finger last pm about 5pm; finger was shut in door

## 2016-07-19 NOTE — Discharge Instructions (Signed)
Start taking Cipro twice daily to prevent infection. Please call Dr. Carlos LeveringGramig's office on Monday to be seen for your injury.  Take Ibuprofen, Aleve, or Tylenol for pain or discomfort.  Return to the ED for severe pain, fever, increased redness, purulent drainage, or any new or concerning symptoms.

## 2016-07-29 ENCOUNTER — Emergency Department (HOSPITAL_BASED_OUTPATIENT_CLINIC_OR_DEPARTMENT_OTHER): Payer: BLUE CROSS/BLUE SHIELD

## 2016-07-29 ENCOUNTER — Inpatient Hospital Stay (HOSPITAL_BASED_OUTPATIENT_CLINIC_OR_DEPARTMENT_OTHER)
Admission: EM | Admit: 2016-07-29 | Discharge: 2016-07-31 | DRG: 312 | Disposition: A | Discharge status: Home / Self Care | Payer: BLUE CROSS/BLUE SHIELD | Attending: Internal Medicine | Admitting: Internal Medicine

## 2016-07-29 ENCOUNTER — Encounter (HOSPITAL_BASED_OUTPATIENT_CLINIC_OR_DEPARTMENT_OTHER): Payer: Self-pay | Admitting: Emergency Medicine

## 2016-07-29 DIAGNOSIS — W19XXXD Unspecified fall, subsequent encounter: Secondary | ICD-10-CM | POA: Diagnosis not present

## 2016-07-29 DIAGNOSIS — R001 Bradycardia, unspecified: Secondary | ICD-10-CM | POA: Diagnosis present

## 2016-07-29 DIAGNOSIS — I959 Hypotension, unspecified: Secondary | ICD-10-CM | POA: Diagnosis not present

## 2016-07-29 DIAGNOSIS — I9589 Other hypotension: Secondary | ICD-10-CM | POA: Diagnosis not present

## 2016-07-29 DIAGNOSIS — R531 Weakness: Secondary | ICD-10-CM | POA: Diagnosis not present

## 2016-07-29 DIAGNOSIS — I951 Orthostatic hypotension: Secondary | ICD-10-CM | POA: Diagnosis not present

## 2016-07-29 DIAGNOSIS — Z79899 Other long term (current) drug therapy: Secondary | ICD-10-CM | POA: Diagnosis not present

## 2016-07-29 DIAGNOSIS — R296 Repeated falls: Secondary | ICD-10-CM | POA: Diagnosis present

## 2016-07-29 DIAGNOSIS — T368X5A Adverse effect of other systemic antibiotics, initial encounter: Secondary | ICD-10-CM | POA: Diagnosis present

## 2016-07-29 DIAGNOSIS — F3130 Bipolar disorder, current episode depressed, mild or moderate severity, unspecified: Secondary | ICD-10-CM | POA: Diagnosis not present

## 2016-07-29 DIAGNOSIS — F17200 Nicotine dependence, unspecified, uncomplicated: Secondary | ICD-10-CM | POA: Diagnosis present

## 2016-07-29 DIAGNOSIS — F908 Attention-deficit hyperactivity disorder, other type: Secondary | ICD-10-CM | POA: Diagnosis not present

## 2016-07-29 DIAGNOSIS — F319 Bipolar disorder, unspecified: Secondary | ICD-10-CM | POA: Diagnosis present

## 2016-07-29 DIAGNOSIS — F17210 Nicotine dependence, cigarettes, uncomplicated: Secondary | ICD-10-CM | POA: Diagnosis not present

## 2016-07-29 DIAGNOSIS — T43595A Adverse effect of other antipsychotics and neuroleptics, initial encounter: Secondary | ICD-10-CM | POA: Diagnosis present

## 2016-07-29 DIAGNOSIS — W19XXXA Unspecified fall, initial encounter: Secondary | ICD-10-CM

## 2016-07-29 HISTORY — DX: Personal history of urinary calculi: Z87.442

## 2016-07-29 HISTORY — DX: Major depressive disorder, single episode, unspecified: F32.9

## 2016-07-29 HISTORY — DX: Depression, unspecified: F32.A

## 2016-07-29 HISTORY — DX: Unspecified convulsions: R56.9

## 2016-07-29 HISTORY — DX: Bradycardia, unspecified: R00.1

## 2016-07-29 HISTORY — DX: Anxiety disorder, unspecified: F41.9

## 2016-07-29 LAB — COMPREHENSIVE METABOLIC PANEL
ALT: 9 U/L — ABNORMAL LOW (ref 14–54)
ANION GAP: 8 (ref 5–15)
AST: 13 U/L — AB (ref 15–41)
Albumin: 3.8 g/dL (ref 3.5–5.0)
Alkaline Phosphatase: 97 U/L (ref 38–126)
BUN: 9 mg/dL (ref 6–20)
CHLORIDE: 105 mmol/L (ref 101–111)
CO2: 22 mmol/L (ref 22–32)
Calcium: 9.4 mg/dL (ref 8.9–10.3)
Creatinine, Ser: 0.86 mg/dL (ref 0.44–1.00)
Glucose, Bld: 159 mg/dL — ABNORMAL HIGH (ref 65–99)
POTASSIUM: 4 mmol/L (ref 3.5–5.1)
Sodium: 135 mmol/L (ref 135–145)
Total Bilirubin: 0.5 mg/dL (ref 0.3–1.2)
Total Protein: 7.1 g/dL (ref 6.5–8.1)

## 2016-07-29 LAB — URINALYSIS, MICROSCOPIC (REFLEX)

## 2016-07-29 LAB — URINALYSIS, ROUTINE W REFLEX MICROSCOPIC
Bilirubin Urine: NEGATIVE
GLUCOSE, UA: NEGATIVE mg/dL
Ketones, ur: NEGATIVE mg/dL
Leukocytes, UA: NEGATIVE
Nitrite: NEGATIVE
PH: 6 (ref 5.0–8.0)
PROTEIN: NEGATIVE mg/dL
Specific Gravity, Urine: 1.01 (ref 1.005–1.030)

## 2016-07-29 LAB — D-DIMER, QUANTITATIVE (NOT AT ARMC)

## 2016-07-29 LAB — CBC WITH DIFFERENTIAL/PLATELET
BASOS ABS: 0 10*3/uL (ref 0.0–0.1)
Basophils Relative: 0 %
EOS PCT: 3 %
Eosinophils Absolute: 0.3 10*3/uL (ref 0.0–0.7)
HCT: 41.1 % (ref 36.0–46.0)
Hemoglobin: 13.8 g/dL (ref 12.0–15.0)
LYMPHS PCT: 31 %
Lymphs Abs: 2.9 10*3/uL (ref 0.7–4.0)
MCH: 29.7 pg (ref 26.0–34.0)
MCHC: 33.6 g/dL (ref 30.0–36.0)
MCV: 88.4 fL (ref 78.0–100.0)
MONO ABS: 0.8 10*3/uL (ref 0.1–1.0)
Monocytes Relative: 8 %
Neutro Abs: 5.3 10*3/uL (ref 1.7–7.7)
Neutrophils Relative %: 58 %
PLATELETS: 320 10*3/uL (ref 150–400)
RBC: 4.65 MIL/uL (ref 3.87–5.11)
RDW: 13.3 % (ref 11.5–15.5)
WBC: 9.3 10*3/uL (ref 4.0–10.5)

## 2016-07-29 LAB — TROPONIN I

## 2016-07-29 LAB — I-STAT CG4 LACTIC ACID, ED: LACTIC ACID, VENOUS: 0.91 mmol/L (ref 0.5–1.9)

## 2016-07-29 LAB — HCG, SERUM, QUALITATIVE: PREG SERUM: NEGATIVE

## 2016-07-29 LAB — PROTIME-INR
INR: 1
Prothrombin Time: 13.2 seconds (ref 11.4–15.2)

## 2016-07-29 MED ORDER — SODIUM CHLORIDE 0.9 % IV BOLUS (SEPSIS)
1000.0000 mL | Freq: Once | INTRAVENOUS | Status: AC
  Administered 2016-07-29: 1000 mL via INTRAVENOUS

## 2016-07-29 MED ORDER — ENOXAPARIN SODIUM 40 MG/0.4ML ~~LOC~~ SOLN
40.0000 mg | SUBCUTANEOUS | Status: DC
  Administered 2016-07-29 – 2016-07-30 (×2): 40 mg via SUBCUTANEOUS
  Filled 2016-07-29 (×2): qty 0.4

## 2016-07-29 MED ORDER — SODIUM CHLORIDE 0.9 % IV SOLN
INTRAVENOUS | Status: DC
  Administered 2016-07-29 – 2016-07-30 (×3): via INTRAVENOUS

## 2016-07-29 MED ORDER — SODIUM CHLORIDE 0.9% FLUSH
3.0000 mL | Freq: Two times a day (BID) | INTRAVENOUS | Status: DC
  Administered 2016-07-29 – 2016-07-31 (×3): 3 mL via INTRAVENOUS

## 2016-07-29 NOTE — Progress Notes (Signed)
[facility]. MD and Cardiologist notified.

## 2016-07-29 NOTE — ED Notes (Signed)
MD at bedside. 

## 2016-07-29 NOTE — Consult Note (Signed)
Reason for Consult:   Bradycardia, syncope  Requesting Physician: Family practice Primary Cardiologist New  HPI:   45 y/o female with a history of Bipolar disorder on several OP medications, admitted to [facility] today with a history of multiple falls, lightheadedness, malaise, fatigue, hypotension, and low heart rate. Patient is brought in by EMS with a blood pressure in the 60s and a heart rate in the upper 30s/low 40s. Patient reports that for the last few days, she has been feeling weak and tired and over the last 2 days has had 3 falls. Patient denies loss of consciousness but reports being near syncopal. EKG shows sinus bradycardia with short PR. She has been treated recently for an infected finger with Cipro, her last dose was yesterday.   PMHx:  Past Medical History:  Diagnosis Date  . Bipolar disorder [facility](HCC)     Past Surgical History:  Procedure Laterality Date  . EYE SURGERY    . GALLBLADDER SURGERY  2003    SOCHx:  reports that she has been smoking.  She has never used smokeless tobacco. She reports that she drinks alcohol. She reports that she does not use drugs.  FAMHx: History reviewed. No pertinent family history.  ALLERGIES: Allergies  Allergen Reactions  . Ambien [Zolpidem Tartrate]     Seizure   . Wellbutrin [Bupropion]     Seizure    ROS: Review of Systems: General: negative for chills, fever, night sweats or weight changes.  Cardiovascular: negative for chest pain, dyspnea on exertion, edema, orthopnea, palpitations, paroxysmal nocturnal dyspnea or shortness of breath HEENT: negative for any visual disturbances, blindness, glaucoma Dermatological: negative for rash Respiratory: negative for cough, hemoptysis, or wheezing Urologic: negative for hematuria or dysuria Abdominal: negative for nausea, vomiting, diarrhea, bright red blood per rectum, melena, or hematemesis Neurologic: negative for visual changes, syncope, or  dizziness Musculoskeletal: negative for back pain, joint pain, or swelling Psych: cooperative and appropriate All other systems reviewed and are otherwise negative except as noted above.   HOME MEDICATIONS: Prior to Admission medications   Medication Sig Start Date End Date Taking? Authorizing Provider  ARIPiprazole (ABILIFY) 20 MG tablet Take 20 mg by mouth daily.   Yes Historical Provider, MD  Cariprazine HCl 3 MG CAPS Take 1 capsule by mouth daily.   Yes Historical Provider, MD  ciprofloxacin (CIPRO) 750 MG tablet Take 1 tablet (750 mg total) by mouth 2 (two) times daily. 07/19/16  Yes Cheri FowlerKayla Rose, PA-C  DULoxetine (CYMBALTA) 60 MG capsule Take 60 mg by mouth 2 (two) times daily.   Yes Historical Provider, MD  HYDROcodone-acetaminophen (NORCO/VICODIN) 5-325 MG per tablet Take 1 tablet by mouth every 6 (six) hours as needed for moderate pain. 06/29/13  Yes Sherren MochaEva N Shaw, MD  ibuprofen (ADVIL,MOTRIN) 800 MG tablet Take 1 tablet (800 mg total) by mouth 3 (three) times daily. 07/12/13  Yes Elwin MochaBlair Walden, MD  LORazepam (ATIVAN) 1 MG tablet Take 1 mg by mouth every 6 (six) hours as needed for anxiety.   Yes Historical Provider, MD  methylphenidate (RITALIN) 20 MG tablet Take 20 mg by mouth daily.   Yes Historical Provider, MD  methylPREDNISolone (MEDROL, PAK,) 4 MG tablet follow package directions 06/29/13  Yes Sherren MochaEva N Shaw, MD  Multiple Vitamin (MULTI-VITAMIN DAILY PO) Take by mouth.   Yes Historical Provider, MD  OXcarbazepine (TRILEPTAL) 150 MG tablet Take 150 mg by mouth 2 (two) times daily.   Yes Historical Provider,  MD  ziprasidone (GEODON) 60 MG capsule Take 60 mg by mouth 2 (two) times daily with a meal.   Yes Historical Provider, MD  Cariprazine HCl (VRAYLAR PO) Take by mouth. 3mg     Historical Provider, MD  divalproex (DEPAKOTE) 500 MG DR tablet Take 1,000 mg by mouth.    Historical Provider, MD  DULoxetine HCl (CYMBALTA PO) Take by mouth. 60mg  bid    Historical Provider, MD  LORAZEPAM PO Take  by mouth. 1mg     Historical Provider, MD  Methylphenidate HCl (RITALIN PO) Take by mouth. 20mg     Historical Provider, MD  OXCARBAZEPINE PO Take by mouth. 150mg  bid qhs    Historical Provider, MD    HOSPITAL MEDICATIONS: I have reviewed the patient's current medications.  VITALS: Blood pressure (!) 88/47, pulse (!) 50, temperature 97.8 F (36.6 C), temperature source Oral, resp. rate 14, height 5\' 5"  (1.651 m), weight 179 lb 1.6 oz (81.2 kg), last menstrual period 07/05/2016, SpO2 99 %.  PHYSICAL EXAM: General appearance: alert, cooperative and no distress Neck: no carotid bruit and no JVD Lungs: clear to auscultation bilaterally Heart: regular rate and rhythm Abdomen: soft, non-tender; bowel sounds normal; no masses,  no organomegaly Extremities: extremities normal, atraumatic, no cyanosis or edema Pulses: 2+ and symmetric Skin: Skin color, texture, turgor normal. No rashes or lesions Neurologic: Grossly normal  LABS: Results for orders placed or performed during the hospital encounter of 07/29/16 (from the past 24 hour(s))  CBC with Differential     Status: None   Collection Time: 07/29/16  2:15 PM  Result Value Ref Range   WBC 9.3 4.0 - 10.5 K/uL   RBC 4.65 3.87 - 5.11 MIL/uL   Hemoglobin 13.8 12.0 - 15.0 g/dL   HCT 19.1 47.8 - 29.5 %   MCV 88.4 78.0 - 100.0 fL   MCH 29.7 26.0 - 34.0 pg   MCHC 33.6 30.0 - 36.0 g/dL   RDW 62.1 30.8 - 65.7 %   Platelets 320 150 - 400 K/uL   Neutrophils Relative % 58 %   Neutro Abs 5.3 1.7 - 7.7 K/uL   Lymphocytes Relative 31 %   Lymphs Abs 2.9 0.7 - 4.0 K/uL   Monocytes Relative 8 %   Monocytes Absolute 0.8 0.1 - 1.0 K/uL   Eosinophils Relative 3 %   Eosinophils Absolute 0.3 0.0 - 0.7 K/uL   Basophils Relative 0 %   Basophils Absolute 0.0 0.0 - 0.1 K/uL  Comprehensive metabolic panel     Status: Abnormal   Collection Time: 07/29/16  2:15 PM  Result Value Ref Range   Sodium 135 135 - 145 mmol/L   Potassium 4.0 3.5 - 5.1 mmol/L    Chloride 105 101 - 111 mmol/L   CO2 22 22 - 32 mmol/L   Glucose, Bld 159 (H) 65 - 99 mg/dL   BUN 9 6 - 20 mg/dL   Creatinine, Ser 8.46 0.44 - 1.00 mg/dL   Calcium 9.4 8.9 - 96.2 mg/dL   Total Protein 7.1 6.5 - 8.1 g/dL   Albumin 3.8 3.5 - 5.0 g/dL   AST 13 (L) 15 - 41 U/L   ALT 9 (L) 14 - 54 U/L   Alkaline Phosphatase 97 38 - 126 U/L   Total Bilirubin 0.5 0.3 - 1.2 mg/dL   GFR calc non Af Amer >60 >60 mL/min   GFR calc Af Amer >60 >60 mL/min   Anion gap 8 5 - 15  Troponin I  Status: None   Collection Time: 07/29/16  2:15 PM  Result Value Ref Range   Troponin I <0.03 <0.03 ng/mL  D-dimer, quantitative (not at [facility])     Status: None   Collection Time: 07/29/16  2:15 PM  Result Value Ref Range   D-Dimer, Quant <0.27 0.00 - 0.50 ug/mL-FEU  Protime-INR     Status: None   Collection Time: 07/29/16  2:15 PM  Result Value Ref Range   Prothrombin Time 13.2 11.4 - 15.2 seconds   INR 1.00   I-Stat CG4 Lactic Acid, ED     Status: None   Collection Time: 07/29/16  2:32 PM  Result Value Ref Range   Lactic Acid, Venous 0.91 0.5 - 1.9 mmol/L  hCG, serum, qualitative     Status: None   Collection Time: 07/29/16  4:08 PM  Result Value Ref Range   Preg, Serum NEGATIVE NEGATIVE    EKG: NSR-50 short PR  IMAGING: Dg Chest 1 View  Result Date: 07/29/2016 CLINICAL DATA:  Status post fall last night and twice today possibly row due to medication reaction. Patient was found read bradycardic and hypotensive on arrival to the emergency department. No report of chest pain or difficulty breathing. EXAM: CHEST 1 VIEW COMPARISON:  Chest x-ray of October 23, 2014 and CT scan of the chest of November 05, 2015. FINDINGS: The lungs are well-expanded and clear. There is no pneumothorax, pneumomediastinum, or pleural effusion. The heart and mediastinal structures are normal. There is no pulmonary vascular congestion. The bony thorax exhibits no acute abnormality. IMPRESSION: There is no active cardiopulmonary  disease. Electronically Signed   By: David  Swazil[place]Jordan M.D.   On: 07/29/2016 15:08   Ct Head Wo Contrast  Result Date: 07/29/2016 CLINICAL DATA:  Syncope.  Fell and hit head. EXAM: CT HEAD WITHOUT CONTRAST TECHNIQUE: Contiguous axial images were obtained from the base of the skull through the vertex without intravenous contrast. COMPARISON:  CT head 10/02/2007 FINDINGS: Brain: No evidence of acute infarction, hemorrhage, hydrocephalus, extra-axial collection or mass lesion/mass effect. Vascular: No hyperdense vessel or unexpected calcification. Skull: Negative Sinuses/Orbits: Negative Other: None IMPRESSION: Negative CT of the head. Electronically Signed   By: Marlan Palauharles  Clark M.D.   On: 07/29/2016 15:08    IMPRESSION: Principal Problem:   Symptomatic bradycardia Active Problems:   Hypotension   Bipolar disorder (HCC)   Smoker   RECOMMENDATION: Will discuss medications with MD, suspect this was an issue with Cipro and her Bipolar medications. May be best to hold her psych medications and observe on telemetry.    Time Spent Directly with Patient: 30 minutes  Corine ShelterLuke Kilroy, [place]  [phone] beeper 07/29/2016, 7:48 PM   I have seen and examined the patient along with Corine ShelterLuke Kilroy, PA.  I have reviewed the chart, notes and new data.  I agree with PA's note.  Key new complaints: feels better, just very tired. Hungry. Key examination changes: normal CV exam. BP improved, although still mildly hypotensive. Heart rate faster, mid 50s - still transitioning from mild sinus bradycardia to junctional escape rhythm Key new findings / data: ECG normal except for bradyarrhythmia (starts sinus brady, gradually junctional rhythm takes over), labs and CXR normal.  PLAN: No evidence for structural cardiac abnormalities.  Suspect it is most likely that her complex pharmacology is the cause for hemodynamic changes (with added interaction of drug metabolism suppression by Cipro). Would stop potential offending  meds and just monitor on overnight telemetry. Encourage fluid intake.  Thurmon FairMihai Jermisha Hoffart, MD,  [facility] [phone] 07/29/2016, 8:11 PM

## 2016-07-29 NOTE — Progress Notes (Signed)
   Patient coming [facility] for treatment of symptomatic bradycardia of unknown etiology. Cardiology team has been counseled and will see the patient when they [facility]. Patient accepted to telemetry bed under observation status.  Shelly Flattenavid Merrell, MD [facility] 07/29/2016, 5:33 PM

## 2016-07-29 NOTE — ED Notes (Signed)
Called [facility] to inform of bed placement @ [place] in 3 east room 6. Spoke with Maisie Fushomas.

## 2016-07-29 NOTE — ED Provider Notes (Signed)
[facility] Provider Note   CSN: [id number] Arrival date & time: 07/29/16  1348     History   Chief Complaint Chief Complaint  Patient presents with  . Fall    HPI [name] is a 45 y.o. female with a past medical history significant for bipolar disorder who presents with multiple falls, lightheadedness, malaise, fatigue, hypotension, and low heart rate. Patient is brought in by EMS with a blood pressure in the 60s and a heart rate in the upper 30s/low 40s. Patient reports that for the last few days, she has been feeling weak and tired and over the last 2 days has had 3 falls. Patient denies loss of consciousness but reports being near syncopal. She reports hitting her head during one fall in the kitchen this morning. She did not lose consciousness. She has a small abrasion to her forehead. Patient denies headaches, vision changes, nausea, vomiting, chest pain, palpitations, shortness of breath, outpatient, diarrhea, or dysuria. Patient denies fevers or chills. Patient reports that she had a finger laceration 10 days ago and had it repaired. Patient was put on 1 week of ciprofloxacin which she completed. Patient says that since taking the ciprofloxacin she has felt fatigued. She denies any sick contacts or any other traumatic injuries.    The history is provided by the patient, medical records and a relative. No language interpreter was used.  Dizziness  Quality:  Lightheadedness Severity:  Severe Onset quality:  Gradual Duration:  4 days Timing:  Constant Progression:  Worsening Chronicity:  New Context: not with loss of consciousness   Relieved by:  Nothing Worsened by:  Nothing Ineffective treatments:  None tried Associated symptoms: syncope (near syncope)   Associated symptoms: no chest pain, no diarrhea, no headaches, no nausea, no palpitations, no shortness of breath, no vomiting and no weakness (generalized)   Risk factors: multiple medications and new  medications     Past Medical History:  Diagnosis Date  . Bipolar disorder (HCC)     There are no active problems to display for this patient.   Past Surgical History:  Procedure Laterality Date  . EYE SURGERY    . GALLBLADDER SURGERY  2003    OB History    No data available       Home Medications    Prior to Admission medications   Medication Sig Start Date End Date Taking? Authorizing Provider  ARIPiprazole (ABILIFY) 20 MG tablet Take 20 mg by mouth daily.   Yes Historical Provider, MD  Cariprazine HCl (VRAYLAR PO) Take by mouth.   Yes Historical Provider, MD  ciprofloxacin (CIPRO) 750 MG tablet Take 1 tablet (750 mg total) by mouth 2 (two) times daily. 07/19/16  Yes Cheri FowlerKayla Rose, PA-C  DULoxetine HCl (CYMBALTA PO) Take by mouth.   Yes Historical Provider, MD  HYDROcodone-acetaminophen (NORCO/VICODIN) 5-325 MG per tablet Take 1 tablet by mouth every 6 (six) hours as needed for moderate pain. 06/29/13  Yes Sherren MochaEva N Shaw, MD  ibuprofen (ADVIL,MOTRIN) 800 MG tablet Take 1 tablet (800 mg total) by mouth 3 (three) times daily. 07/12/13  Yes Elwin MochaBlair Walden, MD  LORAZEPAM PO Take by mouth.   Yes Historical Provider, MD  Methylphenidate HCl (RITALIN PO) Take by mouth.   Yes Historical Provider, MD  methylPREDNISolone (MEDROL, PAK,) 4 MG tablet follow package directions 06/29/13  Yes Sherren MochaEva N Shaw, MD  Multiple Vitamin (MULTI-VITAMIN DAILY PO) Take by mouth.   Yes Historical Provider, MD  OXCARBAZEPINE PO Take by mouth.  Yes Historical Provider, MD  ziprasidone (GEODON) 60 MG capsule Take 60 mg by mouth 2 (two) times daily with a meal.   Yes Historical Provider, MD  divalproex (DEPAKOTE) 500 MG DR tablet Take 1,000 mg by mouth.    Historical Provider, MD    Family History History reviewed. No pertinent family history.  Social History Social History  Substance Use Topics  . Smoking status: Current Every Day Smoker  . Smokeless tobacco: Never Used  . Alcohol use Yes     Comment: occ      Allergies   Ambien [zolpidem tartrate] and Wellbutrin [bupropion]   Review of Systems Review of Systems  Constitutional: Positive for fatigue. Negative for chills, diaphoresis and fever.  HENT: Negative for congestion and rhinorrhea.   Respiratory: Negative for cough, chest tightness, shortness of breath, wheezing and stridor.   Cardiovascular: Positive for syncope (near syncope). Negative for chest pain, palpitations and leg swelling.  Gastrointestinal: Negative for abdominal pain, constipation, diarrhea, nausea and vomiting.  Genitourinary: Negative for dysuria, frequency and hematuria.  Musculoskeletal: Negative for back pain, neck pain and neck stiffness.  Skin: Negative for rash and wound.  Neurological: Positive for light-headedness. Negative for dizziness, seizures, syncope, weakness (generalized), numbness and headaches.  Psychiatric/Behavioral: Negative for agitation.  All other systems reviewed and are negative.    Physical Exam Updated Vital Signs BP (!) 81/50   Pulse (!) 54   Temp 97.7 F (36.5 C) (Oral)   Resp 18   Ht 5\' 5"  (1.651 m)   Wt 171 lb (77.6 kg)   LMP 07/05/2016   SpO2 100%   BMI 28.46 kg/m   Physical Exam  Constitutional: She is oriented to person, place, and time. She appears well-developed and well-nourished. No distress.  HENT:  Head: Normocephalic and atraumatic.  Mouth/Throat: No oropharyngeal exudate.  Eyes: Conjunctivae and EOM are normal. Pupils are equal, round, and reactive to light.  Neck: Normal range of motion. Neck supple.  Cardiovascular: Regular rhythm, normal heart sounds and intact distal pulses.  Bradycardia present.   No murmur heard. Pulmonary/Chest: Effort normal and breath sounds normal. No stridor. No respiratory distress. She has no wheezes. She has no rhonchi. She has no rales. She exhibits no tenderness.  Abdominal: Soft. There is no tenderness.  Musculoskeletal: She exhibits no edema.  Neurological: She is alert  and oriented to person, place, and time. She displays no tremor. No cranial nerve deficit or sensory deficit. She exhibits normal muscle tone. Coordination normal.  Skin: Skin is warm and dry. Capillary refill takes less than 2 seconds. No rash noted.  Psychiatric: She has a normal mood and affect.  Nursing note and vitals reviewed.    ED Treatments / Results  Labs (all labs ordered are listed, but only abnormal results are displayed) Labs Reviewed  COMPREHENSIVE METABOLIC PANEL - Abnormal; Notable for the following:       Result Value   Glucose, Bld 159 (*)    AST 13 (*)    ALT 9 (*)    All other components within normal limits  CULTURE, BLOOD (ROUTINE X 2)  CULTURE, BLOOD (ROUTINE X 2)  URINE CULTURE  CBC WITH DIFFERENTIAL/PLATELET  TROPONIN I  D-DIMER, QUANTITATIVE (NOT AT [employer])  PROTIME-INR  HCG, SERUM, QUALITATIVE  TROPONIN I  URINALYSIS, ROUTINE W REFLEX MICROSCOPIC  I-STAT CG4 LACTIC ACID, ED    EKG  EKG Interpretation  Date/Time:  Tuesday July 29 2016 14:38:41 EST Ventricular Rate:  47 PR Interval:  QRS Duration: 92 QT Interval:  474 QTC Calculation: 420 R Axis:   70 Text Interpretation:  Sinus bradycardia Short PR interval Abnormal ECG Confirmed by Rush Landmark MD, CHRISTOPHER [phone]) on 07/29/2016 5:02:18 PM       Radiology Dg Chest 1 View  Result Date: 07/29/2016 CLINICAL DATA:  Status post fall last night and twice today possibly row due to medication reaction. Patient was found read bradycardic and hypotensive on arrival to the emergency department. No report of chest pain or difficulty breathing. EXAM: CHEST 1 VIEW COMPARISON:  Chest x-ray of October 23, 2014 and CT scan of the chest of November 05, 2015. FINDINGS: The lungs are well-expanded and clear. There is no pneumothorax, pneumomediastinum, or pleural effusion. The heart and mediastinal structures are normal. There is no pulmonary vascular congestion. The bony thorax exhibits no acute abnormality.  IMPRESSION: There is no active cardiopulmonary disease. Electronically Signed   By: David  [place] M.D.   On: 07/29/2016 15:08   Ct Head Wo Contrast  Result Date: 07/29/2016 CLINICAL DATA:  Syncope.  Fell and hit head. EXAM: CT HEAD WITHOUT CONTRAST TECHNIQUE: Contiguous axial images were obtained from the base of the skull through the vertex without intravenous contrast. COMPARISON:  CT head 10/02/2007 FINDINGS: Brain: No evidence of acute infarction, hemorrhage, hydrocephalus, extra-axial collection or mass lesion/mass effect. Vascular: No hyperdense vessel or unexpected calcification. Skull: Negative Sinuses/Orbits: Negative Other: None IMPRESSION: Negative CT of the head. Electronically Signed   By: Marlan Palau M.D.   On: 07/29/2016 15:08    Procedures Procedures (including critical care time)  CRITICAL CARE Performed by: Canary Brim Tegeler Total critical care time: 45 minutes Critical care time was exclusive of separately billable procedures and treating other patients. Critical care was necessary to treat or prevent imminent or life-threatening deterioration. Critical care was time spent personally by me on the following activities: development of treatment plan with patient and/or surrogate as well as nursing, discussions with consultants, evaluation of patient's response to treatment, examination of patient, obtaining history from patient or surrogate, ordering and performing treatments and interventions, ordering and review of laboratory studies, ordering and review of radiographic studies, pulse oximetry and re-evaluation of patient's condition.    Medications Ordered in ED Medications  sodium chloride 0.9 % bolus 1,000 mL (0 mLs Intravenous Stopped 07/29/16 1534)  sodium chloride 0.9 % bolus 1,000 mL (0 mLs Intravenous Stopped 07/29/16 1532)     Initial Impression / Assessment and Plan / ED Course  I have reviewed the triage vital signs and the nursing notes.  Pertinent  labs & imaging results that were available during my care of the patient were reviewed by me and considered in my medical decision making (see chart for details).  Clinical Course    [name] is a 45 y.o. female with a past medical history significant for bipolar disorder who presents with multiple falls, lightheadedness, malaise, fatigue, hypotension, and low heart rate. Patient is brought in by EMS with a blood pressure in the 60s and a heart rate in the upper 30s/low 40s.  History and exam are seen above.   Shortly after arrival, patient was started on 2 L of normal saline. Patient had some improvement in her blood pressure. Patient continued to have bradycardia. Initial EKG showed short PR with no evidence of acute ischemia. Sinus bradycardia.  Patient had nonfocal neurologic exam. Patient had no numbness, tingling, or weakness of any extremities. Patient has symmetric pulses in her upper extremities and lower extremity.  Patient had no chest tenderness, clear lungs, and abdomen was nontender. No back tenderness. Hand examination showed well-healing and nontender finger laceration. No evidence of spreading infection. She had small abrasion on forehead with from fall.  Patient had workup to look for etiology of symptom bradycardia. Preg test is negative, INR normal, nonelevated lactic acid, none elevated d-dimer, and negative troponin. CMP showed no significant electrolyte abnormalities. CBC showed no leukocytosis or anemia. The function and liver function nonelevated. CT scan showed no injuries to the head and chest x-ray showed no abnormality.  After grossly negative workup, patient remained bradycardic with a low blood pressure. Blood pressure improved into the 90 systolic after several liters of fluid.  Cardiology was called and they recommended patient be admitted for further management and observation. They suspect a combination of patient's home medicines caused the bradycardia. Review  of the patient's medications with family revealed patient is on Vraylar, Cymbalta, Ativan, Ritalin, oxcarbazepine, and is no longer on the Cipro, Depakote, or other medicines.  Internal medicine team was called and they agreed with admission. [facility].    Final Clinical Impressions(s) / ED Diagnoses   Final diagnoses:  Symptomatic bradycardia  Hypotension, unspecified hypotension type     Clinical Impression: 1. Symptomatic bradycardia   2. Fall   3. Hypotension, unspecified hypotension type     Disposition: Admit to Hospitalist service with cardiology consultation    Canary Brimhristopher J Tegeler, MD 07/29/16 1710

## 2016-07-29 NOTE — ED Triage Notes (Signed)
Patient reports starting Cipro on Saturday for a finger injury. Patient started feeling bad after taking the medication, with generalized body aches, and dizziness. Patient fell last night, and twice today. Striking her head. Patient c/o headache, dizziness. Patient reports her only medical hx to be bipolar for which she takes a variety of medications. Patient was bradycardic and hypotensive on arrival to ER. Patient accompanied by a friend. Patient denies any difficulties with breathing. Denies chest pain. Patient is A&Ox4.

## 2016-07-29 NOTE — Progress Notes (Signed)
Pt arrived to floor.  Oriented to room.  Call bell at reach.  Instructed to call for assistance as needed.  Verbalized understanding.  Also notified Kay, RN admission nurse of Joyce Grosspt's arrival to floor.  Will continue to monitor.  Amanda PeaNellie Jeovany Huitron, Charity fundraiserN.

## 2016-07-29 NOTE — H&P (Signed)
History and Physical    [name] [id number] DOB: August 25, 1970 DOA: 07/29/2016   PCP: Bosie ClosICE,KATHLEEN M, MD Chief Complaint:  Chief Complaint  Patient presents with  . Fall    HPI: Mary MisChristy Shields is a 45 y.o. female with medical history significant of BPD, on a number of psychiatric meds.  Recently put on cipro over this past week for a finger lac repair.  Took last dose yesterday.  [facility] with a history of generalized weakness, lightheadedness, multiple falls at home.  Symptoms persistent and worsening over past couple of days.  Nothing makes better or worse.  EMS BP 60s, HR upper 30s low 40s.    ED Course: BP improves to 100 systolic after 2L IVF and then trends back down to 80s.  HR now 52.  Review of Systems: As per HPI otherwise 10 point review of systems negative.    Past Medical History:  Diagnosis Date  . Bipolar disorder [facility](HCC)     Past Surgical History:  Procedure Laterality Date  . EYE SURGERY    . GALLBLADDER SURGERY  2003     reports that she has been smoking.  She has never used smokeless tobacco. She reports that she drinks alcohol. She reports that she does not use drugs.  Allergies  Allergen Reactions  . Ambien [Zolpidem Tartrate]     Seizure   . Wellbutrin [Bupropion]     Seizure    History reviewed. No pertinent family history. No history of pacemaker.   Prior to Admission medications   Medication Sig Start Date End Date Taking? Authorizing Provider  Cariprazine HCl 3 MG CAPS Take 1 capsule by mouth daily.   Yes Historical Provider, MD  DULoxetine (CYMBALTA) 60 MG capsule Take 60 mg by mouth 2 (two) times daily.   Yes Historical Provider, MD  ibuprofen (ADVIL,MOTRIN) 800 MG tablet Take 1 tablet (800 mg total) by mouth 3 (three) times daily. 07/12/13  Yes Elwin MochaBlair Walden, MD  LORazepam (ATIVAN) 1 MG tablet Take 1 mg by mouth every 6 (six) hours as needed for anxiety.   Yes Historical Provider, MD  methylphenidate (RITALIN) 20 MG  tablet Take 20 mg by mouth daily.   Yes Historical Provider, MD  Multiple Vitamin (MULTI-VITAMIN DAILY PO) Take by mouth.   Yes Historical Provider, MD  OXcarbazepine (TRILEPTAL) 150 MG tablet Take 150 mg by mouth 2 (two) times daily.   Yes Historical Provider, MD    Physical Exam: Vitals:   07/29/16 1600 07/29/16 1630 07/29/16 1700 07/29/16 1907  BP: 107/62 97/66 (!) 95/52 (!) 88/47  Pulse: (!) 46 (!) 46 (!) 58 (!) 50  Resp: 13 16 14 14   Temp:    97.8 F (36.6 C)  TempSrc:    Oral  SpO2: 99% 99% 100% 99%  Weight:    81.2 kg (179 lb 1.6 oz)  Height:    5\' 5"  (1.651 m)      Constitutional: NAD, calm, comfortable Eyes: PERRL, lids and conjunctivae normal ENMT: Mucous membranes are moist. Posterior pharynx clear of any exudate or lesions.Normal dentition.  Neck: normal, supple, no masses, no thyromegaly Respiratory: clear to auscultation bilaterally, no wheezing, no crackles. Normal respiratory effort. No accessory muscle use.  Cardiovascular: Regular rate and rhythm, no murmurs / rubs / gallops. No extremity edema. 2+ pedal pulses. No carotid bruits.  Abdomen: no tenderness, no masses palpated. No hepatosplenomegaly. Bowel sounds positive.  Musculoskeletal: no clubbing / cyanosis. No joint deformity upper and lower extremities. Good ROM,  no contractures. Normal muscle tone.  Skin: no rashes, lesions, ulcers. No induration Neurologic: CN 2-12 grossly intact. Sensation intact, DTR normal. Strength 5/5 in all 4.  Psychiatric: Normal judgment and insight. Alert and oriented x 3. Normal mood.    Labs on Admission: I have personally reviewed following labs and imaging studies  CBC:  Recent Labs Lab 07/29/16 1415  WBC 9.3  NEUTROABS 5.3  HGB 13.8  HCT 41.1  MCV 88.4  PLT 320   Basic Metabolic Panel:  Recent Labs Lab 07/29/16 1415  NA 135  K 4.0  CL 105  CO2 22  GLUCOSE 159*  BUN 9  CREATININE 0.86  CALCIUM 9.4   GFR: Estimated Creatinine Clearance: 87.9 mL/min  (by C-G formula based on SCr of 0.86 mg/dL). Liver Function Tests:  Recent Labs Lab 07/29/16 1415  AST 13*  ALT 9*  ALKPHOS 97  BILITOT 0.5  PROT 7.1  ALBUMIN 3.8   No results for input(s): LIPASE, AMYLASE in the last 168 hours. No results for input(s): AMMONIA in the last 168 hours. Coagulation Profile:  Recent Labs Lab 07/29/16 1415  INR 1.00   Cardiac Enzymes:  Recent Labs Lab 07/29/16 1415  TROPONINI <0.03   BNP (last 3 results) No results for input(s): PROBNP in the last 8760 hours. HbA1C: No results for input(s): HGBA1C in the last 72 hours. CBG: No results for input(s): GLUCAP in the last 168 hours. Lipid Profile: No results for input(s): CHOL, HDL, LDLCALC, TRIG, CHOLHDL, LDLDIRECT in the last 72 hours. Thyroid Function Tests: No results for input(s): TSH, T4TOTAL, FREET4, T3FREE, THYROIDAB in the last 72 hours. Anemia Panel: No results for input(s): VITAMINB12, FOLATE, FERRITIN, TIBC, IRON, RETICCTPCT in the last 72 hours. Urine analysis:    Component Value Date/Time   COLORURINE YELLOW 10/23/2014 1310   APPEARANCEUR CLEAR 10/23/2014 1310   LABSPEC 1.004 (L) 10/23/2014 1310   PHURINE 6.0 10/23/2014 1310   GLUCOSEU NEGATIVE 10/23/2014 1310   HGBUR TRACE (A) 10/23/2014 1310   BILIRUBINUR NEGATIVE 10/23/2014 1310   KETONESUR NEGATIVE 10/23/2014 1310   PROTEINUR NEGATIVE 10/23/2014 1310   UROBILINOGEN 0.2 10/23/2014 1310   NITRITE NEGATIVE 10/23/2014 1310   LEUKOCYTESUR NEGATIVE 10/23/2014 1310   Sepsis Labs: @LABRCNTIP (procalcitonin:4,lacticidven:4) )No results found for this or any previous visit (from the past 240 hour(s)).   Radiological Exams on Admission: Dg Chest 1 View  Result Date: 07/29/2016 CLINICAL DATA:  Status post fall last night and twice today possibly row due to medication reaction. Patient was found read bradycardic and hypotensive on arrival to the emergency department. No report of chest pain or difficulty breathing. EXAM:  CHEST 1 VIEW COMPARISON:  Chest x-ray of October 23, 2014 and CT scan of the chest of November 05, 2015. FINDINGS: The lungs are well-expanded and clear. There is no pneumothorax, pneumomediastinum, or pleural effusion. The heart and mediastinal structures are normal. There is no pulmonary vascular congestion. The bony thorax exhibits no acute abnormality. IMPRESSION: There is no active cardiopulmonary disease. Electronically Signed   By: David  [place] M.D.   On: 07/29/2016 15:08   Ct Head Wo Contrast  Result Date: 07/29/2016 CLINICAL DATA:  Syncope.  Fell and hit head. EXAM: CT HEAD WITHOUT CONTRAST TECHNIQUE: Contiguous axial images were obtained from the base of the skull through the vertex without intravenous contrast. COMPARISON:  CT head 10/02/2007 FINDINGS: Brain: No evidence of acute infarction, hemorrhage, hydrocephalus, extra-axial collection or mass lesion/mass effect. Vascular: No hyperdense vessel or unexpected calcification. Skull: Negative  Sinuses/Orbits: Negative Other: None IMPRESSION: Negative CT of the head. Electronically Signed   By: Marlan Palauharles  Clark M.D.   On: 07/29/2016 15:08    EKG: Independently reviewed.  Assessment/Plan Principal Problem:   Symptomatic bradycardia Active Problems:   Hypotension   Bipolar disorder (HCC)   Smoker    1. Symptomatic bradycardia - with hypotension 1. Suspect med interaction due to Cipro 2. Will hold all psych meds for tomorrow 3. Monitor on tele 4. IVF at 125 cc/hr 5. See Cards note. 2. BPD - 1. Holding psych meds due to symptomatic bradycardia 2. Suspect med levels may have been adversely impacted by recent cipro course.   DVT prophylaxis: Lovenox Code Status: Full Family Communication: Family at bedside Consults called: Cardiology Admission status: Admit to inpatient   Hillary BowGARDNER, Talea Manges M. DO [facility] Pager [phone] from 7PM-7AM  If 7AM-7PM, please contact the day physician for the patient [email or web address] Password  [email or web address]  07/29/2016, 8:24 PM

## 2016-07-29 NOTE — ED Notes (Signed)
ED Provider at bedside. 

## 2016-07-29 NOTE — ED Notes (Signed)
Patient transported to CT with this RN to monitor.

## 2016-07-30 DIAGNOSIS — F17210 Nicotine dependence, cigarettes, uncomplicated: Secondary | ICD-10-CM

## 2016-07-30 DIAGNOSIS — I959 Hypotension, unspecified: Secondary | ICD-10-CM

## 2016-07-30 DIAGNOSIS — Z9889 Other specified postprocedural states: Secondary | ICD-10-CM

## 2016-07-30 DIAGNOSIS — F908 Attention-deficit hyperactivity disorder, other type: Secondary | ICD-10-CM

## 2016-07-30 DIAGNOSIS — W19XXXA Unspecified fall, initial encounter: Secondary | ICD-10-CM

## 2016-07-30 DIAGNOSIS — Z888 Allergy status to other drugs, medicaments and biological substances status: Secondary | ICD-10-CM

## 2016-07-30 DIAGNOSIS — W19XXXD Unspecified fall, subsequent encounter: Secondary | ICD-10-CM

## 2016-07-30 DIAGNOSIS — Z79899 Other long term (current) drug therapy: Secondary | ICD-10-CM

## 2016-07-30 DIAGNOSIS — F319 Bipolar disorder, unspecified: Secondary | ICD-10-CM

## 2016-07-30 LAB — BASIC METABOLIC PANEL
Anion gap: 5 (ref 5–15)
BUN: 7 mg/dL (ref 6–20)
CHLORIDE: 113 mmol/L — AB (ref 101–111)
CO2: 19 mmol/L — AB (ref 22–32)
CREATININE: 0.77 mg/dL (ref 0.44–1.00)
Calcium: 8 mg/dL — ABNORMAL LOW (ref 8.9–10.3)
GFR calc Af Amer: >60 mL/min (ref >60)
GFR calc non Af Amer: >60 mL/min (ref >60)
GLUCOSE: 116 mg/dL — AB (ref 65–99)
POTASSIUM: 3.7 mmol/L (ref 3.5–5.1)
SODIUM: 137 mmol/L (ref 135–145)

## 2016-07-30 LAB — TSH: TSH: 0.6 u[IU]/mL (ref 0.350–4.500)

## 2016-07-30 MED ORDER — SODIUM CHLORIDE 0.9 % IV SOLN
INTRAVENOUS | Status: AC
  Administered 2016-07-30: 21:00:00 via INTRAVENOUS

## 2016-07-30 MED ORDER — METHYLPHENIDATE HCL 5 MG PO TABS
10.0000 mg | ORAL_TABLET | Freq: Two times a day (BID) | ORAL | Status: DC
  Administered 2016-07-30 – 2016-07-31 (×2): 10 mg via ORAL
  Filled 2016-07-30 (×2): qty 2

## 2016-07-30 MED ORDER — OXCARBAZEPINE 150 MG PO TABS
150.0000 mg | ORAL_TABLET | Freq: Two times a day (BID) | ORAL | Status: DC
  Administered 2016-07-30 – 2016-07-31 (×2): 150 mg via ORAL
  Filled 2016-07-30 (×2): qty 1

## 2016-07-30 MED ORDER — LORAZEPAM 1 MG PO TABS
1.0000 mg | ORAL_TABLET | Freq: Four times a day (QID) | ORAL | Status: DC | PRN
  Administered 2016-07-30 – 2016-07-31 (×3): 1 mg via ORAL
  Filled 2016-07-30 (×3): qty 1

## 2016-07-30 MED ORDER — DULOXETINE HCL 60 MG PO CPEP
60.0000 mg | ORAL_CAPSULE | Freq: Two times a day (BID) | ORAL | Status: DC
  Administered 2016-07-31: 60 mg via ORAL
  Filled 2016-07-30: qty 1

## 2016-07-30 NOTE — Progress Notes (Signed)
Patient B/P upon arrival to the unit 88/47. Normal saline at 125 ml /hr. Rechecked B/P 88/45. MD notified and made aware.

## 2016-07-30 NOTE — Progress Notes (Signed)
Pt refused bed alarm on. Will continue to do hourly rounding. 

## 2016-07-30 NOTE — Consult Note (Signed)
[facility] Consult   Reason for Consult:  Medication management for mood disorder Referring Physician:  Dr. Algis Liming Patient Identification: [name] MRN:  [id number] Principal Diagnosis: Symptomatic bradycardia Diagnosis:   Patient Active Problem List   Diagnosis Date Noted  . Fall [W19.XXXA]   . Symptomatic bradycardia [R00.1] 07/29/2016  . Hypotension [I95.9] 07/29/2016  . Smoker [F17.200] 07/29/2016  . Bipolar disorder ([place]) [F31.9]     Total Time spent with patient: 45 minutes  Subjective:   [name] is a 45 y.o. female patient admitted with lightheadedness and multiple falls at home.  HPI:  [name] is a 45 y.o. female, seen, chart reviewed for this face-to-face psychiatric consultation of medication management of bipolar disorder and recent drug drug interaction with Cipro, antibiotics used for finger injury. Patient has been taking the Cipro since December 2 and has been suffering with the lightheadedness and multiple falls which limits this hospitalization. Reportedly patient medications Cipro has been stopped more than 24 hours ago and has been placed on IV fluids for bradycardia and hypotension. Patient is awake, alert, oriented to time place person and situation. Patient denied current symptoms of depression, anxiety, bipolar mania, auditory/visual hallucinations, delusions or paranoia. Patient sister mom and dad were at bedside during this evaluation with the patient consent. Patient is willing to restart her medication methylphenidate 10 mg twice daily for attention deficit hyperactivity disorder and mood stabilizer OxyContin was been 150 m twice daily and duloxetine 60 mg twice daily for mood swings. Patient also using Cariprazine which is not available in the hospital pharmacy. Patient denies current self harming behaviors, suicidal homicidal thoughts and contract for safety while in the hospital.  Medical history: Patient with medical history  significant of BPD, on a number of psychiatric meds.  Recently put on cipro over this past week for a finger lac repair.  Took last dose yesterday.  Presents to [facility] today with a history of generalized weakness, lightheadedness, multiple falls at home.  Symptoms persistent and worsening over past couple of days.  Nothing makes better or worse.  EMS BP 60s, HR upper 30s low 40s.    Past Psychiatric History: Patient has been diagnosed with attention deficit after disorder, bipolar disorder and borderline personality disorder. Patient has [facility] and counseling services from Barbee Cough, at her private physician in [place].  Risk to Self: Is patient at risk for suicide?: No Risk to Others:   Prior Inpatient Therapy:   Prior Outpatient Therapy:    Past Medical History:  Past Medical History:  Diagnosis Date  . Anxiety   . Bipolar disorder ([place])   . Depression   . History of kidney stones   . Seizures ([place]) ~ 2009   "from taking Wellbutrin"  . Symptomatic bradycardia     Past Surgical History:  Procedure Laterality Date  . COLONOSCOPY  05/2001   Archie Endo 06/04/2001  . ESOPHAGOGASTRODUODENOSCOPY  11/2002   Archie Endo 12/05/2002  . EYE SURGERY    . LAPAROSCOPIC CHOLECYSTECTOMY  12/2002  . LITHOTRIPSY  X [place]; 1997   "both eyes; right eye"  . TONSILLECTOMY  1980   Family History: History reviewed. No pertinent family history. Family Psychiatric  History: Patient has 3 teenaged sons who are 43, 36 and 29 years old suffering with the has progressed disorder and attention deficit hyperactive disorder. Social History:  History  Alcohol Use  . 1.2 oz/week  .  1 Glasses of wine, 1 Shots of liquor per week     History  Drug Use No    Social History   Social History  . Marital status: Divorced    Spouse name: N/A  . Number of children: N/A  . Years of education: N/A    Social History Main Topics  . Smoking status: Current Some Day Smoker    Years: 12.00  . Smokeless tobacco: Never Used  . Alcohol use 1.2 oz/week    1 Glasses of wine, 1 Shots of liquor per week  . Drug use: No  . Sexual activity: Yes   Other Topics Concern  . None   Social History Narrative  . None   Additional Social History:    Allergies:   Allergies  Allergen Reactions  . Ambien [Zolpidem Tartrate]     Seizure   . Wellbutrin [Bupropion]     Seizure    Labs:  Results for orders placed or performed during the hospital encounter of 07/29/16 (from the past 48 hour(s))  CBC with Differential     Status: None   Collection Time: 07/29/16  2:15 PM  Result Value Ref Range   WBC 9.3 4.0 - 10.5 K/uL   RBC 4.65 3.87 - 5.11 MIL/uL   Hemoglobin 13.8 12.0 - 15.0 g/dL   HCT 41.1 36.0 - 46.0 %   MCV 88.4 78.0 - 100.0 fL   MCH 29.7 26.0 - 34.0 pg   MCHC 33.6 30.0 - 36.0 g/dL   RDW 13.3 11.5 - 15.5 %   Platelets 320 150 - 400 K/uL   Neutrophils Relative % 58 %   Neutro Abs 5.3 1.7 - 7.7 K/uL   Lymphocytes Relative 31 %   Lymphs Abs 2.9 0.7 - 4.0 K/uL   Monocytes Relative 8 %   Monocytes Absolute 0.8 0.1 - 1.0 K/uL   Eosinophils Relative 3 %   Eosinophils Absolute 0.3 0.0 - 0.7 K/uL   Basophils Relative 0 %   Basophils Absolute 0.0 0.0 - 0.1 K/uL  Comprehensive metabolic panel     Status: Abnormal   Collection Time: 07/29/16  2:15 PM  Result Value Ref Range   Sodium 135 135 - 145 mmol/L   Potassium 4.0 3.5 - 5.1 mmol/L   Chloride 105 101 - 111 mmol/L   CO2 22 22 - 32 mmol/L   Glucose, Bld 159 (H) 65 - 99 mg/dL   BUN 9 6 - 20 mg/dL   Creatinine, Ser 0.86 0.44 - 1.00 mg/dL   Calcium 9.4 8.9 - 10.3 mg/dL   Total Protein 7.1 6.5 - 8.1 g/dL   Albumin 3.8 3.5 - 5.0 g/dL   AST 13 (L) 15 - 41 U/L   ALT 9 (L) 14 - 54 U/L   Alkaline Phosphatase 97 38 - 126 U/L   Total Bilirubin 0.5 0.3 - 1.2 mg/dL   GFR calc non Af Amer >60 >60 mL/min   GFR calc Af Amer >60 >60 mL/min     Comment: (NOTE) The eGFR has been calculated using the CKD EPI equation. This calculation has not been validated in all clinical situations. eGFR's persistently <60 mL/min signify possible Chronic Kidney Disease.    Anion gap 8 5 - 15  Troponin I     Status: None   Collection Time: 07/29/16  2:15 PM  Result Value Ref Range   Troponin I <0.03 <0.03 ng/mL  D-dimer, quantitative (not at [facility])     Status: None   Collection  Time: 07/29/16  2:15 PM  Result Value Ref Range   D-Dimer, Quant <0.27 0.00 - 0.50 ug/mL-FEU    Comment: (NOTE) At the manufacturer cut-off of 0.50 ug/mL FEU, this assay has been documented to exclude PE with a sensitivity and negative predictive value of 97 to 99%.  At this time, this assay has not been approved by the FDA to exclude DVT/VTE. Results should be correlated with clinical presentation.   Protime-INR     Status: None   Collection Time: 07/29/16  2:15 PM  Result Value Ref Range   Prothrombin Time 13.2 11.4 - 15.2 seconds   INR 1.00   Blood culture (routine x 2)     Status: None (Preliminary result)   Collection Time: 07/29/16  2:25 PM  Result Value Ref Range   Specimen Description BLOOD RIGHT ARM    Special Requests BOTTLES DRAWN AEROBIC AND ANAEROBIC 10CC EACH    Culture      NO GROWTH < 24 HOURS Performed at [facility]    Report Status PENDING   Blood culture (routine x 2)     Status: None (Preliminary result)   Collection Time: 07/29/16  2:30 PM  Result Value Ref Range   Specimen Description BLOOD LEFT ARM    Special Requests BOTTLES DRAWN AEROBIC AND ANAEROBIC 5CC EACH    Culture      NO GROWTH < 24 HOURS Performed at [facility]    Report Status PENDING   I-Stat CG4 Lactic Acid, ED     Status: None   Collection Time: 07/29/16  2:32 PM  Result Value Ref Range   Lactic Acid, Venous 0.91 0.5 - 1.9 mmol/L  hCG, serum, qualitative     Status: None   Collection Time: 07/29/16  4:08 PM  Result Value Ref Range   Preg,  Serum NEGATIVE NEGATIVE    Comment:        THE SENSITIVITY OF THIS METHODOLOGY IS >10 mIU/mL.   Urinalysis, Routine w reflex microscopic     Status: Abnormal   Collection Time: 07/29/16 10:34 PM  Result Value Ref Range   Color, Urine YELLOW YELLOW   APPearance CLEAR CLEAR   Specific Gravity, Urine 1.010 1.005 - 1.030   pH 6.0 5.0 - 8.0   Glucose, UA NEGATIVE NEGATIVE mg/dL   Hgb urine dipstick MODERATE (A) NEGATIVE   Bilirubin Urine NEGATIVE NEGATIVE   Ketones, ur NEGATIVE NEGATIVE mg/dL   Protein, ur NEGATIVE NEGATIVE mg/dL   Nitrite NEGATIVE NEGATIVE   Leukocytes, UA NEGATIVE NEGATIVE  Urinalysis, Microscopic (reflex)     Status: Abnormal   Collection Time: 07/29/16 10:34 PM  Result Value Ref Range   RBC / HPF 6-30 0 - 5 RBC/hpf   WBC, UA 0-5 0 - 5 WBC/hpf   Bacteria, UA MANY (A) NONE SEEN   Squamous Epithelial / LPF 6-30 (A) NONE SEEN   Mucous PRESENT    Ca Oxalate Crys, UA PRESENT   Basic metabolic panel     Status: Abnormal   Collection Time: 07/30/16  4:39 AM  Result Value Ref Range   Sodium 137 135 - 145 mmol/L   Potassium 3.7 3.5 - 5.1 mmol/L   Chloride 113 (H) 101 - 111 mmol/L   CO2 19 (L) 22 - 32 mmol/L   Glucose, Bld 116 (H) 65 - 99 mg/dL   BUN 7 6 - 20 mg/dL   Creatinine, Ser 0.77 0.44 - 1.00 mg/dL   Calcium 8.0 (L) 8.9 - 10.3  mg/dL   GFR calc non Af Amer >60 >60 mL/min   GFR calc Af Amer >60 >60 mL/min    Comment: (NOTE) The eGFR has been calculated using the CKD EPI equation. This calculation has not been validated in all clinical situations. eGFR's persistently <60 mL/min signify possible Chronic Kidney Disease.    Anion gap 5 5 - 15    Current Facility-Administered Medications  Medication Dose Route Frequency Provider Last Rate Last Dose  . 0.9 %  sodium chloride infusion   Intravenous Continuous Modena Jansky, MD      . DULoxetine (CYMBALTA) DR capsule 60 mg  60 mg Oral BID Ambrose Finland, MD      . enoxaparin (LOVENOX) injection 40  mg  40 mg Subcutaneous Q24H Etta Quill, DO   40 mg at 07/29/16 2159  . LORazepam (ATIVAN) tablet 1 mg  1 mg Oral Q6H PRN Modena Jansky, MD      . methylphenidate (RITALIN) tablet 10 mg  10 mg Oral BID Ambrose Finland, MD      . OXcarbazepine (TRILEPTAL) tablet 150 mg  150 mg Oral BID Ambrose Finland, MD      . sodium chloride flush (NS) 0.9 % injection 3 mL  3 mL Intravenous Q12H Etta Quill, DO   3 mL at 07/30/16 1000    Musculoskeletal: Strength & Muscle Tone: decreased Gait & Station: unable to stand Patient leans: N/A  Psychiatric Specialty Exam: Physical Exam as per history and physical   ROS generalized weakness, bradycardia and dizziness  No Fever-chills, No Headache, No changes with Vision or hearing, reports vertigo No problems swallowing food or Liquids, No Chest pain, Cough or Shortness of Breath, No Abdominal pain, No Nausea or Vommitting, Bowel movements are regular, No Blood in stool or Urine, No dysuria, No new skin rashes or bruises, No new joints pains-aches,  No new weakness, tingling, numbness in any extremity, No recent weight gain or loss, No polyuria, polydypsia or polyphagia,   A full 10 point Review of Systems was done, except as stated above, all other Review of Systems were negative.  Blood pressure (!) 89/68, pulse 74, temperature 98.1 F (36.7 C), temperature source Oral, resp. rate 16, height 5' 5"  (1.651 m), weight 80.9 kg (178 lb 6.4 oz), last menstrual period 07/05/2016, SpO2 96 %.Body mass index is 29.69 kg/m.  Psychiatric Specialty Exam: Physical Exam as per history and physical   ROS denied symptoms of depression, anxiety and psychosis. Patient is willing to follow with current medications and outpatient ligation management. Patient has chest pain on arrival which has been controlled with medication treatment. No Fever-chills, No Headache, No changes with Vision or hearing, reports vertigo No problems swallowing  food or Liquids, No Chest pain, Cough or Shortness of Breath, No Abdominal pain, No Nausea or Vommitting, Bowel movements are regular, No Blood in stool or Urine, No dysuria, No new skin rashes or bruises, No new joints pains-aches,  No new weakness, tingling, numbness in any extremity, No recent weight gain or loss, No polyuria, polydypsia or polyphagia,   A full 10 point Review of Systems was done, except as stated above, all other Review of Systems were negative.  Blood pressure (!) 142/74, pulse 78, temperature 98.3 F (36.8 C), temperature source Oral, resp. rate 16, height 5' 4"  (1.626 m), weight 106.9 kg (235 lb 9.6 oz), SpO2 96 %.Body mass index is 40.44 kg/m.  General Appearance: Casual  Eye Contact:  Good  Speech:  Clear and Coherent  Volume:  Normal  Mood:  Depressed  Affect:  Appropriate and Congruent  Thought Process:  Coherent and Goal Directed  Orientation:  Full (Time, Place, and Person)  Thought Content:  WDL  Suicidal Thoughts:  No  Homicidal Thoughts:  No  Memory:  Immediate;   Good Recent;   Fair Remote;   Fair  Judgement:  Intact  Insight:  Good  Psychomotor Activity:  Decreased  Concentration:  Concentration: Good and Attention Span: Good  Recall:  Good  Fund of Knowledge:  Good  Language:  Good  Akathisia:  Yes  Handed:  Right  AIMS (if indicated):     Assets:  Communication Skills Desire for Improvement Financial Resources/Insurance Housing Leisure Time Resilience Social Support Transportation  ADL's:  Intact  Cognition:  WNL  Sleep:            Treatment Plan Summary: 45 years old female with a diagnosis of attention deficit hyperactive disorder, borderline personality disorder and bipolar disorder presented with generalized weakness, hypotension, bradycardia and frequent falls since antibiotics Cipro has been started. It is determined she has a drug and drug reaction between Cipro and Trileptal. Her Cipro has been stopped and she has  been dehydrated with IV fluids. Patient has been emotionally and behaviorally stable during this evaluation. Patient has no safety concerns  Patient does not meet criteria for acute psychiatric hospitalization Patient family has been very supportive to her Patient will restart her medication methylphenidate 10 mg twice daily for inattention, Trileptal 150 mg twice daily for mood swings, duloxetine 60 mg twice daily for depression and Ativan as needed for anxiety Patient may restart her medication Cariprazine when she can access from home supply as [facility] does not carry on formulary Daily contact with patient to assess and evaluate symptoms and progress in treatment and Medication management  Appreciate psychiatric consultation and we sign off as of today Please contact [phone] or [phone] if needs further assistance   Disposition: Patient will be referred to the outpatient medication management and counseling services and medically stable. No evidence of imminent risk to self or others at present.   Supportive therapy provided about ongoing stressors.  Ambrose Finland, MD 07/30/2016 4:44 PM

## 2016-07-30 NOTE — Progress Notes (Signed)
    Subjective:  Feels "100% better" today. No chest pain, shortness of breath, or heart palpitations. Has been up walking around the room without dizziness.  Objective:  Vital Signs in the last 24 hours: Temp:  [97.7 F (36.5 C)-98.4 F (36.9 C)] 98.1 F (36.7 C) (12/13 1112) Pulse Rate:  [40-74] 74 (12/13 1112) Resp:  [13-23] 16 (12/13 1112) BP: (76-107)/(29-74) 89/68 (12/13 1112) SpO2:  [96 %-100 %] 96 % (12/13 0408) Weight:  [171 lb (77.6 kg)-179 lb 1.6 oz (81.2 kg)] 178 lb 6.4 oz (80.9 kg) (12/13 0500)  Intake/Output from previous day: 12/12 0701 - 12/13 0700 In: 2003 [I.V.:3; IV Piggyback:2000] Out: -   Physical Exam: Pt is alert and oriented, NAD HEENT: normal Neck: JVP - normal Lungs: CTA bilaterally CV: RRR without murmur or gallop Abd: soft, NT, Positive BS, no hepatomegaly Ext: no C/C/E, distal pulses intact and equal Skin: warm/dry no rash   Lab Results:  Recent Labs  07/29/16 1415  WBC 9.3  HGB 13.8  PLT 320    Recent Labs  07/29/16 1415 07/30/16 0439  NA 135 137  K 4.0 3.7  CL 105 113*  CO2 22 19*  GLUCOSE 159* 116*  BUN 9 7  CREATININE 0.86 0.77    Recent Labs  07/29/16 1415  TROPONINI <0.03   Tele: Periods of junctional bradycardia, now in sinus rhythm 60 bpm  Assessment/Plan:  Symptomatic bradycardia/hypotension: Patient markedly improved with discontinuation of Cipro and stopping other medications. Heart rhythm is now sinus and blood pressure is improved. It sounds like she runs a low blood pressure even at baseline and I discussed the importance of adequate fluid hydration and adequate sodium intake. No further cardiac testing is indicated at this time. Please call if any further problems arise.   Tonny Bollmanooper, Gregor Dershem, M.D. 07/30/2016, 1:56 PM

## 2016-07-30 NOTE — Progress Notes (Addendum)
PROGRESS NOTE  [name]  ZOX:[id number] DOB: 20-Dec-1970  DOA: 07/29/2016 PCP: Bosie ClosICE,KATHLEEN M, MD   Brief Narrative:  45 year old female with PMH of bipolar disorder (sees Ocie BobLisa Poulis, NP) no recent medication changes, completed course of Cipro on day of this admission for a finger laceration, [facility] with complaints of generalized weakness, dizziness, lightheadedness, multiple falls at home without syncope or injuries sustained. Per EMS BP in the 60s, heart rate in the upper 30s and low 40s. Admitted for symptomatic hypotension, bradycardia. Cardiology consulted.   Assessment & Plan:   Principal Problem:   Symptomatic bradycardia Active Problems:   Hypotension   Bipolar disorder (HCC)   Smoker   1. Symptomatic bradycardia and hypotension/orthostatic hypotension: At baseline patient apparently runs low blood pressures. She gives recent history of poor oral intake, possibly related to depression. She was placed on Cipro for finger laceration which she completed on 07/29/16. It was felt that her bradycardia and hypotension were related to Cipro interacting with polypharmacy psychiatric medications. As stated above, she has completed Cipro. Bradycardia and hypotension have improved. Cardiology have seen and signed off and recommended no further cardiac testing at this time. Patient has been counseled regarding importance of adequate fluid hydration and sodium intake. Continue gentle IV fluids for additional 24 hours due to ongoing orthostatic hypotension. Check TSH. 2. Bipolar disorder/depression: Patient states that she has been more depressed than usual since her grandfather's death on 06/16/16. She denies suicidal or homicidal ideations. Psychiatric medications were held due to problem #1. Requested psychiatry to see. Resume when necessary Ativan for anxiety meanwhile. 3. Intentional weight loss: Patient states that she voluntarily lost approximately 52 pounds since  May 2016. This may also be contributing to her baseline low blood pressures. 4. Multiple falls: May be related to problem #1. We'll get PT to evaluate. CT head negative.   DVT prophylaxis: Lovenox Code Status: Full Family Communication: None at bedside Disposition Plan: DC home possibly 07/31/16.   Consultants:   Cardiology  Psychiatry  Procedures:   None  Antimicrobials:   None    Subjective: Feels much better. Denies dizziness but had not got out of bed. As per RN, mild dizziness on standing and was orthostatic (BP 87/33 supine >58/50 standing). Complains of feeling depressed since her grandfather's demise on 06/16/16. Poor appetite.  Objective:  Vitals:   07/30/16 0100 07/30/16 0408 07/30/16 0500 07/30/16 1112  BP: (!) 89/49 (!) 80/37  (!) 89/68  Pulse: (!) 52 (!) 58  74  Resp: 15 16  16   Temp: 98.1 F (36.7 C) 98.4 F (36.9 C)  98.1 F (36.7 C)  TempSrc: Oral Oral  Oral  SpO2: 99% 96%    Weight:   80.9 kg (178 lb 6.4 oz)   Height:        Intake/Output Summary (Last 24 hours) at 07/30/16 1442 Last data filed at 07/30/16 1214  Gross per 24 hour  Intake             2003 ml  Output              900 ml  Net             1103 ml   Filed Weights   07/29/16 1359 07/29/16 1907 07/30/16 0500  Weight: 77.6 kg (171 lb) 81.2 kg (179 lb 1.6 oz) 80.9 kg (178 lb 6.4 oz)    Examination:  General exam: Pleasant young female lying comfortably supine in bed. Respiratory system:  Clear to auscultation. Respiratory effort normal. Cardiovascular system: S1 & S2 heard, RRR. No JVD, murmurs, rubs, gallops or clicks. No pedal edema. Telemetry: Sinus bradycardia in the 50s. Gastrointestinal system: Abdomen is nondistended, soft and nontender. No organomegaly or masses felt. Normal bowel sounds heard. Central nervous system: Alert and oriented. No focal neurological deficits. Extremities: Symmetric 5 x 5 power. Skin: No rashes, lesions or ulcers Psychiatry: Judgement and  insight appear normal. Mood & affect slightly depressed.     Data Reviewed: I have personally reviewed following labs and imaging studies  CBC:  Recent Labs Lab 07/29/16 1415  WBC 9.3  NEUTROABS 5.3  HGB 13.8  HCT 41.1  MCV 88.4  PLT 320   Basic Metabolic Panel:  Recent Labs Lab 07/29/16 1415 07/30/16 0439  NA 135 137  K 4.0 3.7  CL 105 113*  CO2 22 19*  GLUCOSE 159* 116*  BUN 9 7  CREATININE 0.86 0.77  CALCIUM 9.4 8.0*   GFR: Estimated Creatinine Clearance: 94.4 mL/min (by C-G formula based on SCr of 0.77 mg/dL). Liver Function Tests:  Recent Labs Lab 07/29/16 1415  AST 13*  ALT 9*  ALKPHOS 97  BILITOT 0.5  PROT 7.1  ALBUMIN 3.8   No results for input(s): LIPASE, AMYLASE in the last 168 hours. No results for input(s): AMMONIA in the last 168 hours. Coagulation Profile:  Recent Labs Lab 07/29/16 1415  INR 1.00   Cardiac Enzymes:  Recent Labs Lab 07/29/16 1415  TROPONINI <0.03   BNP (last 3 results) No results for input(s): PROBNP in the last 8760 hours. HbA1C: No results for input(s): HGBA1C in the last 72 hours. CBG: No results for input(s): GLUCAP in the last 168 hours. Lipid Profile: No results for input(s): CHOL, HDL, LDLCALC, TRIG, CHOLHDL, LDLDIRECT in the last 72 hours. Thyroid Function Tests: No results for input(s): TSH, T4TOTAL, FREET4, T3FREE, THYROIDAB in the last 72 hours. Anemia Panel: No results for input(s): VITAMINB12, FOLATE, FERRITIN, TIBC, IRON, RETICCTPCT in the last 72 hours.  Sepsis Labs:  Recent Labs Lab 07/29/16 1432  LATICACIDVEN 0.91    No results found for this or any previous visit (from the past 240 hour(s)).       Radiology Studies: Dg Chest 1 View  Result Date: 07/29/2016 CLINICAL DATA:  Status post fall last night and twice today possibly row due to medication reaction. Patient was found read bradycardic and hypotensive on arrival to the emergency department. No report of chest pain or  difficulty breathing. EXAM: CHEST 1 VIEW COMPARISON:  Chest x-ray of October 23, 2014 and CT scan of the chest of November 05, 2015. FINDINGS: The lungs are well-expanded and clear. There is no pneumothorax, pneumomediastinum, or pleural effusion. The heart and mediastinal structures are normal. There is no pulmonary vascular congestion. The bony thorax exhibits no acute abnormality. IMPRESSION: There is no active cardiopulmonary disease. Electronically Signed   By: David  [place] M.D.   On: 07/29/2016 15:08   Ct Head Wo Contrast  Result Date: 07/29/2016 CLINICAL DATA:  Syncope.  Fell and hit head. EXAM: CT HEAD WITHOUT CONTRAST TECHNIQUE: Contiguous axial images were obtained from the base of the skull through the vertex without intravenous contrast. COMPARISON:  CT head 10/02/2007 FINDINGS: Brain: No evidence of acute infarction, hemorrhage, hydrocephalus, extra-axial collection or mass lesion/mass effect. Vascular: No hyperdense vessel or unexpected calcification. Skull: Negative Sinuses/Orbits: Negative Other: None IMPRESSION: Negative CT of the head. Electronically Signed   By: Marlan Palau M.D.  On: 07/29/2016 15:08        Scheduled Meds: . enoxaparin (LOVENOX) injection  40 mg Subcutaneous Q24H  . sodium chloride flush  3 mL Intravenous Q12H   Continuous Infusions: . sodium chloride 125 mL/hr at 07/30/16 1439     LOS: 1 day       [facility] Hospital[facility]ALGI,Bristol Soy, MD [facility] Pager [phone]  If 7PM-7AM, please contact night-coverage [email or web address] Password [email or web address] 07/30/2016, 2:42 PM

## 2016-07-31 DIAGNOSIS — F3130 Bipolar disorder, current episode depressed, mild or moderate severity, unspecified: Secondary | ICD-10-CM

## 2016-07-31 DIAGNOSIS — I9589 Other hypotension: Secondary | ICD-10-CM

## 2016-07-31 LAB — BASIC METABOLIC PANEL
Anion gap: 6 (ref 5–15)
BUN: 5 mg/dL — AB (ref 6–20)
CHLORIDE: 111 mmol/L (ref 101–111)
CO2: 23 mmol/L (ref 22–32)
Calcium: 8.5 mg/dL — ABNORMAL LOW (ref 8.9–10.3)
Creatinine, Ser: 0.71 mg/dL (ref 0.44–1.00)
GFR calc Af Amer: >60 mL/min (ref >60)
GFR calc non Af Amer: >60 mL/min (ref >60)
Glucose, Bld: 88 mg/dL (ref 65–99)
POTASSIUM: 3.9 mmol/L (ref 3.5–5.1)
SODIUM: 140 mmol/L (ref 135–145)

## 2016-07-31 LAB — URINE CULTURE

## 2016-07-31 MED ORDER — IBUPROFEN 800 MG PO TABS: 800.0000 mg | ORAL_TABLET | Freq: Three times a day (TID) | ORAL | Status: DC | PRN

## 2016-07-31 NOTE — Discharge Instructions (Signed)
Hypotension Hypotension, commonly called low blood pressure, is when the force of blood pumping through your arteries is too weak. Arteries are blood vessels that carry blood from the heart throughout the body. When blood pressure is too low, you may not get enough blood to your brain or to the rest of your organs. This can cause weakness, light-headedness, rapid heartbeat, and fainting. Depending on the cause and severity, hypotension may be harmless (benign) or cause serious problems (critical). What are the causes? Possible causes of hypertension include:  Blood loss.  Loss of body fluids (dehydration).  Heart problems.  Hormone (endocrine) problems.  Pregnancy.  Severe infection.  Lack of certain nutrients.  Severe allergic reactions (anaphylaxis).  Certain medicines, such as blood pressure medicine or medicines that make the body lose excess fluids (diuretics). Sometimes, hypotension can be caused by not taking medicine as directed, such as taking too much of a certain medicine. What increases the risk? Certain factors can make you more likely to develop hypotension, including:  Age. Risk increases as you get older.  Conditions that affect the heart or the central nervous system.  Taking certain medicines, such as blood pressure medicine or diuretics.  Being pregnant. What are the signs or symptoms? Symptoms of this condition may include:  Weakness.  Light-headedness.  Dizziness.  Blurred vision.  Fatigue.  Rapid heartbeat.  Fainting, in severe cases. How is this diagnosed? This condition is diagnosed based on:  Your medical history.  Your symptoms.  Your blood pressure measurement. Your health care provider will check your blood pressure when you are:  Lying down.  Sitting.  Standing. A blood pressure reading is recorded as two numbers, such as "120 over 80" (or 120/80). The first ("top") number is called the systolic pressure. It is a measure of  the pressure in your arteries as your heart beats. The second ("bottom") number is called the diastolic pressure. It is a measure of the pressure in your arteries when your heart relaxes between beats. Blood pressure is measured in a unit called mm Hg. Healthy blood pressure for adults is 120/80. If your blood pressure is below 90/60, you may be diagnosed with hypotension. Other information or tests that may be used to diagnose hypotension include:  Your other vital signs, such as your heart rate and temperature.  Blood tests.  Tilt table test. For this test, you will be safely secured to a table that moves you from a lying position to an upright position. Your heart rhythm and blood pressure will be monitored during the test. How is this treated? Treatment for this condition may include:  Changing your diet. This may involve eating more salt (sodium) or drinking more water.  Taking medicines to raise your blood pressure.  Changing the dosage of certain medicines you are taking that might be lowering your blood pressure.  Wearing compression stockings. These stockings help to prevent blood clots and reduce swelling in your legs. In some cases, you may need to go to the hospital for:  Fluid replacement. This means you will receive fluids through an IV tube.  Blood replacement. This means you will receive donated blood through an IV tube (transfusion).  Treating an infection or heart problems, if this applies.  Monitoring. You may need to be monitored while medicines that you are taking wear off. Follow these instructions at home: Eating and drinking  Drink enough fluid to keep your urine clear or pale yellow.  Eat a healthy diet and follow instructions from   your health care provider about eating or drinking restrictions. A healthy diet includes:  Fresh fruits and vegetables.  Whole grains.  Lean meats.  Low-fat dairy products.  Eat extra salt only as directed. Do not add  extra salt to your diet unless your health care provider told you to do that.  Eat frequent, small meals.  Avoid standing up suddenly after eating. Medicines  Take over-the-counter and prescription medicines only as told by your health care provider.  Follow instructions from your health care provider about changing the dosage of your current medicines, if this applies.  Do not stop or adjust any of your medicines on your own. General instructions  Wear compression stockings as told by your health care provider.  Get up slowly from lying down or sitting positions. This gives your blood pressure a chance to adjust.  Avoid hot showers and excessive heat as directed by your health care provider.  Return to your normal activities as told by your health care provider. Ask your health care provider what activities are safe for you.  Do not use any products that contain nicotine or tobacco, such as cigarettes and e-cigarettes. If you need help quitting, ask your health care provider.  Keep all follow-up visits as told by your health care provider. This is important. Contact a health care provider if:  You vomit.  You have diarrhea.  You have a fever for more than 2-3 days.  You feel more thirsty than usual.  You feel weak and tired. Get help right away if:  You have chest pain.  You have a fast or irregular heartbeat.  You develop numbness in any part of your body.  You cannot move your arms or your legs.  You have trouble speaking.  You become sweaty or feel light-headed.  You faint.  You feel short of breath.  You have trouble staying awake.  You feel confused. This information is not intended to replace advice given to you by your health care provider. Make sure you discuss any questions you have with your health care provider. Document Released: 08/04/2005 Document Revised: 02/22/2016 Document Reviewed: 01/24/2016 Elsevier Interactive Patient Education  2017  [employer]   Bradycardia, Adult Bradycardia is a slower-than-normal heartbeat. A normal resting heart rate for an adult ranges from 60 to 100 beats per minute. With bradycardia, the resting heart rate is less than 60 beats per minute. Bradycardia can prevent enough oxygen from reaching certain areas of your body when you are active. It can be serious if it keeps enough oxygen from reaching your brain and other parts of your body. Bradycardia is not a problem for everyone. For some healthy adults, a slow resting heart rate is normal. What are the causes? This condition may be caused by:  A problem with the heart, including:  A problem with the heart's electrical system, such as a heart block.  A problem with the heart's natural pacemaker (sinus node).  Heart disease.  A heart attack.  Heart damage.  A heart infection.  A heart condition that is present at birth (congenital heart defect).  Certain medicines that treat heart conditions.  Certain conditions, such as hypothyroidism and obstructive sleep apnea.  Problems with the balance of chemicals and other substances, like potassium, in the blood. What increases the risk? This condition is more likely to develop in adults who:  Are age 44 or older.  Have high blood pressure (hypertension), high cholesterol (hyperlipidemia), or diabetes.  Drink heavily, use tobacco  or nicotine products, or use drugs.  Are stressed. What are the signs or symptoms? Symptoms of this condition include:  Light-headedness.  Feeling faint or fainting.  Fatigue and weakness.  Shortness of breath.  Chest pain (angina).  Drowsiness.  Confusion.  Dizziness. How is this diagnosed? This condition may be diagnosed based on:  Your symptoms.  Your medical history.  A physical exam. During the exam, your health care provider will listen to your heartbeat and check your pulse. To confirm the diagnosis, your health care provider may  order tests, such as:  Blood tests.  An electrocardiogram (ECG). This test records the heart's electrical activity. The test can show how fast your heart is beating and whether the heartbeat is steady.  A test in which you wear a portable device (event recorder or Holter monitor) to record your heart's electrical activity while you go about your day.  Anexercise test. How is this treated? Treatment for this condition depends on the cause of the condition and how severe your symptoms are. Treatment may involve:  Treatment of the underlying condition.  Changing your medicines or how much medicine you take.  Having a small, battery-operated device called a pacemaker implanted under the skin. When bradycardia occurs, this device can be used to increase your heart rate and help your heart to beat in a regular rhythm. Follow these instructions at home: Lifestyle  Manage any health conditions that contribute to bradycardia as told by your health care provider.  Follow a heart-healthy diet. A nutrition specialist (dietitian) can help to educate you about healthy food options and changes.  Follow an exercise program that is approved by your health care provider.  Maintain a healthy weight.  Try to reduce or manage your stress, such as with yoga or meditation. If you need help reducing stress, ask your health care provider.  Do not use use any products that contain nicotine or tobacco, such as cigarettes and e-cigarettes. If you need help quitting, ask your health care provider.  Do not use illegal drugs.  Limit alcohol intake to no more than 1 drink per day for nonpregnant women and 2 drinks per day for men. One drink equals 12 oz of beer, 5 oz of wine, or 1 oz of hard liquor. General instructions  Take over-the-counter and prescription medicines only as told by your health care provider.  Keep all follow-up visits as directed by your health care provider. This is important. How is  this prevented? In some cases, bradycardia may be prevented by:  Treating underlying medical problems.  Stopping behaviors or medicines that can trigger the condition. Contact a health care provider if:  You feel light-headed or dizzy.  You almost faint.  You feel weak or are easily fatigued during physical activity.  You experience confusion or have memory problems. Get help right away if:  You faint.  You have an irregular heartbeat (palpitations).  You have chest pain.  You have trouble breathing. This information is not intended to replace advice given to you by your health care provider. Make sure you discuss any questions you have with your health care provider. Document Released: 04/26/2002 Document Revised: 04/01/2016 Document Reviewed: 01/24/2016 Elsevier Interactive Patient Education  2017 [employer]

## 2016-07-31 NOTE — Progress Notes (Signed)
Pt refused to have bed alarm on. Encourage to call for assistance as needed.  Verbalized understanding.  Edem Tiegs,RN.

## 2016-07-31 NOTE — Evaluation (Signed)
Physical Therapy Evaluation Patient Details Name: [name] MRN: [id number] DOB: 1971/05/12 Today's Date: 07/31/2016   History of Present Illness  Pt adm with symptomatiic bradycardia with hypotension due to medication interaction. PMH - bipolar  Clinical Impression  Pt doing well with mobility and no further PT needed.  Ready for dc from PT standpoint.      Follow Up Recommendations No PT follow up    Equipment Recommendations  None recommended by PT    Recommendations for Other Services       Precautions / Restrictions Precautions Precautions: None Restrictions Weight Bearing Restrictions: No      Mobility  Bed Mobility               General bed mobility comments: Pt up   Transfers Overall transfer level: Independent Equipment used: None                Ambulation/Gait Ambulation/Gait assistance: Independent Ambulation Distance (Feet): 1000 Feet Assistive device: None Gait Pattern/deviations: WFL(Within Functional Limits)   Gait velocity interpretation: at or above normal speed for age/gender General Gait Details: Steady gait. No dizziness or loss of balance with head turns  Information systems managertairs            Wheelchair Mobility    Modified Rankin (Stroke Patients Only)       Balance Overall balance assessment: Independent                                           Pertinent Vitals/Pain Pain Assessment: No/denies pain    Home Living Family/patient expects to be discharged to:: Private residence Living Arrangements: Children Available Help at Discharge: Family Type of Home: House Home Access: Ramped entrance     Home Layout: Two level;Able to live on main level with bedroom/bathroom Home Equipment: None      Prior Function Level of Independence: Independent               Hand Dominance        Extremity/Trunk Assessment   Upper Extremity Assessment Upper Extremity Assessment: Overall WFL for tasks  assessed    Lower Extremity Assessment Lower Extremity Assessment: Overall WFL for tasks assessed       Communication   Communication: No difficulties  Cognition Arousal/Alertness: Awake/alert Behavior During Therapy: WFL for tasks assessed/performed Overall Cognitive Status: Within Functional Limits for tasks assessed                      General Comments      Exercises     Assessment/Plan    PT Assessment Patent does not need any further PT services  PT Problem List            PT Treatment Interventions      PT Goals (Current goals can be found in the Care Plan section)  Acute Rehab PT Goals PT Goal Formulation: All assessment and education complete, DC therapy    Frequency     Barriers to discharge        Co-evaluation               End of Session   Activity Tolerance: Patient tolerated treatment well Patient left: in bed;with call bell/phone within reach           Time: 0855-0906 PT Time Calculation (min) (ACUTE ONLY): 11 min   Charges:   PT  Evaluation $PT Eval Low Complexity: 1 Procedure     PT G CodesAngelina Shields:        [name] 07/31/2016, 9:50 AM [facility] [phone]

## 2016-07-31 NOTE — Discharge Summary (Signed)
Physician Discharge Summary  Iowa Kappes RUE:[id number] DOB: 1970/11/25  PCP: Bosie Clos, MD  Admit date: 07/29/2016 Discharge date: 07/31/2016  Recommendations for Outpatient Follow-up:  1. Dr. Montey Hora, PCP in 5 days.Please follow final blood culture results that were sent from the hospital. 2. Ellis Savage, NP/Psychiatry in 2 weeks.  Home Health: None Equipment/Devices: None    Discharge Condition: Improved and stable  CODE STATUS: Full  Diet recommendation: Regular diet  Discharge Diagnoses:  Principal Problem:   Symptomatic bradycardia Active Problems:   Hypotension   Bipolar disorder (HCC)   Smoker   Fall   Brief/Interim Summary: 45 year old female with PMH of bipolar disorder (sees Ellis Savage, NP) no recent medication changes, completed course of Cipro on day of this admission for a finger laceration, presented to [facility] ED on 07/29/16 with complaints of generalized weakness, dizziness, lightheadedness, multiple falls at home without syncope or injuries sustained. Per EMS BP in the 60s, heart rate in the upper 30s and low 40s. Admitted for symptomatic hypotension, bradycardia. Cardiology consulted.   Assessment & Plan:   1. Symptomatic bradycardia and hypotension/orthostatic hypotension: At baseline patient apparently runs low blood pressures (~100/60). She gives recent history of poor oral intake, possibly related to depression. She was placed on Cipro for finger laceration which she completed on 07/29/16. It was felt that her bradycardia and hypotension were related to Cipro interacting with polypharmacy psychiatric medications. As stated above, she has completed Cipro pta. Her psychiatric medications were temporarily held and then resumed after bradycardia and hypotension had improved. She was hydrated with IV fluids. Hypotension has resolved and no further orthostatic changes either. Heart rates on telemetry ranging in the sinus bradycardia 50s-sinus rhythm.  Cardiology have seen and signed off and recommended no further cardiac testing at this time. Patient has been counseled regarding importance of adequate fluid hydration and sodium intake. TSH normal. Patient was advised that she should discuss with her providers prior to initiating any new medications regarding interaction with her other psychiatric medications. She verbalized understanding. Serum pregnancy test negative. 2. Bipolar disorder/depression: Patient stated that she has been more depressed than usual since her grandfather's death on 06-23-2016. She denies suicidal or homicidal ideations. Psychiatric medications were held due to problem #1. Psychiatry input appreciated. Discussed with psychiatrist. All home medications have been resumed. Outpatient follow-up with her primary psychiatrist.  3. Intentional weight loss: Patient states that she voluntarily lost approximately 52 pounds since May 2016. This may also be contributing to her baseline low blood pressures. 4. Multiple falls: May be related to problem #1. CT head negative.PT evaluated and see no home needs. Patient has been seen ambulating comfortably in the halls. 5. Right distal third finger laceration: Seems to have healed. Completed antibiotics prior to admission. X-ray on 12/2 showed underlying distal tuft fracture. Outpatient follow-up. 6. Microscopic hematuria: Unclear etiology. Outpatient follow-up and evaluation as deemed necessary.    Consultants:   Cardiology  Psychiatry  Procedures:   None   Discharge Instructions  Discharge Instructions    Activity as tolerated - No restrictions    Complete by:  As directed    Call MD for:  extreme fatigue    Complete by:  As directed    Call MD for:  persistant dizziness or light-headedness    Complete by:  As directed    Diet general    Complete by:  As directed        Medication List    TAKE these medications   Cariprazine  HCl 3 MG Caps Take 1 capsule by mouth  daily.   DULoxetine 60 MG capsule Commonly known as:  CYMBALTA Take 60 mg by mouth 2 (two) times daily.   ibuprofen 800 MG tablet Commonly known as:  ADVIL,MOTRIN Take 1 tablet (800 mg total) by mouth every 8 (eight) hours as needed for headache, mild pain or moderate pain. What changed:  when to take this  reasons to take this   LORazepam 1 MG tablet Commonly known as:  ATIVAN Take 1 mg by mouth every 6 (six) hours as needed for anxiety.   methylphenidate 20 MG tablet Commonly known as:  RITALIN Take 20 mg by mouth daily.   MULTI-VITAMIN DAILY PO Take by mouth.   OXcarbazepine 150 MG tablet Commonly known as:  TRILEPTAL Take 150 mg by mouth 2 (two) times daily.      Follow-up Information    Bosie Clos, MD. Schedule an appointment as soon as possible for a visit in 5 day(s).   Specialty:  Family Medicine Contact information: [address] [place] [address] [phone]        Ellis Savage, NP. Schedule an appointment as soon as possible for a visit in 2 week(s).   Contact information: [address] [place] [address] [phone]          Allergies  Allergen Reactions  . Ambien [Zolpidem Tartrate]     Seizure   . Wellbutrin [Bupropion]     Seizure     Procedures/Studies: Dg Chest 1 View  Result Date: 07/29/2016 CLINICAL DATA:  Status post fall last night and twice today possibly row due to medication reaction. Patient was found read bradycardic and hypotensive on arrival to the emergency department. No report of chest pain or difficulty breathing. EXAM: CHEST 1 VIEW COMPARISON:  Chest x-ray of October 23, 2014 and CT scan of the chest of November 05, 2015. FINDINGS: The lungs are well-expanded and clear. There is no pneumothorax, pneumomediastinum, or pleural effusion. The heart and mediastinal structures are normal. There is no pulmonary vascular congestion. The bony thorax exhibits no acute abnormality. IMPRESSION: There is no  active cardiopulmonary disease. Electronically Signed   By: David  [place] M.D.   On: 07/29/2016 15:08   Ct Head Wo Contrast  Result Date: 07/29/2016 CLINICAL DATA:  Syncope.  Fell and hit head. EXAM: CT HEAD WITHOUT CONTRAST TECHNIQUE: Contiguous axial images were obtained from the base of the skull through the vertex without intravenous contrast. COMPARISON:  CT head 10/02/2007 FINDINGS: Brain: No evidence of acute infarction, hemorrhage, hydrocephalus, extra-axial collection or mass lesion/mass effect. Vascular: No hyperdense vessel or unexpected calcification. Skull: Negative Sinuses/Orbits: Negative Other: None IMPRESSION: Negative CT of the head. Electronically Signed   By: Marlan Palau M.D.   On: 07/29/2016 15:08   Dg Finger Middle Right  Result Date: 07/19/2016 CLINICAL DATA:  Pain after trauma EXAM: RIGHT MIDDLE FINGER 2+V COMPARISON:  None. FINDINGS: There is a laceration in the distal third finger. There is a fracture through the distal tuft. No foreign body. No other abnormalities. IMPRESSION: Laceration through the distal third finger soft tissues with no foreign body. There is an underlying distal tuft fracture. Electronically Signed   By: Gerome Sam III M.D   On: 07/19/2016 16:09      Subjective: Denies complaints. Has been ambulating comfortably in the halls without dizziness, lightheadedness, unsteadiness or falls. Overall feels better. No suicidal or homicidal ideations. Anxious to go home.  Discharge Exam:  Vitals:   07/30/16 2033 07/31/16 0005 07/31/16 0400 07/31/16 0817  BP: (!) 106/49 (!) 106/54 98/65 (!) 106/57  Pulse: 65 63 72 (!) 57  Resp: 18 18 18 18   Temp: 98.2 F (36.8 C) 97.9 F (36.6 C) 97.8 F (36.6 C) 97.6 F (36.4 C)  TempSrc: Oral Oral Oral Oral  SpO2: 100% 100% 100% 100%  Weight:   81.2 kg (179 lb)   Height:        General exam: Pleasant young female ambulating comfortably in the halls. Respiratory system: Clear to auscultation. Respiratory  effort normal. Cardiovascular system: S1 & S2 heard, RRR. No JVD, murmurs, rubs, gallops or clicks. No pedal edema. Telemetry: Sinus bradycardia in the 50s- SR. Gastrointestinal system: Abdomen is nondistended, soft and nontender. No organomegaly or masses felt. Normal bowel sounds heard. Central nervous system: Alert and oriented. No focal neurological deficits. Extremities: Symmetric 5 x 5 power. Skin: No rashes, lesions or ulcers Psychiatry: Judgement and insight appear normal. Mood & affect slightly depressed.     The results of significant diagnostics from this hospitalization (including imaging, microbiology, ancillary and laboratory) are listed below for reference.     Microbiology: Recent Results (from the past 240 hour(s))  Blood culture (routine x 2)     Status: None (Preliminary result)   Collection Time: 07/29/16  2:25 PM  Result Value Ref Range Status   Specimen Description BLOOD RIGHT ARM  Final   Special Requests BOTTLES DRAWN AEROBIC AND ANAEROBIC 10CC EACH  Final   Culture   Final    NO GROWTH < 24 HOURS [facility]    Report Status PENDING  Incomplete  Blood culture (routine x 2)     Status: None (Preliminary result)   Collection Time: 07/29/16  2:30 PM  Result Value Ref Range Status   Specimen Description BLOOD LEFT ARM  Final   Special Requests BOTTLES DRAWN AEROBIC AND ANAEROBIC 5CC EACH  Final   Culture   Final    NO GROWTH < 24 HOURS Performed at [facility]    Report Status PENDING  Incomplete  Culture, Urine     Status: Abnormal   Collection Time: 07/29/16 10:35 PM  Result Value Ref Range Status   Specimen Description URINE, CLEAN CATCH  Final   Special Requests NONE  Final   Culture MULTIPLE SPECIES PRESENT, SUGGEST RECOLLECTION (A)  Final   Report Status 07/31/2016 FINAL  Final     Labs: BNP (last 3 results) No results for input(s): BNP in the last 8760 hours. Basic Metabolic Panel:  Recent Labs Lab  07/29/16 1415 07/30/16 0439 07/31/16 0240  NA 135 137 140  K 4.0 3.7 3.9  CL 105 113* 111  CO2 22 19* 23  GLUCOSE 159* 116* 88  BUN 9 7 5*  CREATININE 0.86 0.77 0.71  CALCIUM 9.4 8.0* 8.5*   Liver Function Tests:  Recent Labs Lab 07/29/16 1415  AST 13*  ALT 9*  ALKPHOS 97  BILITOT 0.5  PROT 7.1  ALBUMIN 3.8   No results for input(s): LIPASE, AMYLASE in the last 168 hours. No results for input(s): AMMONIA in the last 168 hours. CBC:  Recent Labs Lab 07/29/16 1415  WBC 9.3  NEUTROABS 5.3  HGB 13.8  HCT 41.1  MCV 88.4  PLT 320   Cardiac Enzymes:  Recent Labs Lab 07/29/16 1415  TROPONINI <0.03   D-Dimer  Recent Labs  07/29/16 1415  DDIMER <0.27   Thyroid function studies  Recent Labs  07/30/16 1618  TSH 0.600    Urinalysis    Component Value Date/Time   COLORURINE YELLOW 07/29/2016 2234   APPEARANCEUR CLEAR 07/29/2016 2234   LABSPEC 1.010 07/29/2016 2234   PHURINE 6.0 07/29/2016 2234   GLUCOSEU NEGATIVE 07/29/2016 2234   HGBUR MODERATE (A) 07/29/2016 2234   BILIRUBINUR NEGATIVE 07/29/2016 2234   KETONESUR NEGATIVE 07/29/2016 2234   PROTEINUR NEGATIVE 07/29/2016 2234   UROBILINOGEN 0.2 10/23/2014 1310   NITRITE NEGATIVE 07/29/2016 2234   LEUKOCYTESUR NEGATIVE 07/29/2016 2234      Time coordinating discharge: Over 30 minutes  SIGNED:  Marcellus ScottHONGALGI,Lars Jeziorski, MD, FACP, FHM. [facility] Pager [phone]  If 7PM-7AM, please contact night-coverage [email or web address] Password [email or web address] 07/31/2016, 11:32 AM

## 2016-08-03 LAB — CULTURE, BLOOD (ROUTINE X 2)
CULTURE: NO GROWTH
Culture: NO GROWTH

## 2017-06-09 ENCOUNTER — Other Ambulatory Visit: Payer: Self-pay | Admitting: Obstetrics and Gynecology

## 2017-06-09 DIAGNOSIS — N6310 Unspecified lump in the right breast, unspecified quadrant: Secondary | ICD-10-CM

## 2017-06-15 ENCOUNTER — Other Ambulatory Visit: Payer: Self-pay

## 2017-06-24 ENCOUNTER — Other Ambulatory Visit: Payer: Self-pay

## 2017-06-30 ENCOUNTER — Other Ambulatory Visit: Payer: Self-pay | Admitting: Obstetrics and Gynecology

## 2017-06-30 ENCOUNTER — Ambulatory Visit
Admission: RE | Admit: 2017-06-30 | Discharge: 2017-06-30 | Disposition: A | Discharge status: Home / Self Care | Payer: BLUE CROSS/BLUE SHIELD | Source: Ambulatory Visit | Attending: Obstetrics and Gynecology | Admitting: Obstetrics and Gynecology

## 2017-06-30 DIAGNOSIS — N6310 Unspecified lump in the right breast, unspecified quadrant: Secondary | ICD-10-CM

## 2017-10-12 ENCOUNTER — Other Ambulatory Visit: Payer: Self-pay

## 2017-10-12 ENCOUNTER — Emergency Department (HOSPITAL_BASED_OUTPATIENT_CLINIC_OR_DEPARTMENT_OTHER)
Admission: EM | Admit: 2017-10-12 | Discharge: 2017-10-13 | Disposition: A | Discharge status: Home / Self Care | Payer: BLUE CROSS/BLUE SHIELD | Attending: Emergency Medicine | Admitting: Emergency Medicine

## 2017-10-12 ENCOUNTER — Encounter (HOSPITAL_BASED_OUTPATIENT_CLINIC_OR_DEPARTMENT_OTHER): Payer: Self-pay | Admitting: *Deleted

## 2017-10-12 DIAGNOSIS — M533 Sacrococcygeal disorders, not elsewhere classified: Secondary | ICD-10-CM | POA: Diagnosis not present

## 2017-10-12 DIAGNOSIS — Y939 Activity, unspecified: Secondary | ICD-10-CM | POA: Diagnosis not present

## 2017-10-12 DIAGNOSIS — F17200 Nicotine dependence, unspecified, uncomplicated: Secondary | ICD-10-CM | POA: Insufficient documentation

## 2017-10-12 DIAGNOSIS — Y999 Unspecified external cause status: Secondary | ICD-10-CM | POA: Insufficient documentation

## 2017-10-12 DIAGNOSIS — S300XXA Contusion of lower back and pelvis, initial encounter: Secondary | ICD-10-CM

## 2017-10-12 DIAGNOSIS — W010XXA Fall on same level from slipping, tripping and stumbling without subsequent striking against object, initial encounter: Secondary | ICD-10-CM | POA: Diagnosis not present

## 2017-10-12 DIAGNOSIS — Z79899 Other long term (current) drug therapy: Secondary | ICD-10-CM | POA: Insufficient documentation

## 2017-10-12 DIAGNOSIS — Y929 Unspecified place or not applicable: Secondary | ICD-10-CM | POA: Diagnosis not present

## 2017-10-12 DIAGNOSIS — S3992XA Unspecified injury of lower back, initial encounter: Secondary | ICD-10-CM | POA: Diagnosis present

## 2017-10-12 NOTE — ED Triage Notes (Signed)
She stepped into her car tonight and fell onto a concrete curb. Injury to her left buttocks. She took Ibuprofen 30 minutes ago.

## 2017-10-13 ENCOUNTER — Emergency Department (HOSPITAL_BASED_OUTPATIENT_CLINIC_OR_DEPARTMENT_OTHER): Payer: BLUE CROSS/BLUE SHIELD

## 2017-10-13 DIAGNOSIS — M533 Sacrococcygeal disorders, not elsewhere classified: Secondary | ICD-10-CM | POA: Diagnosis not present

## 2017-10-13 LAB — PREGNANCY, URINE: PREG TEST UR: NEGATIVE

## 2017-10-13 MED ORDER — KETOROLAC TROMETHAMINE 30 MG/ML IJ SOLN
30.0000 mg | Freq: Once | INTRAMUSCULAR | Status: AC
  Administered 2017-10-13: 30 mg via INTRAMUSCULAR
  Filled 2017-10-13: qty 1

## 2017-10-13 MED ORDER — IBUPROFEN 600 MG PO TABS: 600.0000 mg | ORAL_TABLET | Freq: Four times a day (QID) | ORAL | 0 refills | Status: DC | PRN

## 2017-10-13 NOTE — ED Provider Notes (Signed)
[facility] EMERGENCY DEPARTMENT Provider Note   CSN: [id number] Arrival date & time: 10/12/17  2148     History   Chief Complaint Chief Complaint  Patient presents with  . Fall    HPI Shyan Belmar is a 47 y.o. female.  HPI  This a 47 year old female who presents with buttock pain following a fall.  Patient reports that she was getting into her car when she missed stepped and fell onto the curb.  She is having left buttock pain.  She has been ambulatory.  Denies back pain.  Denies hitting her head or loss of consciousness.  Rates her pain at 10 out of 10.  She has not taken anything for pain.  Denies other injury.  Has weakness, numbness, tingling in the lower extremities.  Past Medical History:  Diagnosis Date  . Anxiety   . Bipolar disorder (HCC)   . Depression   . History of kidney stones   . Seizures (HCC) ~ 2009   "from taking Wellbutrin"  . Symptomatic bradycardia     Patient Active Problem List   Diagnosis Date Noted  . Fall   . Symptomatic bradycardia 07/29/2016  . Hypotension 07/29/2016  . Smoker 07/29/2016  . Bipolar disorder [facility])     Past Surgical History:  Procedure Laterality Date  . COLONOSCOPY  05/2001   Hattie Perch 06/04/2001  . ESOPHAGOGASTRODUODENOSCOPY  11/2002   Hattie Perch 12/05/2002  . EYE SURGERY    . LAPAROSCOPIC CHOLECYSTECTOMY  12/2002  . LITHOTRIPSY  X 1  . STRABISMUS SURGERY  1975; 1997   "both eyes; right eye"  . TONSILLECTOMY  1980    OB History    No data available       Home Medications    Prior to Admission medications   Medication Sig Start Date End Date Taking? Authorizing Provider  Cariprazine HCl 3 MG CAPS Take 1 capsule by mouth daily.   Yes [provider]  DULoxetine (CYMBALTA) 60 MG capsule Take 60 mg by mouth 2 (two) times daily.   Yes [provider]  Levothyroxine Sodium (SYNTHROID PO) Take by mouth.   Yes [provider]  LORazepam (ATIVAN) 1 MG tablet Take 1 mg by mouth  every 6 (six) hours as needed for anxiety.   Yes [provider]  methylphenidate (RITALIN) 20 MG tablet Take 20 mg by mouth daily.   Yes [provider]  Multiple Vitamin (MULTI-VITAMIN DAILY PO) Take by mouth.   Yes [provider]  OXcarbazepine (TRILEPTAL) 150 MG tablet Take 150 mg by mouth 2 (two) times daily.   Yes [provider]  ibuprofen (ADVIL,MOTRIN) 600 MG tablet Take 1 tablet (600 mg total) by mouth every 6 (six) hours as needed. 10/13/17   Horton, Mayer Masker, MD    Family History No family history on file.  Social History Social History   Tobacco Use  . Smoking status: Current Some Day Smoker    Years: 12.00  . Smokeless tobacco: Never Used  Substance Use Topics  . Alcohol use: Yes    Alcohol/week: 1.2 oz    Types: 1 Glasses of wine, 1 Shots of liquor per week  . Drug use: No     Allergies   Ciprofloxacin; Ambien [zolpidem tartrate]; and Wellbutrin [bupropion]   Review of Systems Review of Systems  Respiratory: Negative for shortness of breath.   Cardiovascular: Negative for chest pain.  Musculoskeletal: Negative for back pain.       Buttock pain  Skin:  Negative for wound.  Neurological: Negative for weakness and numbness.  All other systems reviewed and are negative.    Physical Exam Updated Vital Signs BP (!) 115/48   Pulse 74   Temp 98 F (36.7 C) (Oral)   Resp 16   Ht 5\' 5"  (1.651 m)   Wt 77.1 kg (170 lb)   LMP 09/28/2017 Comment: pregnancy test performed  SpO2 100%   BMI 28.29 kg/m   Physical Exam  Constitutional: She is oriented to person, place, and time. She appears well-developed and well-nourished. No distress.  HENT:  Head: Normocephalic and atraumatic.  Cardiovascular: Normal rate, regular rhythm and normal heart sounds.  Pulmonary/Chest: Effort normal and breath sounds normal. No respiratory distress. She has no wheezes.  Abdominal: Soft. Bowel sounds are normal. There is no tenderness.    Musculoskeletal:  Tenderness to palpation left buttock region just lateral to the sacrum and coccyx, no overlying skin changes, no obvious contusion, normal range of motion of the bilateral hips and knees  Neurological: She is alert and oriented to person, place, and time.  5 out of 5 strength in all 4 extremities  Skin: Skin is warm and dry.  Psychiatric: She has a normal mood and affect.  Nursing note and vitals reviewed.    ED Treatments / Results  Labs (all labs ordered are listed, but only abnormal results are displayed) Labs Reviewed  PREGNANCY, URINE    EKG  EKG Interpretation None       Radiology Dg Sacrum/coccyx  Result Date: 10/13/2017 CLINICAL DATA:  Fall.  Pain over sacrum/coccyx EXAM: SACRUM AND COCCYX - 2+ VIEW COMPARISON:  None. FINDINGS: There is no evidence of fracture or other focal bone lesions. IMPRESSION: Negative. Electronically Signed   By: Charlett NoseKevin  Dover M.D.   On: 10/13/2017 01:22    Procedures Procedures (including critical care time)  Medications Ordered in ED Medications  ketorolac (TORADOL) 30 MG/ML injection 30 mg (30 mg Intramuscular Given 10/13/17 0041)     Initial Impression / Assessment and Plan / ED Course  I have reviewed the triage vital signs and the nursing notes.  Pertinent labs & imaging results that were available during my care of the patient were reviewed by me and considered in my medical decision making (see chart for details).     Patient presents following a fall.  Left buttock pain.  No obvious signs of trauma on exam and neurologically intact.  X-rays of the coccyx are negative for fracture.  Patient much improved with Toradol.  Suspect deep contusion.  Recommend ice and anti-inflammatories.  After history, exam, and medical workup I feel the patient has been appropriately medically screened and is safe for discharge home. Pertinent diagnoses were discussed with the patient. Patient was given return  precautions.   Final Clinical Impressions(s) / ED Diagnoses   Final diagnoses:  Contusion of buttock, initial encounter    ED Discharge Orders        Ordered    ibuprofen (ADVIL,MOTRIN) 600 MG tablet  Every 6 hours PRN     10/13/17 0144       Shon BatonHorton, Courtney F, MD 10/13/17 [phone]

## 2017-10-13 NOTE — Discharge Instructions (Signed)
You were seen today for pain following a fall.  X-rays are negative for any fracture.  This likely represents a contusion or bruising.  Use ice and take anti-inflammatories like ibuprofen.

## 2017-10-16 DIAGNOSIS — W19XXXD Unspecified fall, subsequent encounter: Secondary | ICD-10-CM | POA: Diagnosis not present

## 2017-10-16 DIAGNOSIS — Z09 Encounter for follow-up examination after completed treatment for conditions other than malignant neoplasm: Secondary | ICD-10-CM | POA: Diagnosis not present

## 2017-10-16 DIAGNOSIS — S300XXD Contusion of lower back and pelvis, subsequent encounter: Secondary | ICD-10-CM | POA: Diagnosis not present

## 2017-10-28 DIAGNOSIS — Z0000 Encounter for general adult medical examination without abnormal findings: Secondary | ICD-10-CM | POA: Diagnosis not present

## 2017-11-13 ENCOUNTER — Emergency Department (HOSPITAL_BASED_OUTPATIENT_CLINIC_OR_DEPARTMENT_OTHER)
Admission: EM | Admit: 2017-11-13 | Discharge: 2017-11-14 | Disposition: A | Discharge status: Home / Self Care | Payer: BLUE CROSS/BLUE SHIELD | Attending: Emergency Medicine | Admitting: Emergency Medicine

## 2017-11-13 ENCOUNTER — Other Ambulatory Visit: Payer: Self-pay

## 2017-11-13 ENCOUNTER — Encounter (HOSPITAL_BASED_OUTPATIENT_CLINIC_OR_DEPARTMENT_OTHER): Payer: Self-pay

## 2017-11-13 DIAGNOSIS — R112 Nausea with vomiting, unspecified: Secondary | ICD-10-CM | POA: Diagnosis not present

## 2017-11-13 DIAGNOSIS — Z87891 Personal history of nicotine dependence: Secondary | ICD-10-CM | POA: Diagnosis not present

## 2017-11-13 DIAGNOSIS — R197 Diarrhea, unspecified: Secondary | ICD-10-CM | POA: Diagnosis not present

## 2017-11-13 DIAGNOSIS — E876 Hypokalemia: Secondary | ICD-10-CM | POA: Diagnosis not present

## 2017-11-13 DIAGNOSIS — A084 Viral intestinal infection, unspecified: Secondary | ICD-10-CM

## 2017-11-13 DIAGNOSIS — Z87442 Personal history of urinary calculi: Secondary | ICD-10-CM | POA: Diagnosis not present

## 2017-11-13 DIAGNOSIS — K529 Noninfective gastroenteritis and colitis, unspecified: Secondary | ICD-10-CM | POA: Insufficient documentation

## 2017-11-13 DIAGNOSIS — Z79899 Other long term (current) drug therapy: Secondary | ICD-10-CM | POA: Diagnosis not present

## 2017-11-13 LAB — COMPREHENSIVE METABOLIC PANEL
ALT: 33 U/L (ref 14–54)
AST: 33 U/L (ref 15–41)
Albumin: 3.8 g/dL (ref 3.5–5.0)
Alkaline Phosphatase: 81 U/L (ref 38–126)
Anion gap: 8 (ref 5–15)
CHLORIDE: 105 mmol/L (ref 101–111)
CO2: 25 mmol/L (ref 22–32)
CREATININE: 0.65 mg/dL (ref 0.44–1.00)
Calcium: 8.5 mg/dL — ABNORMAL LOW (ref 8.9–10.3)
GFR calc Af Amer: >60 mL/min (ref >60)
GLUCOSE: 95 mg/dL (ref 65–99)
Potassium: 2.9 mmol/L — ABNORMAL LOW (ref 3.5–5.1)
Sodium: 138 mmol/L (ref 135–145)
Total Bilirubin: 0.2 mg/dL — ABNORMAL LOW (ref 0.3–1.2)
Total Protein: 6.5 g/dL (ref 6.5–8.1)

## 2017-11-13 LAB — URINALYSIS, ROUTINE W REFLEX MICROSCOPIC
Bilirubin Urine: NEGATIVE
GLUCOSE, UA: NEGATIVE mg/dL
KETONES UR: 15 mg/dL — AB
Leukocytes, UA: NEGATIVE
Nitrite: NEGATIVE
PROTEIN: NEGATIVE mg/dL
Specific Gravity, Urine: 1.02 (ref 1.005–1.030)
pH: 6 (ref 5.0–8.0)

## 2017-11-13 LAB — CBC
HCT: 37 % (ref 36.0–46.0)
Hemoglobin: 12.6 g/dL (ref 12.0–15.0)
MCH: 30.7 pg (ref 26.0–34.0)
MCHC: 34.1 g/dL (ref 30.0–36.0)
MCV: 90.2 fL (ref 78.0–100.0)
PLATELETS: 265 10*3/uL (ref 150–400)
RBC: 4.1 MIL/uL (ref 3.87–5.11)
RDW: 12.5 % (ref 11.5–15.5)
WBC: 6.4 10*3/uL (ref 4.0–10.5)

## 2017-11-13 LAB — URINALYSIS, MICROSCOPIC (REFLEX): WBC, UA: NONE SEEN WBC/hpf (ref 0–5)

## 2017-11-13 LAB — PREGNANCY, URINE: Preg Test, Ur: NEGATIVE

## 2017-11-13 LAB — LIPASE, BLOOD: LIPASE: 26 U/L (ref 11–51)

## 2017-11-13 MED ORDER — ONDANSETRON HCL 4 MG/2ML IJ SOLN
4.0000 mg | Freq: Once | INTRAMUSCULAR | Status: AC
Start: 2017-11-13 — End: 2017-11-13
  Administered 2017-11-13: 4 mg via INTRAVENOUS
  Filled 2017-11-13: qty 2

## 2017-11-13 MED ORDER — PANTOPRAZOLE SODIUM 40 MG IV SOLR
40.0000 mg | Freq: Once | INTRAVENOUS | Status: AC
  Administered 2017-11-13: 40 mg via INTRAVENOUS
  Filled 2017-11-13: qty 40

## 2017-11-13 MED ORDER — POTASSIUM CHLORIDE CRYS ER 20 MEQ PO TBCR
40.0000 meq | EXTENDED_RELEASE_TABLET | Freq: Once | ORAL | Status: AC
  Administered 2017-11-14: 40 meq via ORAL
  Filled 2017-11-13: qty 2

## 2017-11-13 MED ORDER — SODIUM CHLORIDE 0.9 % IV BOLUS
1000.0000 mL | Freq: Once | INTRAVENOUS | Status: AC
  Administered 2017-11-13: 1000 mL via INTRAVENOUS

## 2017-11-13 NOTE — ED Notes (Signed)
Alert, NAD, calm, interactive, resps e/u, speaking in clear complete sentences, no dyspnea noted, skin W&D, VSS, c/o NVD and abd pain, sx onset Tuesday, Vx4 in last 24 hr, Dx3 in last 24 hr after 3 doses of immodium, "concerned about not tolerating thyroid and bipolar meds (depakote), last BM this am (watery), (denies: fever, bleeding, sob, dizziness or visual changes).

## 2017-11-13 NOTE — ED Triage Notes (Signed)
C/o abd pain, n/v/d x 4 days-NAD-steady gait 

## 2017-11-13 NOTE — ED Provider Notes (Signed)
[facility] Provider Note: Lowella Dell, MD, FACEP  CSN: [id number] MRN: [id number] ARRIVAL: 11/13/17 at 2016 ROOM: [facility]01/[facility]01   CHIEF COMPLAINT  N/V/D   HISTORY OF PRESENT ILLNESS  11/13/17 10:59 PM [name] is a 47 y.o. female with a 3-day history of nausea, vomiting and diarrhea.  She describes the vomiting as forceful but without blood.  She describes the diarrhea as watery and without blood.  She does not have constant abdominal pain but when she eats or drinks she feels like her stomach is bloated "really bad" and this causes her to vomit.  She denies fever, lightheadedness, chest pain, shortness of breath or weakness.  She is concerned that she has not been able to keep down her regular medications for the last several days.   Past Medical History:  Diagnosis Date  . Anxiety   . Bipolar disorder (HCC)   . Depression   . History of kidney stones   . Seizures (HCC) ~ 2009   "from taking Wellbutrin"  . Symptomatic bradycardia     Past Surgical History:  Procedure Laterality Date  . COLONOSCOPY  05/2001   Hattie Perch 06/04/2001  . ESOPHAGOGASTRODUODENOSCOPY  11/2002   Hattie Perch 12/05/2002  . EYE SURGERY    . LAPAROSCOPIC CHOLECYSTECTOMY  12/2002  . LITHOTRIPSY  X 1  . STRABISMUS SURGERY  1975; 1997   "both eyes; right eye"  . TONSILLECTOMY  1980    No family history on file.  Social History   Tobacco Use  . Smoking status: Former Smoker    Last attempt to quit: 10/15/2017    Years since quitting: 0.0  . Smokeless tobacco: Never Used  Substance Use Topics  . Alcohol use: Yes  . Drug use: No    Prior to Admission medications   Medication Sig Start Date End Date Taking? Authorizing Provider  Cariprazine HCl 3 MG CAPS Take 1 capsule by mouth daily.    [provider]  DULoxetine (CYMBALTA) 60 MG capsule Take 60 mg by mouth 2 (two) times daily.    [provider]  ibuprofen (ADVIL,MOTRIN) 600 MG tablet Take 1 tablet (600 mg total)  by mouth every 6 (six) hours as needed. 10/13/17   Horton, Mayer Masker, MD  Levothyroxine Sodium (SYNTHROID PO) Take by mouth.    [provider]  LORazepam (ATIVAN) 1 MG tablet Take 1 mg by mouth every 6 (six) hours as needed for anxiety.    [provider]  methylphenidate (RITALIN) 20 MG tablet Take 20 mg by mouth daily.    [provider]  Multiple Vitamin (MULTI-VITAMIN DAILY PO) Take by mouth.    [provider]  OXcarbazepine (TRILEPTAL) 150 MG tablet Take 150 mg by mouth 2 (two) times daily.    [provider]    Allergies Ciprofloxacin; Ambien [zolpidem tartrate]; and Wellbutrin [bupropion]   REVIEW OF SYSTEMS  Negative except as noted here or in the History of Present Illness.   PHYSICAL EXAMINATION  Initial Vital Signs Blood pressure 118/60, pulse 86, temperature 98.2 F (36.8 C), temperature source Oral, resp. rate 18, height 5\' 5"  (1.651 m), weight 80.5 kg (177 lb 7.5 oz), last menstrual period 10/15/2017, SpO2 98 %.  Examination General: Well-developed, well-nourished female in no acute distress; appearance consistent with age of record HENT: normocephalic; atraumatic Eyes: pupils equal, round and reactive to light; extraocular muscles intact Neck: supple Heart: regular rate and rhythm Lungs: clear to auscultation bilaterally Abdomen: soft; nondistended; nontender; no masses or  hepatosplenomegaly; bowel sounds present Extremities: No deformity; full range of motion; pulses normal Neurologic: Awake, alert and oriented; motor function intact in all extremities and symmetric; no facial droop Skin: Warm and dry Psychiatric: Normal mood and affect   RESULTS  Summary of this visit's results, reviewed by myself:   EKG Interpretation  Date/Time:    Ventricular Rate:    PR Interval:    QRS Duration:   QT Interval:    QTC Calculation:   R Axis:     Text Interpretation:        Laboratory Studies: Results for orders  placed or performed during the hospital encounter of 11/13/17 (from the past 24 hour(s))  Lipase, blood     Status: None   Collection Time: 11/13/17  9:44 PM  Result Value Ref Range   Lipase 26 11 - 51 U/L  Comprehensive metabolic panel     Status: Abnormal   Collection Time: 11/13/17  9:44 PM  Result Value Ref Range   Sodium 138 135 - 145 mmol/L   Potassium 2.9 (L) 3.5 - 5.1 mmol/L   Chloride 105 101 - 111 mmol/L   CO2 25 22 - 32 mmol/L   Glucose, Bld 95 65 - 99 mg/dL   BUN <5 (L) 6 - 20 mg/dL   Creatinine, Ser 1.61 0.44 - 1.00 mg/dL   Calcium 8.5 (L) 8.9 - 10.3 mg/dL   Total Protein 6.5 6.5 - 8.1 g/dL   Albumin 3.8 3.5 - 5.0 g/dL   AST 33 15 - 41 U/L   ALT 33 14 - 54 U/L   Alkaline Phosphatase 81 38 - 126 U/L   Total Bilirubin 0.2 (L) 0.3 - 1.2 mg/dL   GFR calc non Af Amer >60 >60 mL/min   GFR calc Af Amer >60 >60 mL/min   Anion gap 8 5 - 15  CBC     Status: None   Collection Time: 11/13/17  9:44 PM  Result Value Ref Range   WBC 6.4 4.0 - 10.5 K/uL   RBC 4.10 3.87 - 5.11 MIL/uL   Hemoglobin 12.6 12.0 - 15.0 g/dL   HCT 09.6 04.5 - 40.9 %   MCV 90.2 78.0 - 100.0 fL   MCH 30.7 26.0 - 34.0 pg   MCHC 34.1 30.0 - 36.0 g/dL   RDW 81.1 91.4 - 78.2 %   Platelets 265 150 - 400 K/uL  Urinalysis, Routine w reflex microscopic     Status: Abnormal   Collection Time: 11/13/17  9:54 PM  Result Value Ref Range   Color, Urine YELLOW YELLOW   APPearance CLEAR CLEAR   Specific Gravity, Urine 1.020 1.005 - 1.030   pH 6.0 5.0 - 8.0   Glucose, UA NEGATIVE NEGATIVE mg/dL   Hgb urine dipstick TRACE (A) NEGATIVE   Bilirubin Urine NEGATIVE NEGATIVE   Ketones, ur 15 (A) NEGATIVE mg/dL   Protein, ur NEGATIVE NEGATIVE mg/dL   Nitrite NEGATIVE NEGATIVE   Leukocytes, UA NEGATIVE NEGATIVE  Pregnancy, urine     Status: None   Collection Time: 11/13/17  9:54 PM  Result Value Ref Range   Preg Test, Ur NEGATIVE NEGATIVE  Urinalysis, Microscopic (reflex)     Status: Abnormal   Collection Time:  11/13/17  9:54 PM  Result Value Ref Range   RBC / HPF 0-5 0 - 5 RBC/hpf   WBC, UA NONE SEEN 0 - 5 WBC/hpf   Bacteria, UA RARE (A) NONE SEEN   Squamous Epithelial / LPF 0-5 (A) NONE  SEEN   Imaging Studies: No results found.  ED COURSE  Nursing notes and initial vitals signs, including pulse oximetry, reviewed.  Vitals:   11/14/17 0000 11/14/17 0030 11/14/17 0045 11/14/17 0100  BP: (!) 96/55 (!) 100/48 (!) 101/58 (!) 97/49  Pulse: 64 65 69 66  Resp:      Temp:      TempSrc:      SpO2: 99% 99% 100% 97%  Weight:      Height:       1:23 AM Patient drinking fluids without emesis after IV fluid bolus and Zofran.  We will replete her potassium.  PROCEDURES    ED DIAGNOSES     ICD-10-CM   1. Viral gastroenteritis A08.4   2. Hypokalemia due to loss of potassium E87.6        Devansh Riese, MD 11/14/17 [id number]

## 2017-11-14 MED ORDER — ONDANSETRON 8 MG PO TBDP: 8.0000 mg | ORAL_TABLET | Freq: Three times a day (TID) | ORAL | 0 refills | Status: DC | PRN

## 2017-11-14 MED ORDER — POTASSIUM CHLORIDE CRYS ER 20 MEQ PO TBCR: 20.0000 meq | EXTENDED_RELEASE_TABLET | Freq: Two times a day (BID) | ORAL | 0 refills | Status: DC

## 2017-12-24 DIAGNOSIS — F413 Other mixed anxiety disorders: Secondary | ICD-10-CM | POA: Diagnosis not present

## 2018-01-20 DIAGNOSIS — F3177 Bipolar disorder, in partial remission, most recent episode mixed: Secondary | ICD-10-CM | POA: Diagnosis not present

## 2018-01-20 DIAGNOSIS — G4726 Circadian rhythm sleep disorder, shift work type: Secondary | ICD-10-CM | POA: Diagnosis not present

## 2018-05-10 DIAGNOSIS — F900 Attention-deficit hyperactivity disorder, predominantly inattentive type: Secondary | ICD-10-CM | POA: Diagnosis not present

## 2018-05-10 DIAGNOSIS — F3178 Bipolar disorder, in full remission, most recent episode mixed: Secondary | ICD-10-CM | POA: Diagnosis not present

## 2018-07-07 DIAGNOSIS — G4726 Circadian rhythm sleep disorder, shift work type: Secondary | ICD-10-CM | POA: Diagnosis not present

## 2018-07-07 DIAGNOSIS — F3112 Bipolar disorder, current episode manic without psychotic features, moderate: Secondary | ICD-10-CM | POA: Diagnosis not present

## 2018-07-07 DIAGNOSIS — F900 Attention-deficit hyperactivity disorder, predominantly inattentive type: Secondary | ICD-10-CM | POA: Diagnosis not present

## 2018-08-20 DIAGNOSIS — Z79899 Other long term (current) drug therapy: Secondary | ICD-10-CM | POA: Diagnosis not present

## 2018-08-26 DIAGNOSIS — Z6830 Body mass index (BMI) 30.0-30.9, adult: Secondary | ICD-10-CM | POA: Diagnosis not present

## 2018-08-26 DIAGNOSIS — H6980 Other specified disorders of Eustachian tube, unspecified ear: Secondary | ICD-10-CM | POA: Diagnosis not present

## 2018-08-26 DIAGNOSIS — J00 Acute nasopharyngitis [common cold]: Secondary | ICD-10-CM | POA: Diagnosis not present

## 2018-08-31 DIAGNOSIS — F900 Attention-deficit hyperactivity disorder, predominantly inattentive type: Secondary | ICD-10-CM | POA: Diagnosis not present

## 2018-08-31 DIAGNOSIS — F3162 Bipolar disorder, current episode mixed, moderate: Secondary | ICD-10-CM | POA: Diagnosis not present

## 2018-08-31 DIAGNOSIS — G4726 Circadian rhythm sleep disorder, shift work type: Secondary | ICD-10-CM | POA: Diagnosis not present

## 2018-09-06 ENCOUNTER — Ambulatory Visit: Payer: BLUE CROSS/BLUE SHIELD | Admitting: Family Medicine

## 2018-09-06 DIAGNOSIS — Z0289 Encounter for other administrative examinations: Secondary | ICD-10-CM

## 2018-09-20 DIAGNOSIS — H04123 Dry eye syndrome of bilateral lacrimal glands: Secondary | ICD-10-CM | POA: Diagnosis not present

## 2018-09-20 DIAGNOSIS — H5010 Unspecified exotropia: Secondary | ICD-10-CM | POA: Diagnosis not present

## 2018-09-20 DIAGNOSIS — H5500 Unspecified nystagmus: Secondary | ICD-10-CM | POA: Diagnosis not present

## 2018-09-20 DIAGNOSIS — H52203 Unspecified astigmatism, bilateral: Secondary | ICD-10-CM | POA: Diagnosis not present

## 2018-10-20 DIAGNOSIS — Z01419 Encounter for gynecological examination (general) (routine) without abnormal findings: Secondary | ICD-10-CM | POA: Diagnosis not present

## 2018-10-20 DIAGNOSIS — Z1231 Encounter for screening mammogram for malignant neoplasm of breast: Secondary | ICD-10-CM | POA: Diagnosis not present

## 2018-10-20 DIAGNOSIS — Z6832 Body mass index (BMI) 32.0-32.9, adult: Secondary | ICD-10-CM | POA: Diagnosis not present

## 2018-10-27 DIAGNOSIS — Z6833 Body mass index (BMI) 33.0-33.9, adult: Secondary | ICD-10-CM | POA: Diagnosis not present

## 2018-10-27 DIAGNOSIS — J00 Acute nasopharyngitis [common cold]: Secondary | ICD-10-CM | POA: Diagnosis not present

## 2018-11-12 ENCOUNTER — Ambulatory Visit: Payer: BLUE CROSS/BLUE SHIELD | Admitting: Family Medicine

## 2018-12-14 ENCOUNTER — Other Ambulatory Visit: Payer: Self-pay

## 2018-12-14 ENCOUNTER — Encounter: Payer: Self-pay | Admitting: Family Medicine

## 2018-12-14 ENCOUNTER — Ambulatory Visit (INDEPENDENT_AMBULATORY_CARE_PROVIDER_SITE_OTHER): Payer: BLUE CROSS/BLUE SHIELD | Admitting: Family Medicine

## 2018-12-14 DIAGNOSIS — E039 Hypothyroidism, unspecified: Secondary | ICD-10-CM | POA: Diagnosis not present

## 2018-12-14 DIAGNOSIS — F909 Attention-deficit hyperactivity disorder, unspecified type: Secondary | ICD-10-CM | POA: Insufficient documentation

## 2018-12-14 DIAGNOSIS — G8929 Other chronic pain: Secondary | ICD-10-CM

## 2018-12-14 DIAGNOSIS — M545 Low back pain, unspecified: Secondary | ICD-10-CM | POA: Insufficient documentation

## 2018-12-14 DIAGNOSIS — F3341 Major depressive disorder, recurrent, in partial remission: Secondary | ICD-10-CM | POA: Diagnosis not present

## 2018-12-14 MED ORDER — METHOCARBAMOL 500 MG PO TABS: 500.0000 mg | ORAL_TABLET | Freq: Two times a day (BID) | ORAL | 1 refills | Status: DC | PRN

## 2018-12-14 NOTE — Progress Notes (Signed)
Virtual Visit via Video Note   I connected with [name] on 12/14/18 at  9:00 AM EDT by a video enabled telemedicine application and verified that I am speaking with the correct person using two identifiers.  Location patient: home Location provider:home office Persons participating in the virtual visit: patient, provider  I discussed the limitations of evaluation and management by telemedicine and the availability of in person appointments. She expressed understanding and agreed to proceed.   HPI: Mary Evetts is a 47 yo female with Hx og bipolar disorder,depression,anxiety,and ADHD among some. Former PCP: Dr Dimple Casey. She follows with gyn annually. CPE 10/2017.  She lives with her wife.  ADHD and bipolar disorder,she follows with psychiatrist q 3 months. Recently started on Jorney 80 mg to take at bedtime.  She is on Ativan,Cariprazine,and Cymbalta.  Former smoker,from 2005 to 2018.  Depression, deliberate med overdose in 2016. Negative for suicidal thoughts.   Hypothyroidism, she is on Levothyroxine 25 mg,follows with gyn. TSH 1.22 in 10/2017. She has not noted unusual fatigue,palpitations,cold/heat intolerance,or changes kin bowel habits.  Today she is c/o right-sided lower back pain,intermittent for a few years. She wonders if back pain is caused by kidney stone. Pain worse after a fall in 2018. Sometimes radiated to RLE. Negative for LE numbness,saddle anesthesia,or urine/bowel incontinence. Exacerbated by certain movements and alleviated by rest. Sharp,8/10,about 2 times per week.  She takes Motrin prn.  No associated nausea,vomiting,abdominal pain,or gross hematuria. She has not noted fever,chills,or skin rash on affected area.  Abdominal CT in 07/2016: 2 mm left mid kidneynonobstructive calculus.   ROS: See pertinent positives and negatives per HPI. COVID-19 screening questions: Denies new fever,cough,sore throat,or possible exposure to COVID-19. Negative for  loss in the sense of smell or taste.   Past Medical History:  Diagnosis Date  . Anxiety   . Bipolar disorder (HCC)   . Depression   . History of kidney stones   . Seizures (HCC) ~ 2009   "from taking Wellbutrin"  . Symptomatic bradycardia     Past Surgical History:  Procedure Laterality Date  . COLONOSCOPY  05/2001   Hattie Perch 06/04/2001  . ESOPHAGOGASTRODUODENOSCOPY  11/2002   Hattie Perch 12/05/2002  . EYE SURGERY    . LAPAROSCOPIC CHOLECYSTECTOMY  12/2002  . LITHOTRIPSY  X 1  . STRABISMUS SURGERY  1975; 1997   "both eyes; right eye"  . TONSILLECTOMY  1980    History reviewed. No pertinent family history.  Social History   Socioeconomic History  . Marital status: Divorced    Spouse name: Not on file  . Number of children: Not on file  . Years of education: Not on file  . Highest education level: Not on file  Occupational History  . Not on file  Social Needs  . Financial resource strain: Not on file  . Food insecurity:    Worry: Not on file    Inability: Not on file  . Transportation needs:    Medical: Not on file    Non-medical: Not on file  Tobacco Use  . Smoking status: Former Smoker    Last attempt to quit: 10/15/2017    Years since quitting: 1.1  . Smokeless tobacco: Never Used  Substance and Sexual Activity  . Alcohol use: Yes  . Drug use: No  . Sexual activity: Yes    Birth control/protection: None  Lifestyle  . Physical activity:    Days per week: Not on file    Minutes per session: Not on file  .  Stress: Not on file  Relationships  . Social connections:    Talks on phone: Not on file    Gets together: Not on file    Attends religious service: Not on file    Active member of club or organization: Not on file    Attends meetings of clubs or organizations: Not on file    Relationship status: Not on file  . Intimate partner violence:    Fear of current or ex partner: Not on file    Emotionally abused: Not on file    Physically abused: Not on file     Forced sexual activity: Not on file  Other Topics Concern  . Not on file  Social History Narrative  . Not on file      Current Outpatient Medications:  .  Cariprazine HCl 6 MG CAPS, Take 6 mg by mouth daily., Disp: , Rfl:  .  DULoxetine (CYMBALTA) 60 MG capsule, Take 60 mg by mouth 2 (two) times daily., Disp: , Rfl:  .  ibuprofen (ADVIL,MOTRIN) 600 MG tablet, Take 1 tablet (600 mg total) by mouth every 6 (six) hours as needed., Disp: 30 tablet, Rfl: 0 .  Levothyroxine Sodium (SYNTHROID PO), Take by mouth., Disp: , Rfl:  .  LORazepam (ATIVAN) 1 MG tablet, Take 1 mg by mouth every 6 (six) hours as needed for anxiety., Disp: , Rfl:  .  methocarbamol (ROBAXIN) 500 MG tablet, Take 1 tablet (500 mg total) by mouth 2 (two) times daily as needed for muscle spasms., Disp: 30 tablet, Rfl: 1 .  Multiple Vitamin (MULTI-VITAMIN DAILY PO), Take by mouth., Disp: , Rfl:   EXAM:  VITALS per patient if applicable:Resp 12   GENERAL: alert, oriented, appears well and in no acute distress  HEENT: atraumatic, conjunttiva clear, normocephalic,no obvious facial abnormalities on inspection.  NECK: normal movements of the head and neck  LUNGS: on inspection no signs of respiratory distress, breathing rate appears normal, no obvious gross SOB, gasping or wheezing  CV: no obvious cyanosis  Mary: moves all visible extremities without noticeable abnormality  PSYCH/NEURO: pleasant and cooperative, no obvious depression or anxiety, speech and thought processing grossly intact  ASSESSMENT AND PLAN:  Discussed the following assessment and plan:  Chronic lower back pain I do not think it is related to kidney stone. Musculoskeletal most likely. Educated about Dx and prognosis. I do not think imaging is needed today but needs to be consider if pain is worse or new associated symptom presents. Methocarbamol 500 mg daily at bid as needed recommended and OTC icy hot patch prn.    ADHD Well  controlled. Continue following with psychiatrist.  Hypothyroidism (acquired) Problem well controlled. No changes in Levothyroxine dose. Continue following with gyn.  Depression, major, recurrent, in partial remission (HCC) Reporting problem as stable. Continue following with psychiatrist.     I discussed the assessment and treatment plan with the patient. She was provided an opportunity to ask questions and all were answered. The patient agreed with the plan and demonstrated an understanding of the instructions.    Return in about 1 year (around 12/14/2019) for cpe.    Herta Hink [place], MD

## 2018-12-14 NOTE — Assessment & Plan Note (Addendum)
I do not think it is related to kidney stone. Musculoskeletal most likely. Educated about Dx and prognosis. I do not think imaging is needed today but needs to be consider if pain is worse or new associated symptom presents. Methocarbamol 500 mg daily at bid as needed recommended and OTC icy hot patch prn.

## 2018-12-14 NOTE — Assessment & Plan Note (Signed)
Well controlled. Continue following with psychiatrist.

## 2018-12-14 NOTE — Assessment & Plan Note (Signed)
Reporting problem as stable. Continue following with psychiatrist.

## 2018-12-14 NOTE — Assessment & Plan Note (Signed)
Problem well controlled. No changes in Levothyroxine dose. Continue following with gyn.

## 2019-01-04 DIAGNOSIS — F3178 Bipolar disorder, in full remission, most recent episode mixed: Secondary | ICD-10-CM | POA: Diagnosis not present

## 2019-01-04 DIAGNOSIS — F900 Attention-deficit hyperactivity disorder, predominantly inattentive type: Secondary | ICD-10-CM | POA: Diagnosis not present

## 2019-03-31 IMAGING — US ULTRASOUND RIGHT BREAST LIMITED
1 series · 7 of 7 positions shown · non-contrast
Comparison: Previous exam(s).

CLINICAL DATA: 46-year-old female presenting for evaluation of a
palpable mass in the right breast.

EXAM:
2D DIGITAL DIAGNOSTIC BILATERAL MAMMOGRAM WITH CAD AND ADJUNCT TOMO
RIGHT BREAST ULTRASOUND

[Series 1: ultrasound right breast limited · 0.06mm/px · 7 of 7 slices shown]
[im 1/7]
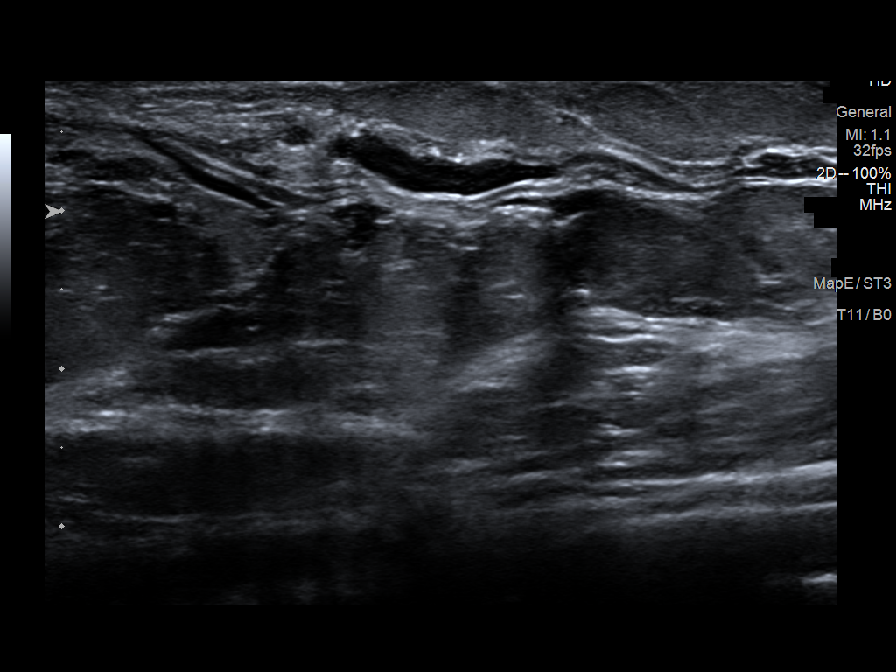
[im 2/7]
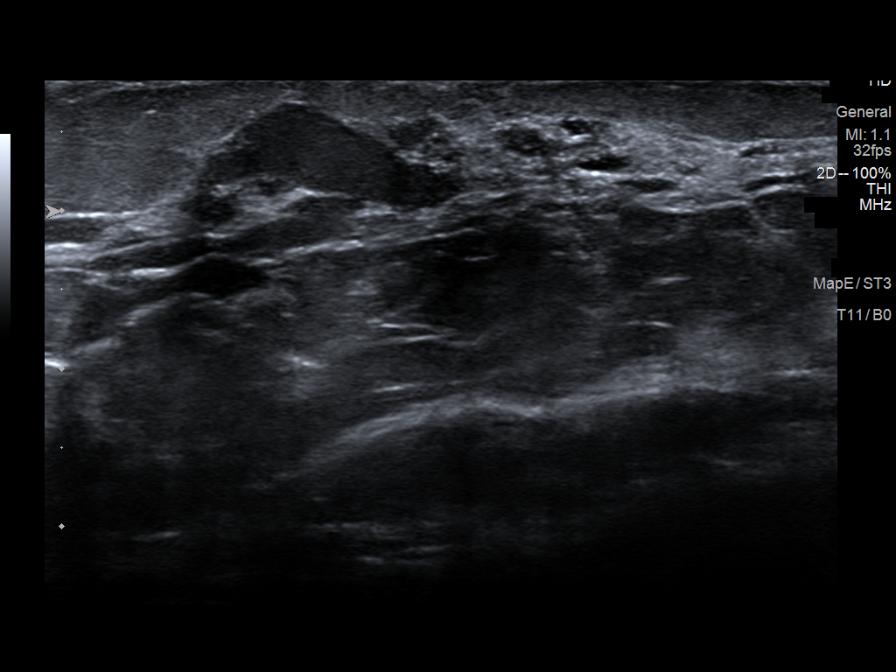
[im 3/7]
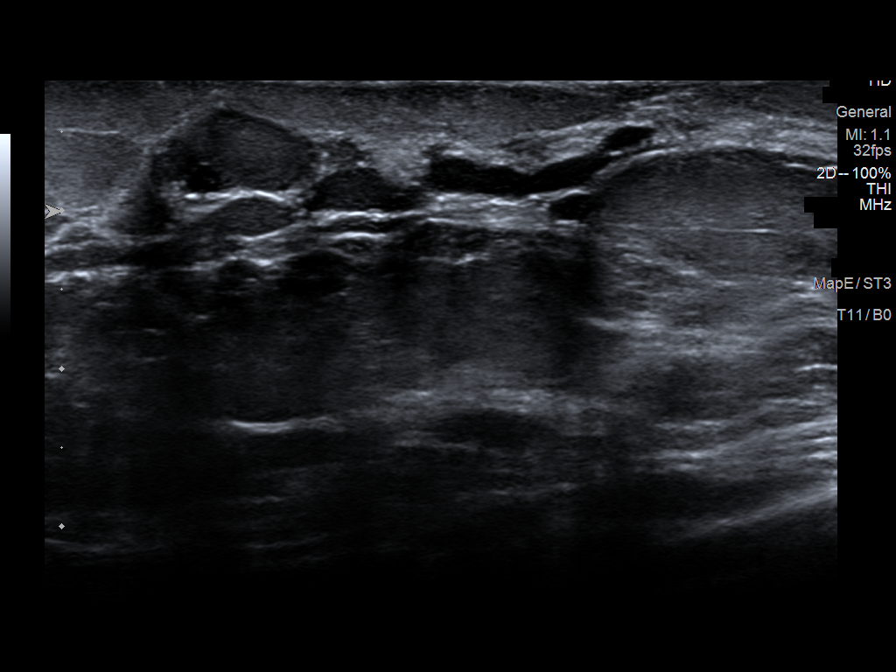
[im 4/7]
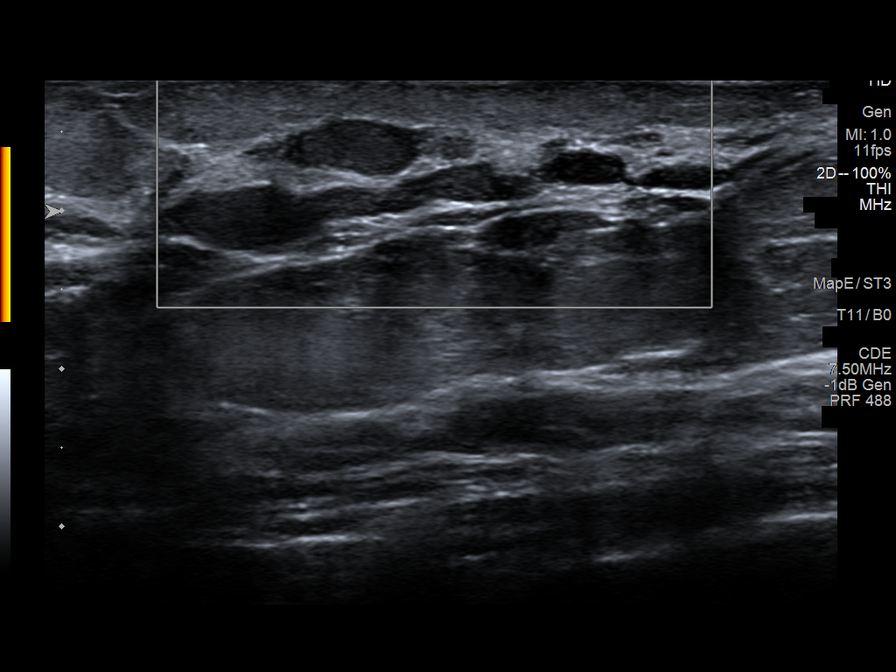
[im 5/7]
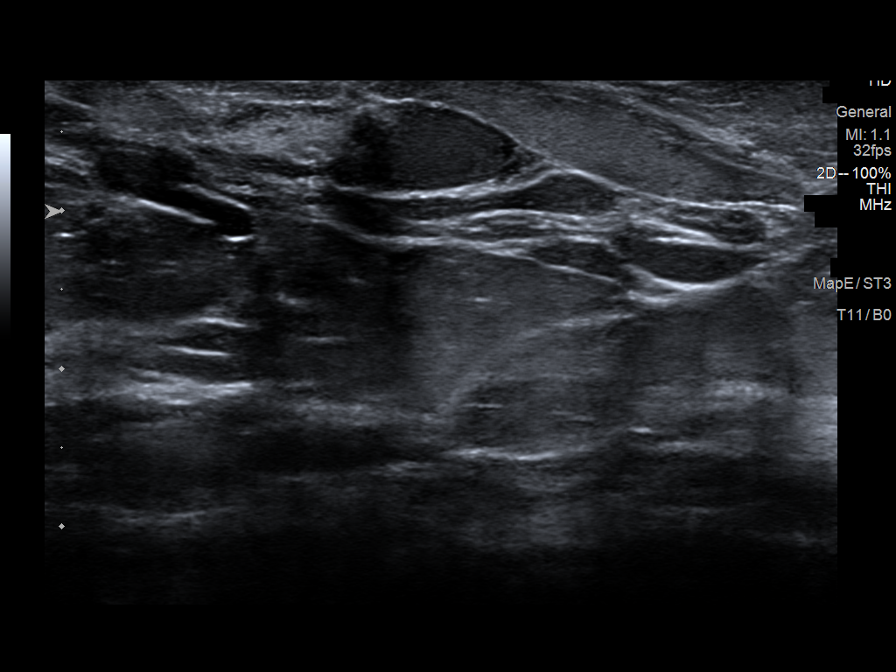
[im 6/7]
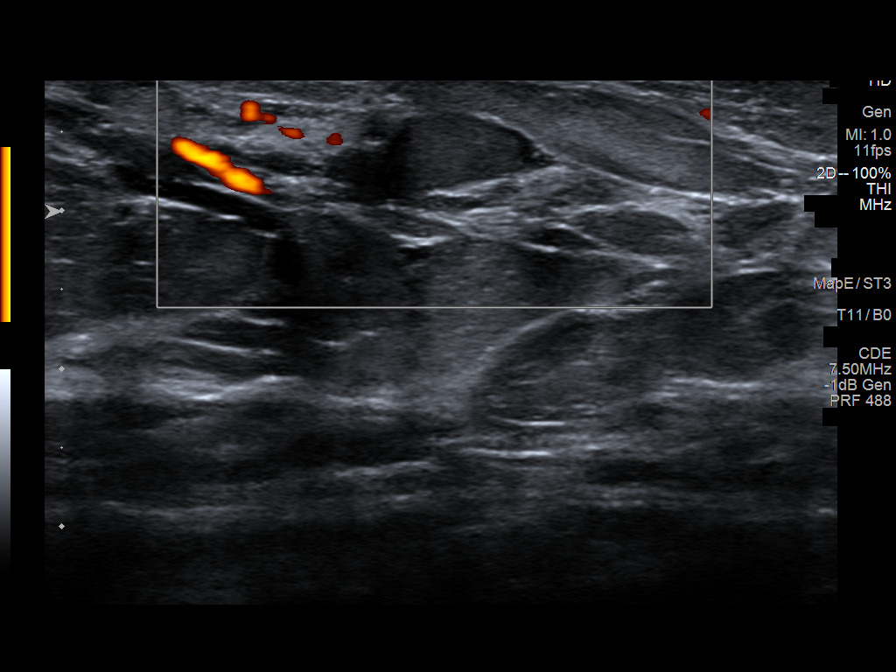
[im 7/7]
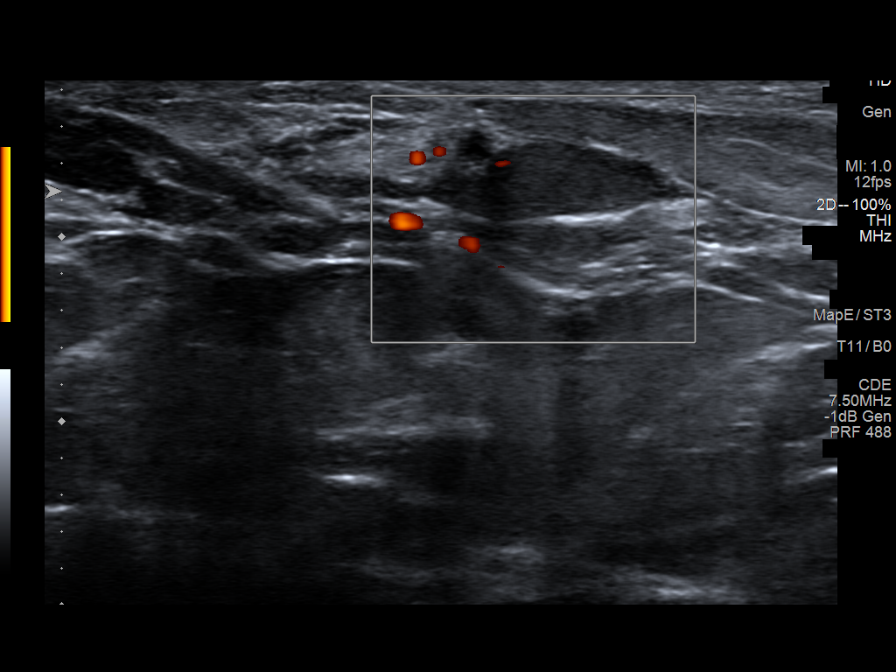

[7 of 7 positions shown; findings below may reference images not displayed]

ACR Breast Density Category c: The breast tissue is heterogeneously
dense, which may obscure small masses.
FINDINGS: A BB has been placed on the inferior aspect of the right breast
indicating the palpable site of concern. There is a possible
obscured mass at this site measuring approximately 1.2 cm. No other
suspicious calcifications, masses or areas of distortion are seen in
the bilateral breasts.

Mammographic images were processed with CAD.

Lumpy tissue is palpated inferior to the right nipple without
discrete palpable mass.

Ultrasound targeted to the palpable site inferior to the right
nipple at approximately 6 o'clock demonstrates multiple dilated
ducts with mobile internal debris. There is 1 expanded duct at 6
o'clock with a small amount of peripheral blood flow. This mass
measures 1.3 x 0.4 x 0.5 cm.
IMPRESSION: 1. There is a dilated duct with possible mass versus complicated
fluid at the palpable site at 6 o'clock in the right breast.

2.  No mammographic evidence of left breast malignancy.

RECOMMENDATION:
Ultrasound-guided biopsy is recommended for the right breast mass.
This has been scheduled today at [DATE] p.m..

I have discussed the findings and recommendations with the patient.
Results were also provided in writing at the conclusion of the
visit. If applicable, a reminder letter will be sent to the patient
regarding the next appointment.

BI-RADS CATEGORY  4: Suspicious.

## 2019-04-12 ENCOUNTER — Other Ambulatory Visit: Payer: Self-pay

## 2019-04-12 DIAGNOSIS — Z20822 Contact with and (suspected) exposure to covid-19: Secondary | ICD-10-CM

## 2019-04-12 DIAGNOSIS — R6889 Other general symptoms and signs: Secondary | ICD-10-CM | POA: Diagnosis not present

## 2019-04-13 LAB — NOVEL CORONAVIRUS, NAA: SARS-CoV-2, NAA: NOT DETECTED

## 2019-05-14 ENCOUNTER — Ambulatory Visit: Payer: BC Managed Care – PPO

## 2019-06-21 DIAGNOSIS — G4726 Circadian rhythm sleep disorder, shift work type: Secondary | ICD-10-CM | POA: Diagnosis not present

## 2019-06-21 DIAGNOSIS — F3178 Bipolar disorder, in full remission, most recent episode mixed: Secondary | ICD-10-CM | POA: Diagnosis not present

## 2019-06-21 DIAGNOSIS — F900 Attention-deficit hyperactivity disorder, predominantly inattentive type: Secondary | ICD-10-CM | POA: Diagnosis not present

## 2019-07-01 ENCOUNTER — Telehealth (INDEPENDENT_AMBULATORY_CARE_PROVIDER_SITE_OTHER): Payer: BC Managed Care – PPO | Admitting: Family Medicine

## 2019-07-01 ENCOUNTER — Encounter: Payer: Self-pay | Admitting: Family Medicine

## 2019-07-01 ENCOUNTER — Other Ambulatory Visit: Payer: Self-pay

## 2019-07-01 DIAGNOSIS — S61238A Puncture wound without foreign body of other finger without damage to nail, initial encounter: Secondary | ICD-10-CM | POA: Diagnosis not present

## 2019-07-01 DIAGNOSIS — W540XXA Bitten by dog, initial encounter: Secondary | ICD-10-CM | POA: Diagnosis not present

## 2019-07-01 MED ORDER — AMOXICILLIN-POT CLAVULANATE 875-125 MG PO TABS
1.0000 | ORAL_TABLET | Freq: Two times a day (BID) | ORAL | 0 refills | Status: AC
Start: 2019-07-01 — End: 2019-07-08

## 2019-07-01 NOTE — Progress Notes (Signed)
Virtual Visit via Video Note  I connected with [name] on 07/01/19 by a video enabled telemedicine application and verified that I am speaking with the correct person using two identifiers.  Location patient: home Location provider:work office Persons participating in the virtual visit: patient, provider  I discussed the limitations of evaluation and management by telemedicine and the availability of in person appointments. The patient expressed understanding and agreed to proceed.   HPI: [name] is a 48 year old female with history of depression, bipolar disorder, chronic back pain who was bitten by her dog yesterday around 3:30 PM. She was feeding her dog with treats, she put her right index finger in dog's mouth and accidentally got bitten. She has small "broken skin", finger nail was compromised.  Her dog has all his vaccines up-to-date. Her last Tdap in 2017. She cleaned wound with peroxide.  Mild edema and pain of right index distal phalange. She has taking Advil.  Negative for fever, chills, cyanosis,cold extremity,numbness, or limitation of IP joint range of motion.  ROS: See pertinent positives and negatives per HPI.  Past Medical History:  Diagnosis Date   Anxiety    Bipolar disorder ([place])    Depression    History of kidney stones    Seizures ([place]) ~ 2009   "from taking Wellbutrin"   Symptomatic bradycardia     Past Surgical History:  Procedure Laterality Date   COLONOSCOPY  05/2001   /notes 06/04/2001   ESOPHAGOGASTRODUODENOSCOPY  11/2002   Archie Endo 12/05/2002   EYE SURGERY     LAPAROSCOPIC CHOLECYSTECTOMY  12/2002   LITHOTRIPSY  X 1   STRABISMUS SURGERY  1975; 1997   "both eyes; right eye"   TONSILLECTOMY  1980    No family history on file.  Social History   Socioeconomic History   Marital status: Divorced    Spouse name: Not on file   Number of children: Not on file   Years of education: Not on file   Highest education level:  Not on file  Occupational History   Not on file  Social Needs   Financial resource strain: Not on file   Food insecurity    Worry: Not on file    Inability: Not on file   Transportation needs    Medical: Not on file    Non-medical: Not on file  Tobacco Use   Smoking status: Former Smoker    Quit date: 10/15/2017    Years since quitting: 1.7   Smokeless tobacco: Never Used  Substance and Sexual Activity   Alcohol use: Yes   Drug use: No   Sexual activity: Yes    Birth control/protection: None  Lifestyle   Physical activity    Days per week: Not on file    Minutes per session: Not on file   Stress: Not on file  Relationships   Social connections    Talks on phone: Not on file    Gets together: Not on file    Attends religious service: Not on file    Active member of club or organization: Not on file    Attends meetings of clubs or organizations: Not on file    Relationship status: Not on file   Intimate partner violence    Fear of current or ex partner: Not on file    Emotionally abused: Not on file    Physically abused: Not on file    Forced sexual activity: Not on file  Other Topics Concern   Not on  file  Social History Narrative   Not on file    Current Outpatient Medications:    amoxicillin-clavulanate (AUGMENTIN) 875-125 MG tablet, Take 1 tablet by mouth 2 (two) times daily for 7 days., Disp: 14 tablet, Rfl: 0   Cariprazine HCl 6 MG CAPS, Take 6 mg by mouth daily., Disp: , Rfl:    DULoxetine (CYMBALTA) 60 MG capsule, Take 60 mg by mouth 2 (two) times daily., Disp: , Rfl:    ibuprofen (ADVIL,MOTRIN) 600 MG tablet, Take 1 tablet (600 mg total) by mouth every 6 (six) hours as needed., Disp: 30 tablet, Rfl: 0   Levothyroxine Sodium (SYNTHROID PO), Take by mouth., Disp: , Rfl:    LORazepam (ATIVAN) 1 MG tablet, Take 1 mg by mouth every 6 (six) hours as needed for anxiety., Disp: , Rfl:    methocarbamol (ROBAXIN) 500 MG tablet, Take 1 tablet (500  mg total) by mouth 2 (two) times daily as needed for muscle spasms., Disp: 30 tablet, Rfl: 1   Multiple Vitamin (MULTI-VITAMIN DAILY PO), Take by mouth., Disp: , Rfl:   EXAM:  VITALS per patient if applicable:N/A  GENERAL: alert, oriented, appears well and in no acute distress  HEENT: atraumatic, conjunctiva clear, no obvious abnormalities on inspection.  NECK: normal movements of the head and neck  LUNGS: on inspection no signs of respiratory distress, breathing rate appears normal, no obvious gross SOB, gasping or wheezing  CV: no obvious cyanosis  Mary: moves all visible extremities without noticeable abnormality  SKIN: Puncture wound x 1 ulnar side of right index,distal phalange. Mild edema and erythema. Finger nail with small ecchymosis of nail bed. See picture.  PSYCH/NEURO: pleasant and cooperative, no obvious depression or anxiety, speech and thought processing grossly intact          ASSESSMENT AND PLAN:  Discussed the following assessment and plan:  Dog bite, initial encounter - Plan: amoxicillin-clavulanate (AUGMENTIN) 875-125 MG tablet  Puncture wound of index finger, initial encounter  Augmentin recommended x 7 days. Her dog has vaccination up-to-date. Her last Tdap in 2017. Recommend avoiding hydrogen peroxide solution, she can keep wound clean with soap and water. Keep finger elevated. I do not think imaging is needed at this time. Monitor for signs of infection or other complication. Instructed about warning signs.   I discussed the assessment and treatment plan with the patient. She was provided an opportunity to ask questions and all were answered. She agreed with the plan and demonstrated an understanding of the instructions.   The patient was advised to call back or seek an in-person evaluation if the symptoms worsen or if the condition fails to improve as anticipated.  Return if symptoms worsen or fail to improve.    Ora Bollig [place], MD

## 2019-07-12 DIAGNOSIS — F3178 Bipolar disorder, in full remission, most recent episode mixed: Secondary | ICD-10-CM | POA: Diagnosis not present

## 2019-07-12 DIAGNOSIS — F1010 Alcohol abuse, uncomplicated: Secondary | ICD-10-CM | POA: Diagnosis not present

## 2019-07-26 ENCOUNTER — Encounter (HOSPITAL_BASED_OUTPATIENT_CLINIC_OR_DEPARTMENT_OTHER): Payer: Self-pay

## 2019-07-26 ENCOUNTER — Ambulatory Visit: Payer: Self-pay | Admitting: *Deleted

## 2019-07-26 ENCOUNTER — Emergency Department (HOSPITAL_BASED_OUTPATIENT_CLINIC_OR_DEPARTMENT_OTHER)
Admission: EM | Admit: 2019-07-26 | Discharge: 2019-07-26 | Disposition: A | Discharge status: Home / Self Care | Payer: BC Managed Care – PPO | Attending: Emergency Medicine | Admitting: Emergency Medicine

## 2019-07-26 ENCOUNTER — Emergency Department (HOSPITAL_BASED_OUTPATIENT_CLINIC_OR_DEPARTMENT_OTHER): Payer: BC Managed Care – PPO

## 2019-07-26 ENCOUNTER — Other Ambulatory Visit: Payer: Self-pay

## 2019-07-26 DIAGNOSIS — R0789 Other chest pain: Secondary | ICD-10-CM | POA: Diagnosis not present

## 2019-07-26 DIAGNOSIS — Z87891 Personal history of nicotine dependence: Secondary | ICD-10-CM | POA: Diagnosis not present

## 2019-07-26 DIAGNOSIS — Z888 Allergy status to other drugs, medicaments and biological substances status: Secondary | ICD-10-CM | POA: Insufficient documentation

## 2019-07-26 DIAGNOSIS — Z79899 Other long term (current) drug therapy: Secondary | ICD-10-CM | POA: Insufficient documentation

## 2019-07-26 DIAGNOSIS — E039 Hypothyroidism, unspecified: Secondary | ICD-10-CM | POA: Insufficient documentation

## 2019-07-26 DIAGNOSIS — R0781 Pleurodynia: Secondary | ICD-10-CM | POA: Diagnosis not present

## 2019-07-26 DIAGNOSIS — Z881 Allergy status to other antibiotic agents status: Secondary | ICD-10-CM | POA: Diagnosis not present

## 2019-07-26 NOTE — Telephone Encounter (Signed)
Pt called stating she is having rib pain for a couple of days predominately on the right; she says the area is sore to touch; it hurts if she bends over or turns to the left she has right rib pain; she is also having pain when coughing; the pt rates her pain at 5 out 10; the pt says it started last weekend, and went away; today when she coughed, and she felt a pop; that is when her discomfort became 5 out of 10; the pt says that she originally thought she pulled something; recommendations made per nurse triage protocol; she verbalized understanding; the pt sees Dr [place], Av[facility] Kluver; will route to office for notification.  Reason for Disposition . [1] Chest pain (or "angina") comes and goes AND [2] is happening more often (increasing in frequency) or getting worse (increasing in severity)  Answer Assessment - Initial Assessment Questions 1. LOCATION: "Where does it hurt?"      The side of her right breast; says feels tight like something is squeezing 2. RADIATION: "Does the pain go anywhere else?" (e.g., into neck, jaw, arms, back)     Back of rib cage 3. ONSET: "When did the chest pain begin?" (Minutes, hours or days)      07/23/2019 4. PATTERN "Does the pain come and go, or has it been constant since it started?"  "Does it get worse with exertion?"    Has gotten worse 07/27/2019 5. DURATION: "How long does it last" (e.g., seconds, minutes, hours)     days 6. SEVERITY: "How bad is the pain?"  (e.g., Scale 1-10; mild, moderate, or severe)    - MILD (1-3): doesn't interfere with normal activities     - MODERATE (4-7): interferes with normal activities or awakens from sleep    - SEVERE (8-10): excruciating pain, unable to do any normal activities       5 out 10 7. CARDIAC RISK FACTORS: "Do you have any history of heart problems or risk factors for heart disease?" (e.g., angina, prior heart attack; diabetes, high blood pressure, high cholesterol, smoker, or strong family history of heart  disease)     smoker 8. PULMONARY RISK FACTORS: "Do you have any history of lung disease?"  (e.g., blood clots in lung, asthma, emphysema, birth control pills)    no 9. CAUSE: "What do you think is causing the chest pain?"     Originally thought she pulled something 10. OTHER SYMPTOMS: "Do you have any other symptoms?" (e.g., dizziness, nausea, vomiting, sweating, fever, difficulty breathing, cough)      no 11. PREGNANCY: "Is there any chance you are pregnant?" "When was your last menstrual period?"       No LMP 07/23/2019  Protocols used: CHEST PAIN-A-AH

## 2019-07-26 NOTE — Discharge Instructions (Addendum)
You have been diagnosed by your caregiver as having chest wall pain. °SEEK IMMEDIATE MEDICAL ATTENTION IF: °You develop a fever.  °Your chest pains become severe or intolerable.  °You develop new, unexplained symptoms (problems).  °You develop shortness of breath, nausea, vomiting, sweating or feel light headed.  °You develop a new cough or you cough up blood. ° °

## 2019-07-26 NOTE — Telephone Encounter (Signed)
Will monitor for ED arrival.  

## 2019-07-26 NOTE — ED Triage Notes (Signed)
Pt c/o mid back/right rib area pain x 4 days-pain worse with movement-denies injury-pain worse today with cough x 1-denies recent cough/fever-NAD-steady gait

## 2019-07-26 NOTE — Telephone Encounter (Signed)
Pt is at the ED. °

## 2019-07-26 NOTE — ED Provider Notes (Signed)
[facility] EMERGENCY DEPARTMENT Provider Note   CSN: [id number] Arrival date & time: 07/26/19  1532     History   Chief Complaint Chief Complaint  Patient presents with  . Back Pain    HPI [name] is a 48 y.o. female who presents to the ED with a cc of right sided rib pain.  Patient states that she has had some pain on the right side of her rib cage for the past 4 days it is worse when she twists or moves.  Today she coughed and the pain became acutely worse.  She denies any shortness of breath, hemoptysis, unilateral leg swelling, history of DVT or PE.  She denies unilateral leg swelling, fevers, chills, use of exogenous estrogens, recent surgery, confinement or history of cancer.  Patient denies any rashes.     HPI  Past Medical History:  Diagnosis Date  . Anxiety   . Bipolar disorder ([place])   . Depression   . History of kidney stones   . Seizures ([place]) ~ 2009   "from taking Wellbutrin"  . Symptomatic bradycardia     Patient Active Problem List   Diagnosis Date Noted  . Chronic lower back pain 12/14/2018  . ADHD 12/14/2018  . Hypothyroidism (acquired) 12/14/2018  . 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 Fall   . Symptomatic bradycardia 07/29/2016  . Hypotension 07/29/2016  . Smoker 07/29/2016  . Bipolar disorder [facility])     Past Surgical History:  Procedure Laterality Date  . COLONOSCOPY  05/2001   Archie Endo 06/04/2001  . ESOPHAGOGASTRODUODENOSCOPY  11/2002   Archie Endo 12/05/2002  . EYE SURGERY    . LAPAROSCOPIC CHOLECYSTECTOMY  12/2002  . LITHOTRIPSY  X [place]; 1997   "both eyes; right eye"  . TONSILLECTOMY  1980     OB History   No obstetric history on file.      Home Medications    Prior to Admission medications   Medication Sig Start Date End Date Taking? Authorizing Provider  Cariprazine HCl 6 MG CAPS Take 6 mg by mouth daily.    [provider]  DULoxetine (CYMBALTA)  60 MG capsule Take 60 mg by mouth 2 (two) times daily.    [provider]  ibuprofen (ADVIL,MOTRIN) 600 MG tablet Take 1 tablet (600 mg total) by mouth every 6 (six) hours as needed. 10/13/17   Horton, Barbette Hair, MD  Levothyroxine Sodium (SYNTHROID PO) Take by mouth.    [provider]  LORazepam (ATIVAN) 1 MG tablet Take 1 mg by mouth every 6 (six) hours as needed for anxiety.    [provider]  methocarbamol (ROBAXIN) 500 MG tablet Take 1 tablet (500 mg total) by mouth 2 (two) times daily as needed for muscle spasms. 12/14/18   Martinique, Betty G, MD  Multiple Vitamin (MULTI-VITAMIN DAILY PO) Take by mouth.    [provider]    Family History No family history on file.  Social History Social History   Tobacco Use  . Smoking status: Former Smoker    Quit date: 10/15/2017    Years since quitting: 1.7  . Smokeless tobacco: Never Used  Substance Use Topics  . Alcohol use: Yes    Comment: occ  . Drug use: No     Allergies   Ciprofloxacin, Ambien [zolpidem tartrate], and Wellbutrin [bupropion]   Review of Systems Review of Systems Ten systems reviewed and are negative for acute change, except as  noted in the HPI.    Physical Exam Updated Vital Signs BP 121/67 (BP Location: Left Arm)   Pulse 73   Temp 98.1 F (36.7 C) (Oral)   Resp 18   Ht 5\' 6"  (1.676 m)   Wt 98 kg   LMP 07/23/2019   SpO2 100%   BMI 34.86 kg/m   Physical Exam Vitals signs and nursing note reviewed.  Constitutional:      General: She is not in acute distress.    Appearance: She is well-developed. She is not diaphoretic.  HENT:     Head: Normocephalic and atraumatic.  Eyes:     General: No scleral icterus.    Conjunctiva/sclera: Conjunctivae normal.  Neck:     Musculoskeletal: Normal range of motion.  Cardiovascular:     Rate and Rhythm: Normal rate and regular rhythm.     Heart sounds: Normal heart sounds. No murmur. No friction rub. No gallop.   Pulmonary:      Effort: Pulmonary effort is normal. No respiratory distress.     Breath sounds: Normal breath sounds.  Chest:     Chest wall: Tenderness present. No mass, deformity or crepitus.       Comments: No rashes, no swelling, no crepitus or step-off.  Spastic tissue present along the lateral chest wall consistent with serratus anterior spasm. Abdominal:     General: Bowel sounds are normal. There is no distension.     Palpations: Abdomen is soft. There is no mass.     Tenderness: There is no abdominal tenderness. There is no guarding.  Skin:    General: Skin is warm and dry.  Neurological:     Mental Status: She is alert and oriented to person, place, and time.  Psychiatric:        Behavior: Behavior normal.      ED Treatments / Results  Labs (all labs ordered are listed, but only abnormal results are displayed) Labs Reviewed - No data to display  EKG None  Radiology Dg Ribs Unilateral W/chest Right  Result Date: 07/26/2019 CLINICAL DATA:  Right rib pain for 4 days, worse with movement, denies injury EXAM: RIGHT RIBS AND CHEST - 3+ VIEW COMPARISON:  Radiograph 07/29/2016 FINDINGS: No fracture or other bone lesions are seen involving the ribs. There is no evidence of pneumothorax or pleural effusion. Both lungs are clear. Heart size and mediastinal contours are within normal limits. Cholecystectomy clips are present in the right upper quadrant. IMPRESSION: No visible displaced rib fractures or acute cardiopulmonary abnormality. Electronically Signed   By: Kreg ShropshirePrice  DeHay M.D.   On: 07/26/2019 16:04    Procedures Procedures (including critical care time)  Medications Ordered in ED Medications - No data to display   Initial Impression / Assessment and Plan / ED Course  I have reviewed the triage vital signs and the nursing notes.  Pertinent labs & imaging results that were available during my care of the patient were reviewed by me and considered in my medical decision making (see  chart for details).        Patient with acute chest wall pain.  No evidence of zoster.  She has no hypoxia, pleuritic chest pain or tachycardia.  She has reproducible chest pain with palpation of the tissue in the area of the serratus anterior suggestive of acute muscle spasm.  Discussed supportive care and return precautions.  Patient appears appropriate for discharge at this time.  She is afebrile and hemodynamically stable. Final Clinical Impressions(s) / ED  Diagnoses   Final diagnoses:  None    ED Discharge Orders    None       Arthor Captain, PA-C 07/26/19 1656    Rolan Bucco, MD 07/26/19 1756

## 2019-08-02 ENCOUNTER — Other Ambulatory Visit: Payer: Self-pay

## 2019-08-02 ENCOUNTER — Ambulatory Visit: Payer: BC Managed Care – PPO | Attending: Internal Medicine

## 2019-08-02 DIAGNOSIS — Z20822 Contact with and (suspected) exposure to covid-19: Secondary | ICD-10-CM

## 2019-08-02 DIAGNOSIS — Z20828 Contact with and (suspected) exposure to other viral communicable diseases: Secondary | ICD-10-CM | POA: Diagnosis not present

## 2019-08-03 LAB — NOVEL CORONAVIRUS, NAA: SARS-CoV-2, NAA: NOT DETECTED

## 2019-08-16 ENCOUNTER — Encounter: Payer: Self-pay | Admitting: Family Medicine

## 2019-08-16 ENCOUNTER — Ambulatory Visit: Payer: Self-pay | Admitting: *Deleted

## 2019-08-16 ENCOUNTER — Telehealth (INDEPENDENT_AMBULATORY_CARE_PROVIDER_SITE_OTHER): Payer: BC Managed Care – PPO | Admitting: Family Medicine

## 2019-08-16 ENCOUNTER — Ambulatory Visit: Payer: BC Managed Care – PPO | Admitting: Family Medicine

## 2019-08-16 DIAGNOSIS — M79661 Pain in right lower leg: Secondary | ICD-10-CM | POA: Diagnosis not present

## 2019-08-16 MED ORDER — IBUPROFEN 600 MG PO TABS: 600.0000 mg | ORAL_TABLET | Freq: Four times a day (QID) | ORAL | 0 refills | Status: DC | PRN

## 2019-08-16 MED ORDER — IBUPROFEN 600 MG PO TABS: 600.0000 mg | ORAL_TABLET | Freq: Four times a day (QID) | ORAL | 0 refills | Status: AC | PRN

## 2019-08-16 NOTE — Telephone Encounter (Signed)
  Patient complains of Right lower leg pain- started Sunday- not getting better. Hurts when walking on it. Call to office for appointment Reason for Disposition . [1] Thigh or calf pain AND [2] only 1 side AND [3] present > 1 hour  Answer Assessment - Initial Assessment Questions 1. ONSET: "When did the pain start?"      Started- Sunday night 2. LOCATION: "Where is the pain located?"      Right lower leg- calve area more inside toward shin 3. PAIN: "How bad is the pain?"    (Scale 1-10; or mild, moderate, severe)   -  MILD (1-3): doesn't interfere with normal activities    -  MODERATE (4-7): interferes with normal activities (e.g., work or school) or awakens from sleep, limping    -  SEVERE (8-10): excruciating pain, unable to do any normal activities, unable to walk     4-5 4. WORK OR EXERCISE: "Has there been any recent work or exercise that involved this part of the body?"      Patient was on feet a lot Sunday 5. CAUSE: "What do you think is causing the leg pain?"     Thought muscle strain- but not improving 6. OTHER SYMPTOMS: "Do you have any other symptoms?" (e.g., chest pain, back pain, breathing difficulty, swelling, rash, fever, numbness, weakness)     no 7. PREGNANCY: "Is there any chance you are pregnant?" "When was your last menstrual period?"     No- LMP-12/29  Protocols used: LEG PAIN-A-AH

## 2019-08-16 NOTE — Progress Notes (Signed)
Virtual Visit via Video Note   I connected with [name] on 12.29.20 by a video enabled telemedicine application and verified that I am speaking with the correct person using two identifiers.  Location patient: home Location provider:work office Persons participating in the virtual visit: patient, provider  I discussed the limitations of evaluation and management by telemedicine and the availability of in person appointments. The patient expressed understanding and agreed to proceed.  Chief Complaint  Patient presents with  . calf pain    right lower calf    HPI: [name] is a 48 yo female with hx of HTN ,chronic back pain,and hypothyroidism c/o , R>>L. This is a new problem.  Right distal leg intermittent pain,above medial malleolus (above tattoo) that started about 2 days ago (Sunday night). No history of trauma but she states that the date of onset she "walked the entire day." Pain is intermittent, sharp, 3-4/10. Pain is exacerbated by walking and alleviated by rest.  She has not noted numbness, tingling, burning. Negative for fever, chills,CP,SOB,palpitations, edema, erythema, or cyanosis. No recent surgery, prolonged sitting/long travel, or hormonal therapy. Negative for limitation of IDL's.  She has not tried OTC medication. Problem seems to be stable.  ROS: See pertinent positives and negatives per HPI.  Past Medical History:  Diagnosis Date  . Anxiety   . Bipolar disorder ([facility])   . Depression   . History of kidney stones   . Seizures ([facility]) ~ 2009   "from taking Wellbutrin"  . Symptomatic bradycardia     Past Surgical History:  Procedure Laterality Date  . COLONOSCOPY  05/2001   Hattie Perch 06/04/2001  . ESOPHAGOGASTRODUODENOSCOPY  11/2002   Hattie Perch 12/05/2002  . EYE SURGERY    . LAPAROSCOPIC CHOLECYSTECTOMY  12/2002  . LITHOTRIPSY  X 1  . STRABISMUS SURGERY  1975; 1997   "both eyes; right eye"  . TONSILLECTOMY  1980    No family history on  file.  Social History   Socioeconomic History  . Marital status: Divorced    Spouse name: Not on file  . Number of children: Not on file  . Years of education: Not on file  . Highest education level: Not on file  Occupational History  . Not on file  Tobacco Use  . Smoking status: Former Smoker    Quit date: 10/15/2017    Years since quitting: 1.8  . Smokeless tobacco: Never Used  Substance and Sexual Activity  . Alcohol use: Yes    Comment: occ  . Drug use: No  . Sexual activity: Not on file  Other Topics Concern  . Not on file  Social History Narrative  . Not on file   Social Determinants of Health   Financial Resource Strain:   . Difficulty of Paying Living Expenses: Not on file  Food Insecurity:   . Worried About Programme researcher, broadcasting/film/video in the Last Year: Not on file  . Ran Out of Food in the Last Year: Not on file  Transportation Needs:   . Lack of Transportation (Medical): Not on file  . Lack of Transportation (Non-Medical): Not on file  Physical Activity:   . Days of Exercise per Week: Not on file  . Minutes of Exercise per Session: Not on file  Stress:   . Feeling of Stress : Not on file  Social Connections:   . Frequency of Communication with Friends and Family: Not on file  . Frequency of Social Gatherings with Friends and Family: Not on  file  . Attends Religious Services: Not on file  . Active Member of Clubs or Organizations: Not on file  . Attends Archivist Meetings: Not on file  . Marital Status: Not on file  Intimate Partner Violence:   . Fear of Current or Ex-Partner: Not on file  . Emotionally Abused: Not on file  . Physically Abused: Not on file  . Sexually Abused: Not on file    Current Outpatient Medications:  .  Cariprazine HCl 6 MG CAPS, Take 6 mg by mouth daily., Disp: , Rfl:  .  DULoxetine (CYMBALTA) 60 MG capsule, Take 60 mg by mouth 2 (two) times daily., Disp: , Rfl:  .  ibuprofen (ADVIL) 600 MG tablet, Take 1 tablet (600 mg  total) by mouth every 6 (six) hours as needed for up to 10 days., Disp: 30 tablet, Rfl: 0 .  levothyroxine (SYNTHROID) 25 MCG tablet, Take 25 mcg by mouth daily., Disp: , Rfl:  .  LORazepam (ATIVAN) 1 MG tablet, Take 1 mg by mouth every 6 (six) hours as needed for anxiety., Disp: , Rfl:  .  methocarbamol (ROBAXIN) 500 MG tablet, Take 1 tablet (500 mg total) by mouth 2 (two) times daily as needed for muscle spasms., Disp: 30 tablet, Rfl: 1 .  Multiple Vitamin (MULTI-VITAMIN DAILY PO), Take by mouth., Disp: , Rfl:   EXAM:  VITALS per patient if applicable:LMP 95/18/8416   GENERAL: alert, oriented, appears well and in no acute distress  HEENT: atraumatic, conjunctiva clear, no obvious abnormalities on inspection.  NECK: normal movements of the head and neck  LUNGS: on inspection no signs of respiratory distress, breathing rate appears normal, no obvious gross SOB, gasping or wheezing  CV: no obvious cyanosis  Mary: moves all visible extremities without noticeable abnormality. No pain elicited with ankle or knee ROM. Mildly "sore" upon palpating area. I do not appreciate edema or erythema. No anatomical abnormalities/deformities appreciated.  PSYCH/NEURO: pleasant and cooperative, no obvious depression or anxiety, speech and thought processing grossly intact  ASSESSMENT AND PLAN:  Discussed the following assessment and plan:  Right calf pain - Plan: ibuprofen (ADVIL) 600 MG tablet, DISCONTINUED: ibuprofen (ADVIL) 600 MG tablet  Hx and examination (mainly inspection) do not suggest DVT. Explained that the likelihood for this to be caused by a thrombotic process is very low, never 0. ?  Medial tibial stress syndrome. Recommend local ice, relative rest (activity as tolerated), and ibuprofen 600 mg 3 times daily with food for 7 to 10 days. We discussed some side effects of NSAIDs like ibuprofen and the possible interaction with SNRIs like Cymbalta.  I do not think imaging or further work-up  are needed today. She was clearly instructed about warning signs.  She voices understanding and agrees with plan.    I discussed the assessment and treatment plan with the patient. She was provided an opportunity to ask questions and all were answered. She agreed with the plan and demonstrated an understanding of the instructions.   The patient was advised to call back or seek an in-person evaluation if the symptoms worsen or if the condition fails to improve as anticipated.  Return if symptoms worsen or fail to improve.    Jon Lall Martinique, MD

## 2019-08-16 NOTE — Telephone Encounter (Signed)
Pt has been scheduled.  °

## 2019-08-17 ENCOUNTER — Telehealth: Payer: BC Managed Care – PPO | Admitting: Family Medicine

## 2019-09-19 DIAGNOSIS — F1011 Alcohol abuse, in remission: Secondary | ICD-10-CM | POA: Diagnosis not present

## 2019-09-19 DIAGNOSIS — F900 Attention-deficit hyperactivity disorder, predominantly inattentive type: Secondary | ICD-10-CM | POA: Diagnosis not present

## 2019-09-19 DIAGNOSIS — F3178 Bipolar disorder, in full remission, most recent episode mixed: Secondary | ICD-10-CM | POA: Diagnosis not present

## 2019-09-19 DIAGNOSIS — G4726 Circadian rhythm sleep disorder, shift work type: Secondary | ICD-10-CM | POA: Diagnosis not present

## 2019-09-21 DIAGNOSIS — F413 Other mixed anxiety disorders: Secondary | ICD-10-CM | POA: Diagnosis not present

## 2019-09-28 DIAGNOSIS — F413 Other mixed anxiety disorders: Secondary | ICD-10-CM | POA: Diagnosis not present

## 2019-10-08 ENCOUNTER — Other Ambulatory Visit: Payer: Self-pay

## 2019-10-08 ENCOUNTER — Emergency Department (HOSPITAL_BASED_OUTPATIENT_CLINIC_OR_DEPARTMENT_OTHER): Payer: BC Managed Care – PPO

## 2019-10-08 ENCOUNTER — Encounter (HOSPITAL_BASED_OUTPATIENT_CLINIC_OR_DEPARTMENT_OTHER): Payer: Self-pay | Admitting: Emergency Medicine

## 2019-10-08 ENCOUNTER — Emergency Department (HOSPITAL_BASED_OUTPATIENT_CLINIC_OR_DEPARTMENT_OTHER)
Admission: EM | Admit: 2019-10-08 | Discharge: 2019-10-08 | Disposition: A | Discharge status: Home / Self Care | Payer: BC Managed Care – PPO | Attending: Emergency Medicine | Admitting: Emergency Medicine

## 2019-10-08 DIAGNOSIS — Z888 Allergy status to other drugs, medicaments and biological substances status: Secondary | ICD-10-CM | POA: Diagnosis not present

## 2019-10-08 DIAGNOSIS — E039 Hypothyroidism, unspecified: Secondary | ICD-10-CM | POA: Diagnosis not present

## 2019-10-08 DIAGNOSIS — Z87891 Personal history of nicotine dependence: Secondary | ICD-10-CM | POA: Insufficient documentation

## 2019-10-08 DIAGNOSIS — Z881 Allergy status to other antibiotic agents status: Secondary | ICD-10-CM | POA: Diagnosis not present

## 2019-10-08 DIAGNOSIS — R0789 Other chest pain: Secondary | ICD-10-CM | POA: Insufficient documentation

## 2019-10-08 DIAGNOSIS — Z79899 Other long term (current) drug therapy: Secondary | ICD-10-CM | POA: Insufficient documentation

## 2019-10-08 DIAGNOSIS — R079 Chest pain, unspecified: Secondary | ICD-10-CM | POA: Diagnosis not present

## 2019-10-08 LAB — URINALYSIS, ROUTINE W REFLEX MICROSCOPIC
Bilirubin Urine: NEGATIVE
Glucose, UA: NEGATIVE mg/dL
Ketones, ur: NEGATIVE mg/dL
Leukocytes,Ua: NEGATIVE
Nitrite: NEGATIVE
Protein, ur: NEGATIVE mg/dL
Specific Gravity, Urine: 1.02 (ref 1.005–1.030)
pH: 6 (ref 5.0–8.0)

## 2019-10-08 LAB — URINALYSIS, MICROSCOPIC (REFLEX)

## 2019-10-08 MED ORDER — CYCLOBENZAPRINE HCL 5 MG PO TABS: 5.0000 mg | ORAL_TABLET | Freq: Two times a day (BID) | ORAL | 0 refills | Status: DC | PRN

## 2019-10-08 MED ORDER — KETOROLAC TROMETHAMINE 60 MG/2ML IM SOLN
30.0000 mg | Freq: Once | INTRAMUSCULAR | Status: AC
  Administered 2019-10-08: 30 mg via INTRAMUSCULAR
  Filled 2019-10-08: qty 2

## 2019-10-08 MED ORDER — LIDOCAINE 5 % EX PTCH: 1.0000 | MEDICATED_PATCH | CUTANEOUS | 0 refills | Status: DC

## 2019-10-08 MED ORDER — DICLOFENAC SODIUM 1 % EX GEL: 4.0000 g | Freq: Four times a day (QID) | CUTANEOUS | 2 refills | Status: DC

## 2019-10-08 NOTE — Discharge Instructions (Addendum)
  Take it easy, but do not lay around too much as this may make any stiffness worse.  Antiinflammatory medications: Take 600 mg of ibuprofen every 6 hours or 440 mg (over the counter dose) to 500 mg (prescription dose) of naproxen every 12 hours for the next 3 days. After this time, these medications may be used as needed for pain. Take these medications with food to avoid upset stomach. Choose only one of these medications, do not take them together. Acetaminophen (generic for Tylenol): Should you continue to have additional pain while taking the ibuprofen or naproxen, you may add in acetaminophen as needed. Your daily total maximum amount of acetaminophen from all sources should be limited to 4000mg /day for persons without liver problems, or 2000mg /day for those with liver problems. Cyclobenzaprine: Cyclobenzaprine (generic for Flexeril) is a muscle relaxer and can help relieve stiff muscles or muscle spasms.  Do not drive or perform other dangerous activities while taking this medication as it can cause drowsiness as well as changes in reaction time and judgement. You may take 5-10 mg of this medication twice daily. Lidocaine patches: These are available via either prescription or over-the-counter. The over-the-counter option may be more economical one and are likely just as effective. There are multiple over-the-counter brands, such as Salonpas. Diclofenac gel: This is a topical anti-inflammatory medication and can be applied directly to the painful region.  Do not use on the face or genitals.  This medication may be used as an alternative to oral anti-inflammatory medications, such as ibuprofen or naproxen. Ice: May apply ice to the area over the next 24 hours for 15 minutes at a time to reduce pain, inflammation, and swelling, if present. Exercises: Be sure to perform the attached exercises starting with three times a week and working up to performing them daily. This is an essential part of preventing  long term problems.  The exercises are labeled for back pain, however, many of these movements should help stretch the muscles in the region where your pain is located. Follow up: Follow up with a primary care provider for any future management of these complaints. Be sure to follow up within 7-10 days. Return: Return to the ED should symptoms worsen.  For prescription assistance, may try using prescription discount sites or apps, such as goodrx.com

## 2019-10-08 NOTE — ED Provider Notes (Signed)
[facility] DEPARTMENT Provider Note   CSN: [id number] Arrival date & time: 10/08/19  0848     History Chief Complaint  Patient presents with  . Intercostal pain    [name] is a 49 y.o. female.  HPI      [name] is a 49 y.o. female, with a history of bipolar, presenting to the ED with left lateral chest discomfort for the past week.  Pain is described as a soreness, worse with palpation or movement, moderate in intensity, nonradiating.  No increased pain with deep breathing or with exertion. She states she thought the pain may be a pulled muscle.  She has been working from home and has a set up that requires her to twist side to side.  She has intermittently taken Tylenol and Robaxin without resolution. LMP beginning February 16.  Patient is in a committed lesbian relationship; no possibility for pregnancy. Denies history of PE/DVT, recent surgery, recent trauma, immobilization. Denies fever/chills, cough, other chest pain, shortness of breath, abdominal pain, diaphoresis, dizziness, urinary symptoms, N/V/D, falls/trauma, or any other complaints.      Past Medical History:  Diagnosis Date  . Anxiety   . Bipolar disorder ([place])   . Depression   . History of kidney stones   . Seizures ([place]) ~ 2009   "from taking Wellbutrin"  . Symptomatic bradycardia     Patient Active Problem List   Diagnosis Date Noted  . Chronic lower back pain 12/14/2018  . ADHD 12/14/2018  . Hypothyroidism (acquired) 12/14/2018  . 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 Fall   . Symptomatic bradycardia 07/29/2016  . Hypotension 07/29/2016  . Smoker 07/29/2016  . Bipolar disorder [facility])     Past Surgical History:  Procedure Laterality Date  . COLONOSCOPY  05/2001   Archie Endo 06/04/2001  . ESOPHAGOGASTRODUODENOSCOPY  11/2002   Archie Endo 12/05/2002  . EYE SURGERY    . LAPAROSCOPIC CHOLECYSTECTOMY  12/2002  . LITHOTRIPSY  X [place]; 1997   "both eyes; right eye"  . TONSILLECTOMY  1980     OB History   No obstetric history on file.     History reviewed. No pertinent family history.  Social History   Tobacco Use  . Smoking status: Former Smoker    Quit date: 10/15/2017    Years since quitting: 1.9  . Smokeless tobacco: Never Used  Substance Use Topics  . Alcohol use: Yes    Comment: occ  . Drug use: No    Home Medications Prior to Admission medications   Medication Sig Start Date End Date Taking? Authorizing Provider  Cariprazine HCl 6 MG CAPS Take 6 mg by mouth daily.    [provider]  cyclobenzaprine (FLEXERIL) 5 MG tablet Take 1 tablet (5 mg total) by mouth 2 (two) times daily as needed for muscle spasms. 10/08/19   Tabari Volkert C, PA-C  diclofenac Sodium (VOLTAREN) 1 % GEL Apply 4 g topically 4 (four) times daily. 10/08/19   Talley Kreiser C, PA-C  DULoxetine (CYMBALTA) 60 MG capsule Take 60 mg by mouth 2 (two) times daily.    [provider]  levothyroxine (SYNTHROID) 25 MCG tablet Take 25 mcg by mouth daily. 04/27/19   [provider]  lidocaine (LIDODERM) 5 % Place 1 patch onto the skin daily. Remove & Discard patch within 12 hours or as directed by MD 10/08/19   Deborrah Mabin C, PA-C  LORazepam (ATIVAN) 1  MG tablet Take 1 mg by mouth every 6 (six) hours as needed for anxiety.    [provider]  methocarbamol (ROBAXIN) 500 MG tablet Take 1 tablet (500 mg total) by mouth 2 (two) times daily as needed for muscle spasms. 12/14/18   [place], Betty G, MD  Multiple Vitamin (MULTI-VITAMIN DAILY PO) Take by mouth.    [provider]    Allergies    Ciprofloxacin, Ambien [zolpidem tartrate], and Wellbutrin [bupropion]  Review of Systems   Review of Systems  Constitutional: Negative for chills, diaphoresis and fever.  Respiratory: Negative for cough and shortness of breath.   Cardiovascular: Negative for chest pain and leg swelling.    Gastrointestinal: Negative for abdominal pain, diarrhea, nausea and vomiting.  Genitourinary: Negative for dysuria and hematuria.  Musculoskeletal: Negative for back pain.       Left lateral chest soreness  Neurological: Negative for dizziness, syncope, weakness and numbness.  All other systems reviewed and are negative.   Physical Exam Updated Vital Signs BP 116/65 (BP Location: Right Arm)   Pulse 78   Temp 98.9 F (37.2 C) (Oral)   Resp 18   SpO2 100%   Physical Exam Vitals and nursing note reviewed.  Constitutional:      General: She is not in acute distress.    Appearance: She is well-developed. She is not diaphoretic.  HENT:     Head: Normocephalic and atraumatic.     Mouth/Throat:     Mouth: Mucous membranes are moist.     Pharynx: Oropharynx is clear.  Eyes:     Conjunctiva/sclera: Conjunctivae normal.  Cardiovascular:     Rate and Rhythm: Normal rate and regular rhythm.     Pulses: Normal pulses.          Radial pulses are 2+ on the right side and 2+ on the left side.     Heart sounds: Normal heart sounds.     Comments: Tactile temperature in the extremities appropriate and equal bilaterally. Pulmonary:     Effort: Pulmonary effort is normal. No respiratory distress.     Breath sounds: Normal breath sounds.     Comments: No increased work of breathing.  Speaks in full sentences without difficulty. Chest:     Chest wall: Tenderness present. No deformity, swelling, crepitus or edema.       Comments: Mild tenderness to the left lateral chest without swelling, color change, rash, deformity, or instability. Abdominal:     Palpations: Abdomen is soft.     Tenderness: There is no abdominal tenderness. There is no guarding.  Musculoskeletal:     Cervical back: Neck supple.     Right lower leg: No edema.     Left lower leg: No edema.  Lymphadenopathy:     Cervical: No cervical adenopathy.  Skin:    General: Skin is warm and dry.     Findings: No rash.   Neurological:     Mental Status: She is alert.  Psychiatric:        Mood and Affect: Mood and affect normal.        Speech: Speech normal.        Behavior: Behavior normal.     ED Results / Procedures / Treatments   Labs (all labs ordered are listed, but only abnormal results are displayed) Labs Reviewed  URINALYSIS, ROUTINE W REFLEX MICROSCOPIC - Abnormal; Notable for the following components:      Result Value   APPearance HAZY (*)  Hgb urine dipstick SMALL (*)    All other components within normal limits  URINALYSIS, MICROSCOPIC (REFLEX) - Abnormal; Notable for the following components:   Bacteria, UA MANY (*)    All other components within normal limits  URINE CULTURE    EKG None  Radiology DG Chest 2 View  Result Date: 10/08/2019 CLINICAL DATA:  Left chest pain under left breast 1 week. No injury. EXAM: CHEST - 2 VIEW COMPARISON:  07/26/2019 FINDINGS: Lungs are adequately inflated and otherwise clear. Cardiomediastinal silhouette is normal. Bones and soft tissues are normal. IMPRESSION: No acute findings. Electronically Signed   By: Elberta Fortis M.D.   On: 10/08/2019 09:36    Procedures Procedures (including critical care time)  Medications Ordered in ED Medications  ketorolac (TORADOL) injection 30 mg (30 mg Intramuscular Given 10/08/19 0940)    ED Course  I have reviewed the triage vital signs and the nursing notes.  Pertinent labs & imaging results that were available during my care of the patient were reviewed by me and considered in my medical decision making (see chart for details).  Clinical Course as of Oct 07 1109  Sat Oct 08, 2019  1009 Patient currently menstruating.  Hgb urine dipstick(!): SMALL [SJ]    Clinical Course User Index [SJ] Manuel Dall, Hillard Danker, PA-C   MDM Rules/Calculators/A&P                       Patient is nontoxic appearing, afebrile, not tachycardic, not tachypneic, not hypotensive, excellent SPO2 on room air, and is in no  apparent distress.   I have reviewed the patient's chart to obtain more information.   Low suspicion for ACS. No high risk/suspicious features; no exertional chest pain, vomiting, diaphoresis, or radiation. HEART score is 1, indicating low risk for a cardiac event.  Wells criteria score is 0, indicating low risk for PE.  PERC negative. Dissection was considered, but thought less likely base on: History and description of the pain are not suggestive, patient is not ill-appearing, lack of risk factors, equal bilateral pulses, lack of neurologic deficits, no widened mediastinum on chest x-ray.  I reviewed and interpreted the patient's labs and radiological studies.  The patient was given instructions for home care as well as return precautions. Patient voices understanding of these instructions, accepts the plan, and is comfortable with discharge.   Final Clinical Impression(s) / ED Diagnoses Final diagnoses:  Left-sided chest wall pain    Rx / DC Orders ED Discharge Orders         Ordered    cyclobenzaprine (FLEXERIL) 5 MG tablet  2 times daily PRN     10/08/19 1018    lidocaine (LIDODERM) 5 %  Every 24 hours     10/08/19 1018    diclofenac Sodium (VOLTAREN) 1 % GEL  4 times daily     10/08/19 1018           Anselm Pancoast, PA-C 10/08/19 1118    Milagros Loll, MD 10/08/19 1429

## 2019-10-08 NOTE — ED Triage Notes (Signed)
Pt here with left rib cage pain x 1 week. Movement makes it worse and it has gotten progressively.

## 2019-10-09 LAB — URINE CULTURE

## 2019-10-12 DIAGNOSIS — F413 Other mixed anxiety disorders: Secondary | ICD-10-CM | POA: Diagnosis not present

## 2019-10-24 ENCOUNTER — Encounter: Payer: Self-pay | Admitting: Family Medicine

## 2019-10-24 ENCOUNTER — Telehealth (INDEPENDENT_AMBULATORY_CARE_PROVIDER_SITE_OTHER): Payer: BC Managed Care – PPO | Admitting: Family Medicine

## 2019-10-24 DIAGNOSIS — M25532 Pain in left wrist: Secondary | ICD-10-CM | POA: Diagnosis not present

## 2019-10-24 DIAGNOSIS — E039 Hypothyroidism, unspecified: Secondary | ICD-10-CM | POA: Diagnosis not present

## 2019-10-24 DIAGNOSIS — R200 Anesthesia of skin: Secondary | ICD-10-CM

## 2019-10-24 MED ORDER — PREDNISONE 20 MG PO TABS: ORAL_TABLET | ORAL | 0 refills | Status: AC

## 2019-10-24 NOTE — Progress Notes (Signed)
Virtual Visit via Video Note   I connected with [name] on 10/24/19 by a video enabled telemedicine application and verified that I am speaking with the correct person using two identifiers.  Location patient: home Location provider:work office Persons participating in the virtual visit: patient, provider  I discussed the limitations of evaluation and management by telemedicine and the availability of in person appointments. The patient expressed understanding and agreed to proceed.   Chief Complaint  Patient presents with  . left hand & wrist pain    HPI: [name] is a 49 yo female with history of chronic back pain, bipolar disorder, hypothyroidism, and hypertension complaining of a week of left wrist pain and numbness of fifth finger and hypothenar palmar area. This is a new problem. Right handed. Negative for fever, chills, oral lesions, neck pain, shoulder pain, elbow pain, and skin rash.  Wrist pain is exacerbated by palpation and pressure, ulnar side. Numbness is constant. She has not noted focal weakness or limitation of ROM.  No history of trauma or unusual physical activity. She has not tried OTC medication. Problem is getting worse.  Hypothyroidism, currently she is on levothyroxine 25 mcg daily. She follows with gynecologist. Last TSH was ordered by her psychiatrist over a year ago.  ROS: See pertinent positives and negatives per HPI.  Past Medical History:  Diagnosis Date  . Anxiety   . Bipolar disorder ([facility])   . Depression   . History of kidney stones   . Seizures ([facility]) ~ 2009   "from taking Wellbutrin"  . Symptomatic bradycardia     Past Surgical History:  Procedure Laterality Date  . COLONOSCOPY  05/2001   Hattie Perch 06/04/2001  . ESOPHAGOGASTRODUODENOSCOPY  11/2002   Hattie Perch 12/05/2002  . EYE SURGERY    . LAPAROSCOPIC CHOLECYSTECTOMY  12/2002  . LITHOTRIPSY  X 1  . STRABISMUS SURGERY  1975; 1997   "both eyes; right eye"  . TONSILLECTOMY  1980     No family history on file.  Social History   Socioeconomic History  . Marital status: Divorced    Spouse name: Not on file  . Number of children: Not on file  . Years of education: Not on file  . Highest education level: Not on file  Occupational History  . Not on file  Tobacco Use  . Smoking status: Former Smoker    Quit date: 10/15/2017    Years since quitting: 2.0  . Smokeless tobacco: Never Used  Substance and Sexual Activity  . Alcohol use: Yes    Comment: occ  . Drug use: No  . Sexual activity: Not on file  Other Topics Concern  . Not on file  Social History Narrative  . Not on file   Social Determinants of Health   Financial Resource Strain:   . Difficulty of Paying Living Expenses: Not on file  Food Insecurity:   . Worried About Programme researcher, broadcasting/film/video in the Last Year: Not on file  . Ran Out of Food in the Last Year: Not on file  Transportation Needs:   . Lack of Transportation (Medical): Not on file  . Lack of Transportation (Non-Medical): Not on file  Physical Activity:   . Days of Exercise per Week: Not on file  . Minutes of Exercise per Session: Not on file  Stress:   . Feeling of Stress : Not on file  Social Connections:   . Frequency of Communication with Friends and Family: Not on file  . Frequency  of Social Gatherings with Friends and Family: Not on file  . Attends Religious Services: Not on file  . Active Member of Clubs or Organizations: Not on file  . Attends Archivist Meetings: Not on file  . Marital Status: Not on file  Intimate Partner Violence:   . Fear of Current or Ex-Partner: Not on file  . Emotionally Abused: Not on file  . Physically Abused: Not on file  . Sexually Abused: Not on file    Current Outpatient Medications:  .  Cariprazine HCl 6 MG CAPS, Take 6 mg by mouth daily., Disp: , Rfl:  .  cyclobenzaprine (FLEXERIL) 5 MG tablet, Take 1 tablet (5 mg total) by mouth 2 (two) times daily as needed for muscle spasms.,  Disp: 20 tablet, Rfl: 0 .  diclofenac Sodium (VOLTAREN) 1 % GEL, Apply 4 g topically 4 (four) times daily., Disp: 100 g, Rfl: 2 .  DULoxetine (CYMBALTA) 60 MG capsule, Take 60 mg by mouth 2 (two) times daily., Disp: , Rfl:  .  levothyroxine (SYNTHROID) 25 MCG tablet, Take 25 mcg by mouth daily., Disp: , Rfl:  .  lidocaine (LIDODERM) 5 %, Place 1 patch onto the skin daily. Remove & Discard patch within 12 hours or as directed by MD, Disp: 30 patch, Rfl: 0 .  LORazepam (ATIVAN) 1 MG tablet, Take 1 mg by mouth every 6 (six) hours as needed for anxiety., Disp: , Rfl:  .  methocarbamol (ROBAXIN) 500 MG tablet, Take 1 tablet (500 mg total) by mouth 2 (two) times daily as needed for muscle spasms., Disp: 30 tablet, Rfl: 1 .  Multiple Vitamin (MULTI-VITAMIN DAILY PO), Take by mouth., Disp: , Rfl:  .  predniSONE (DELTASONE) 20 MG tablet, 2 tabs for 5 days, 1 tabs for 3 days, 1/2 tabs for 3 days. Take tables together with breakfast., Disp: 15 tablet, Rfl: 0  EXAM:  VITALS per patient if applicable:N/A  GENERAL: alert, oriented, appears well and in no acute distress  HEENT: atraumatic, conjunctiva clear, no obvious abnormalities on inspection.  NECK: normal movements of the head and neck  LUNGS: on inspection no signs of respiratory distress, breathing rate appears normal, no obvious gross SOB, gasping or wheezing  CV: no obvious cyanosis  Mary: moves all visible extremities without noticeable abnormality Left wrist with normal ROM,no edema,or erythema. No signs of IP joints synovitis.  PSYCH/NEURO: pleasant and cooperative, no obvious depression or anxiety, speech and thought processing grossly intact  ASSESSMENT AND PLAN:  Discussed the following assessment and plan:  Numbness of left hand - Plan: predniSONE (DELTASONE) 20 MG tablet, Vitamin B12, TSH, C-reactive protein, Sedimentation rate We discussed possible etiologies. Feet finger numbness could be related with cubital neuropathy. We  discussed options: EMG, neurology, or observation + prednisone taper. She has taken prednisone in the past and has tolerated it well, she would like to try this first. Instructed about warning signs.  Wrist pain, acute, left - Plan: C-reactive protein, Sedimentation rate OA,tendinitis among some to consider. She may benefit of wrist splint with wrist in neutral position.  Hypothyroidism (acquired) - Plan: TSH TSH 1.2 in 10/2017. No changes in current management. Levothyroxine dose will be adjusted according with these results.   She has an appointment on 11/01/2019 for her CPE.   I discussed the assessment and treatment plan with the patient. [name] was provided an opportunity to ask questions and all were answered. She agreed with the plan and demonstrated an understanding of the instructions.  Return if symptoms worsen or fail to improve.    Aragon Scarantino [place], MD

## 2019-10-25 ENCOUNTER — Encounter: Payer: Self-pay | Admitting: Family Medicine

## 2019-10-26 DIAGNOSIS — F413 Other mixed anxiety disorders: Secondary | ICD-10-CM | POA: Diagnosis not present

## 2019-10-31 ENCOUNTER — Other Ambulatory Visit: Payer: Self-pay

## 2019-11-01 ENCOUNTER — Ambulatory Visit (INDEPENDENT_AMBULATORY_CARE_PROVIDER_SITE_OTHER): Payer: BC Managed Care – PPO | Admitting: Family Medicine

## 2019-11-01 ENCOUNTER — Encounter: Payer: Self-pay | Admitting: Family Medicine

## 2019-11-01 VITALS — BP 130/70 | HR 80 | Resp 12 | Ht 66.0 in | Wt 226.0 lb

## 2019-11-01 DIAGNOSIS — M25532 Pain in left wrist: Secondary | ICD-10-CM | POA: Diagnosis not present

## 2019-11-01 DIAGNOSIS — Z0000 Encounter for general adult medical examination without abnormal findings: Secondary | ICD-10-CM | POA: Diagnosis not present

## 2019-11-01 DIAGNOSIS — E039 Hypothyroidism, unspecified: Secondary | ICD-10-CM | POA: Diagnosis not present

## 2019-11-01 DIAGNOSIS — Z130 Encounter for screening for diseases of the blood and blood-forming organs and certain disorders involving the immune mechanism: Secondary | ICD-10-CM

## 2019-11-01 DIAGNOSIS — R200 Anesthesia of skin: Secondary | ICD-10-CM

## 2019-11-01 DIAGNOSIS — Z23 Encounter for immunization: Secondary | ICD-10-CM | POA: Diagnosis not present

## 2019-11-01 DIAGNOSIS — Z13228 Encounter for screening for other metabolic disorders: Secondary | ICD-10-CM

## 2019-11-01 DIAGNOSIS — Z1329 Encounter for screening for other suspected endocrine disorder: Secondary | ICD-10-CM | POA: Diagnosis not present

## 2019-11-01 DIAGNOSIS — Z13220 Encounter for screening for lipoid disorders: Secondary | ICD-10-CM

## 2019-11-01 LAB — SEDIMENTATION RATE: Sed Rate: 43 mm/hr — ABNORMAL HIGH (ref 0–20)

## 2019-11-01 LAB — LIPID PANEL
Cholesterol: 216 mg/dL — ABNORMAL HIGH (ref 0–200)
HDL: 76 mg/dL (ref >39.00)
LDL Cholesterol: 119 mg/dL — ABNORMAL HIGH (ref 0–99)
NonHDL: 139.54
Total CHOL/HDL Ratio: 3
Triglycerides: 101 mg/dL (ref 0.0–149.0)
VLDL: 20.2 mg/dL (ref 0.0–40.0)

## 2019-11-01 LAB — TSH: TSH: 1.39 u[IU]/mL (ref 0.35–4.50)

## 2019-11-01 LAB — BASIC METABOLIC PANEL
BUN: 8 mg/dL (ref 6–23)
CO2: 28 mEq/L (ref 19–32)
Calcium: 9.2 mg/dL (ref 8.4–10.5)
Chloride: 100 mEq/L (ref 96–112)
Creatinine, Ser: 0.67 mg/dL (ref 0.40–1.20)
GFR: 94 mL/min (ref >60.00)
Glucose, Bld: 116 mg/dL — ABNORMAL HIGH (ref 70–99)
Potassium: 4.1 mEq/L (ref 3.5–5.1)
Sodium: 135 mEq/L (ref 135–145)

## 2019-11-01 LAB — HEMOGLOBIN A1C: Hgb A1c MFr Bld: 5.7 % (ref 4.6–6.5)

## 2019-11-01 LAB — C-REACTIVE PROTEIN: CRP: <1 mg/dL (ref 0.5–20.0)

## 2019-11-01 LAB — VITAMIN B12: Vitamin B-12: 157 pg/mL — ABNORMAL LOW (ref 211–911)

## 2019-11-01 NOTE — Progress Notes (Signed)
HPI:   [name] is a 49 y.o. female, who is here today for her routine physical.  Last CPE: 10/2017.  Regular exercise 3 or more time per week: None. Following a healthy diet: Yes, she is cooking at home. She lives with her wife.  Chronic medical problems: Bipolar disorder, depression, anxiety, ADHD, hypothyroidism,and back pain among some. Follows with psychiatry regularly.  Pap smear: 10/2018. She follows with gynecologist annually.  Immunization History  Administered Date(s) Administered  . Influenza Inj Mdck Quad Pf 09/09/2018  . Influenza Split 09/09/2017  . Influenza, Quadrivalent, Recombinant, Inj, Pf 05/19/2019  . Influenza,inj,Quad PF,6+ Mos 08/17/2017  . Influenza-Unspecified 05/19/2015, 05/18/2016, 08/17/2017  . Tdap 07/19/2016   Mammogram: 10/2018 at her gynecologist's office. Colonoscopy: N/A DEXA: N/A  No new concerns today.  She was last seen on 10/24/2019, virtual visit, because of left wrist pain and numbness. She has not had new symptoms. Negative for associated weakness, limitation of ROM, edema, or erythema.  Hypothyroidism: She is on Levothyroxine 25 mcg daily.  Review of Systems  Constitutional: Negative for appetite change, fatigue and fever.  HENT: Negative for hearing loss, mouth sores, trouble swallowing and voice change.   Eyes: Negative for photophobia and visual disturbance.  Respiratory: Negative for cough, shortness of breath and wheezing.   Cardiovascular: Negative for chest pain and leg swelling.  Gastrointestinal: Negative for abdominal pain, nausea and vomiting.       No changes in bowel habits.  Endocrine: Negative for cold intolerance and heat intolerance.  Genitourinary: Negative for decreased urine volume, dysuria, hematuria, vaginal bleeding and vaginal discharge.  Musculoskeletal: Positive for back pain. Negative for myalgias.  Skin: Negative for color change and rash.  Neurological: Positive for numbness (Left  5th finger and hypothenar area.). Negative for seizures, syncope, weakness and headaches.  Psychiatric/Behavioral: Negative for confusion and sleep disturbance. The patient is not nervous/anxious.   All other systems reviewed and are negative.  Current Outpatient Medications on File Prior to Visit  Medication Sig Dispense Refill  . Cariprazine HCl 6 MG CAPS Take 6 mg by mouth daily.    . cyclobenzaprine (FLEXERIL) 5 MG tablet Take 1 tablet (5 mg total) by mouth 2 (two) times daily as needed for muscle spasms. 20 tablet 0  . diclofenac Sodium (VOLTAREN) 1 % GEL Apply 4 g topically 4 (four) times daily. 100 g 2  . DULoxetine (CYMBALTA) 60 MG capsule Take 60 mg by mouth 2 (two) times daily.    Marland Kitchen levothyroxine (SYNTHROID) 25 MCG tablet Take 25 mcg by mouth daily.    Marland Kitchen lidocaine (LIDODERM) 5 % Place 1 patch onto the skin daily. Remove & Discard patch within 12 hours or as directed by MD 30 patch 0  . LORazepam (ATIVAN) 1 MG tablet Take 1 mg by mouth every 6 (six) hours as needed for anxiety.    . methocarbamol (ROBAXIN) 500 MG tablet Take 1 tablet (500 mg total) by mouth 2 (two) times daily as needed for muscle spasms. 30 tablet 1  . Multiple Vitamin (MULTI-VITAMIN DAILY PO) Take by mouth.     No current facility-administered medications on file prior to visit.   Past Medical History:  Diagnosis Date  . Anxiety   . Bipolar disorder (HCC)   . Depression   . History of kidney stones   . Seizures (HCC) ~ 2009   "from taking Wellbutrin"  . Symptomatic bradycardia    Past Surgical History:  Procedure Laterality Date  .  COLONOSCOPY  05/2001   Hattie Perch 06/04/2001  . ESOPHAGOGASTRODUODENOSCOPY  11/2002   Hattie Perch 12/05/2002  . EYE SURGERY    . LAPAROSCOPIC CHOLECYSTECTOMY  12/2002  . LITHOTRIPSY  X 1  . STRABISMUS SURGERY  1975; 1997   "both eyes; right eye"  . TONSILLECTOMY  1980   Allergies  Allergen Reactions  . Ciprofloxacin Anaphylaxis  . Ambien [Zolpidem Tartrate]     Seizure   .  Wellbutrin [Bupropion]     Seizure   History reviewed. No pertinent family history.  Social History   Socioeconomic History  . Marital status: Divorced    Spouse name: Not on file  . Number of children: Not on file  . Years of education: Not on file  . Highest education level: Not on file  Occupational History  . Not on file  Tobacco Use  . Smoking status: Former Smoker    Quit date: 10/15/2017    Years since quitting: 2.0  . Smokeless tobacco: Never Used  Substance and Sexual Activity  . Alcohol use: Yes    Comment: occ  . Drug use: No  . Sexual activity: Not on file  Other Topics Concern  . Not on file  Social History Narrative  . Not on file   Social Determinants of Health   Financial Resource Strain:   . Difficulty of Paying Living Expenses:   Food Insecurity:   . Worried About Programme researcher, broadcasting/film/video in the Last Year:   . Barista in the Last Year:   Transportation Needs:   . Freight forwarder (Medical):   Marland Kitchen Lack of Transportation (Non-Medical):   Physical Activity:   . Days of Exercise per Week:   . Minutes of Exercise per Session:   Stress:   . Feeling of Stress :   Social Connections:   . Frequency of Communication with Friends and Family:   . Frequency of Social Gatherings with Friends and Family:   . Attends Religious Services:   . Active Member of Clubs or Organizations:   . Attends Banker Meetings:   Marland Kitchen Marital Status:    Vitals:   11/01/19 1211  BP: 130/70  Pulse: 80  Resp: 12  SpO2: 99%   Body mass index is 36.48 kg/m.  Wt Readings from Last 3 Encounters:  11/01/19 226 lb (102.5 kg)  07/26/19 216 lb (98 kg)  11/13/17 177 lb 7.5 oz (80.5 kg)   Physical Exam  Nursing note and vitals reviewed. Constitutional: She is oriented to person, place, and time. She appears well-developed. No distress.  HENT:  Head: Normocephalic and atraumatic.  Right Ear: Hearing, tympanic membrane, external ear and ear canal normal.    Left Ear: Hearing, tympanic membrane, external ear and ear canal normal.  Mouth/Throat: Uvula is midline, oropharynx is clear and moist and mucous membranes are normal.  Eyes: Pupils are equal, round, and reactive to light. Conjunctivae and EOM are normal.  Neck: No tracheal deviation present. No thyromegaly present.  Cardiovascular: Normal rate and regular rhythm.  No murmur heard. Pulses:      Dorsalis pedis pulses are 2+ on the right side and 2+ on the left side.  Respiratory: Effort normal and breath sounds normal. No respiratory distress.  GI: Soft. She exhibits no mass. There is no hepatomegaly. There is no abdominal tenderness.  Genitourinary:    Genitourinary Comments: Deferred to gyn.   Musculoskeletal:        General: No edema.  Comments: No major deformity or signs of synovitis appreciated.  Lymphadenopathy:    She has no cervical adenopathy.       Right: No supraclavicular adenopathy present.       Left: No supraclavicular adenopathy present.  Neurological: She is alert and oriented to person, place, and time. She has normal strength. No cranial nerve deficit. Coordination and gait normal.  Reflex Scores:      Bicep reflexes are 2+ on the right side and 2+ on the left side.      Patellar reflexes are 2+ on the right side and 2+ on the left side. Skin: Skin is warm. No rash noted. No erythema.  Psychiatric: She has a normal mood and affect. Cognition and memory are normal.  Well groomed, good eye contact.   ASSESSMENT AND PLAN:  Ms. [name] was here today annual physical examination.  Orders Placed This Encounter  Procedures  . Basic metabolic panel  . Lipid panel  . Hemoglobin A1c   Lab Results  Component Value Date   HGBA1C 5.7 11/01/2019   Lab Results  Component Value Date   CREATININE 0.67 11/01/2019   BUN 8 11/01/2019   NA 135 11/01/2019   K 4.1 11/01/2019   CL 100 11/01/2019   CO2 28 11/01/2019   Lab Results  Component Value Date   CHOL  216 (H) 11/01/2019   HDL 76.00 11/01/2019   LDLCALC 119 (H) 11/01/2019   TRIG 101.0 11/01/2019   CHOLHDL 3 11/01/2019   Lab Results  Component Value Date   TSH 1.39 11/01/2019   Lab Results  Component Value Date   ESRSEDRATE 43 (H) 11/01/2019   Lab Results  Component Value Date   CRP <1.0 11/01/2019    Routine general medical examination at a health care facility We discussed the importance of regular physical activity and healthy diet for prevention of chronic illness and/or complications. Preventive guidelines reviewed. Vaccination up to date. Ca++ and vit D supplementation recommended. Next CPE in a year.  The 10-year ASCVD risk score Denman George DC Montez Hageman., et al., 2013) is: 2.2%   Values used to calculate the score:     Age: 22 years     Sex: Female     Is Non-Hispanic African American: No     Diabetic: No     Tobacco smoker: Yes     Systolic Blood Pressure: 130 mmHg     Is BP treated: No     HDL Cholesterol: 76 mg/dL     Total Cholesterol: 216 mg/dL  Numbness of left hand Problem is stable. Possible causes reviewed.Neuro examination and hx do not suggest a serious process. Night time splint.  She agrees with monitoring for now. Instructed about warning signs.  -     Sedimentation rate -     C-reactive protein -     TSH -     Vitamin B12  Wrist pain, acute, left ? OA. Stable. Further recommendations according to lab results.  Hypothyroidism (acquired) No changes in current management, will follow labs done today and will give further recommendations accordingly. -     TSH  Screening for lipoid disorders -     Lipid panel  Screening for endocrine, metabolic and immunity disorder -     Basic metabolic panel -     Hemoglobin A1c   Return in 1 year (on 10/31/2020) for CPE.   Mary Nasby G. [place], MD  [facility]. [place] [facility].   Today you have you routine  preventive visit.  At least 150 minutes of moderate exercise per week, daily brisk  walking for 15-30 min is a good exercise option. Healthy diet low in saturated (animal) fats and sweets and consisting of fresh fruits and vegetables, lean meats such as fish and white chicken and whole grains.  These are some of recommendations for screening depending of age and risk factors:  - Vaccines:  Tdap vaccine every 10 years.  Shingles vaccine recommended at age 32, could be given after 48 years of age but not sure about insurance coverage.   Pneumonia vaccines: Pneumovax at 65. Sometimes Pneumovax is giving earlier if history of smoking, lung disease,diabetes,kidney disease among some.  Screening for diabetes at age 18 and every 3 years.  Cervical cancer prevention:  Pap smear starts at 48 years of age and continues periodically until 48 years old in low risk women. Pap smear every 3 years between 28 and 47 years old. Pap smear every 3-5 years between women 5 and older if pap smear negative and HPV screening negative.   -Breast cancer: Mammogram: There is disagreement between experts about when to start screening in low risk asymptomatic female but recent recommendations are to start screening at 64 and not later than 48 years old , every 1-2 years and after 48 yo q 2 years. Screening is recommended until 48 years old but some women can continue screening depending of healthy issues.  Colon cancer screening: starts at 48 years old until 48 years old.  Cholesterol disorder screening at age 72 and every 3 years.  Also recommended:  1. Dental visit- Brush and floss your teeth twice daily; visit your dentist twice a year. 2. Eye doctor- Get an eye exam at least every 2 years. 3. Helmet use- Always wear a helmet when riding a bicycle, motorcycle, rollerblading or skateboarding. 4. Safe sex- If you may be exposed to sexually transmitted infections, use a condom. 5. Seat belts- Seat belts can save your live; always wear one. 6. Smoke/Carbon Monoxide detectors- These detectors  need to be installed on the appropriate level of your home. Replace batteries at least once a year. 7. Skin cancer- When out in the sun please cover up and use sunscreen 15 SPF or higher. 8. Violence- If anyone is threatening or hurting you, please tell your healthcare provider.  9. Drink alcohol in moderation- Limit alcohol intake to one drink or less per day. Never drink and drive.   Start wearing a wrist splint at night and let me know in numbness gets worse or new symptoms.

## 2019-11-01 NOTE — Patient Instructions (Signed)
Today you have you routine preventive visit.  At least 150 minutes of moderate exercise per week, daily brisk walking for 15-30 min is a good exercise option. Healthy diet low in saturated (animal) fats and sweets and consisting of fresh fruits and vegetables, lean meats such as fish and white chicken and whole grains.  These are some of recommendations for screening depending of age and risk factors:  - Vaccines:  Tdap vaccine every 10 years.  Shingles vaccine recommended at age 85, could be given after 48 years of age but not sure about insurance coverage.   Pneumonia vaccines: Pneumovax at 65. Sometimes Pneumovax is giving earlier if history of smoking, lung disease,diabetes,kidney disease among some.  Screening for diabetes at age 71 and every 3 years.  Cervical cancer prevention:  Pap smear starts at 48 years of age and continues periodically until 48 years old in low risk women. Pap smear every 3 years between 72 and 51 years old. Pap smear every 3-5 years between women 30 and older if pap smear negative and HPV screening negative.   -Breast cancer: Mammogram: There is disagreement between experts about when to start screening in low risk asymptomatic female but recent recommendations are to start screening at 16 and not later than 48 years old , every 1-2 years and after 48 yo q 2 years. Screening is recommended until 48 years old but some women can continue screening depending of healthy issues.  Colon cancer screening: starts at 48 years old until 48 years old.  Cholesterol disorder screening at age 86 and every 3 years.  Also recommended:  1. Dental visit- Brush and floss your teeth twice daily; visit your dentist twice a year. 2. Eye doctor- Get an eye exam at least every 2 years. 3. Helmet use- Always wear a helmet when riding a bicycle, motorcycle, rollerblading or skateboarding. 4. Safe sex- If you may be exposed to sexually transmitted infections, use a  condom. 5. Seat belts- Seat belts can save your live; always wear one. 6. Smoke/Carbon Monoxide detectors- These detectors need to be installed on the appropriate level of your home. Replace batteries at least once a year. 7. Skin cancer- When out in the sun please cover up and use sunscreen 15 SPF or higher. 8. Violence- If anyone is threatening or hurting you, please tell your healthcare provider.  9. Drink alcohol in moderation- Limit alcohol intake to one drink or less per day. Never drink and drive.   Start wearing a wrist splint at night and let me know in numbness gets worse or new symptoms.

## 2019-11-09 ENCOUNTER — Other Ambulatory Visit: Payer: Self-pay

## 2019-11-09 ENCOUNTER — Ambulatory Visit (INDEPENDENT_AMBULATORY_CARE_PROVIDER_SITE_OTHER): Payer: BC Managed Care – PPO | Admitting: *Deleted

## 2019-11-09 DIAGNOSIS — E538 Deficiency of other specified B group vitamins: Secondary | ICD-10-CM | POA: Diagnosis not present

## 2019-11-09 MED ORDER — CYANOCOBALAMIN 1000 MCG/ML IJ SOLN
1000.0000 ug | Freq: Once | INTRAMUSCULAR | Status: AC
  Administered 2019-11-09: 1000 ug via INTRAMUSCULAR

## 2019-11-09 NOTE — Progress Notes (Signed)
Patient in office today for first B-12 injection. Injection administered in Right Deltoid with no immediate reactions.

## 2019-11-16 ENCOUNTER — Other Ambulatory Visit: Payer: Self-pay

## 2019-11-16 ENCOUNTER — Ambulatory Visit: Payer: BC Managed Care – PPO

## 2019-11-17 ENCOUNTER — Ambulatory Visit (INDEPENDENT_AMBULATORY_CARE_PROVIDER_SITE_OTHER): Payer: BC Managed Care – PPO

## 2019-11-17 DIAGNOSIS — E538 Deficiency of other specified B group vitamins: Secondary | ICD-10-CM | POA: Diagnosis not present

## 2019-11-17 MED ORDER — CYANOCOBALAMIN 1000 MCG/ML IJ SOLN
1000.0000 ug | Freq: Once | INTRAMUSCULAR | Status: AC
  Administered 2019-11-17: 14:00:00 1000 ug via INTRAMUSCULAR

## 2019-11-17 NOTE — Progress Notes (Signed)
Per orders from Dr. Betty [place] patient was given a vitamin B12 injection in her left deltoid. Patient tolerated injection well.

## 2019-11-22 ENCOUNTER — Other Ambulatory Visit: Payer: Self-pay

## 2019-11-23 ENCOUNTER — Ambulatory Visit (INDEPENDENT_AMBULATORY_CARE_PROVIDER_SITE_OTHER): Payer: BC Managed Care – PPO | Admitting: *Deleted

## 2019-11-23 DIAGNOSIS — E538 Deficiency of other specified B group vitamins: Secondary | ICD-10-CM

## 2019-11-23 DIAGNOSIS — F413 Other mixed anxiety disorders: Secondary | ICD-10-CM | POA: Diagnosis not present

## 2019-11-23 MED ORDER — CYANOCOBALAMIN 1000 MCG/ML IJ SOLN
1000.0000 ug | Freq: Once | INTRAMUSCULAR | Status: AC
  Administered 2019-11-23: 1000 ug via INTRAMUSCULAR

## 2019-11-23 NOTE — Progress Notes (Signed)
Per orders of Dr. Jordan, injection of B 12 given by Darry Kelnhofer S Girlie Veltri. Patient tolerated injection well. 

## 2019-11-29 ENCOUNTER — Other Ambulatory Visit: Payer: Self-pay

## 2019-11-29 DIAGNOSIS — Z23 Encounter for immunization: Secondary | ICD-10-CM | POA: Diagnosis not present

## 2019-11-30 ENCOUNTER — Ambulatory Visit (INDEPENDENT_AMBULATORY_CARE_PROVIDER_SITE_OTHER): Payer: BC Managed Care – PPO | Admitting: *Deleted

## 2019-11-30 DIAGNOSIS — E538 Deficiency of other specified B group vitamins: Secondary | ICD-10-CM

## 2019-11-30 MED ORDER — CYANOCOBALAMIN 1000 MCG/ML IJ SOLN
1000.0000 ug | Freq: Once | INTRAMUSCULAR | Status: AC
  Administered 2019-11-30: 1000 ug via INTRAMUSCULAR

## 2019-11-30 NOTE — Progress Notes (Signed)
Per orders of Dr. Jordan, injection of B 12 given by Jakyron Fabro S Lunden Stieber. Patient tolerated injection well. 

## 2019-12-02 ENCOUNTER — Other Ambulatory Visit: Payer: Self-pay

## 2019-12-02 ENCOUNTER — Ambulatory Visit (INDEPENDENT_AMBULATORY_CARE_PROVIDER_SITE_OTHER): Payer: BC Managed Care – PPO | Admitting: Family Medicine

## 2019-12-02 ENCOUNTER — Encounter: Payer: Self-pay | Admitting: Family Medicine

## 2019-12-02 VITALS — BP 122/78 | HR 88 | Temp 97.8°F | Resp 12 | Ht 66.0 in | Wt 221.1 lb

## 2019-12-02 DIAGNOSIS — T50B95A Adverse effect of other viral vaccines, initial encounter: Secondary | ICD-10-CM

## 2019-12-02 MED ORDER — TRIAMCINOLONE ACETONIDE 0.1 % EX CREA: 1.0000 "application " | TOPICAL_CREAM | Freq: Two times a day (BID) | CUTANEOUS | 0 refills | Status: AC

## 2019-12-02 NOTE — Progress Notes (Signed)
ACUTE VISIT     Chief Complaint  Patient presents with  . Left arm red, itchy and swollen    patient has 2nd Covid vaccine, reaction to vaccine   HPI: [name] is a 49 y.o. female, who is here today complaining of local erythema and mild edema around site she had her second Moderna vaccine, left arm. She received the vaccine on 11/29/2019, next day she noted mild erythema and soreness. She did not have any problems after her first dose.  Problem is constant. Erythema seems to be extending, edema stable. Negative for limitation of shoulder ROM.  She has not noted fever, chills, change in appetite, sore throat, cough, wheezing, dyspnea,myalgias, joint edema/erythema. Negative for headache,numbness,or focal deficit.  She has not tried OTC medications.  States that she feels "great."  No hx of vaccines reactions in the past.  Review of Systems  Constitutional: Negative for activity change and fatigue.  HENT: Negative for mouth sores, trouble swallowing and voice change.   Eyes: Negative for discharge and redness.  Cardiovascular: Negative for chest pain, palpitations and leg swelling.  Gastrointestinal: Negative for abdominal pain, nausea and vomiting.       No changes in bowel habits.  Musculoskeletal: Negative for gait problem.  Allergic/Immunologic: Negative for environmental allergies.  Rest see pertinent positives and negatives per HPI.  Current Outpatient Medications on File Prior to Visit  Medication Sig Dispense Refill  . Cariprazine HCl 6 MG CAPS Take 6 mg by mouth daily.    . cyclobenzaprine (FLEXERIL) 5 MG tablet Take 1 tablet (5 mg total) by mouth 2 (two) times daily as needed for muscle spasms. 20 tablet 0  . diclofenac Sodium (VOLTAREN) 1 % GEL Apply 4 g topically 4 (four) times daily. 100 g 2  . DULoxetine (CYMBALTA) 60 MG capsule Take 60 mg by mouth 2 (two) times daily.    . JORNAY PM 100 MG CP24 Take 1 capsule by mouth daily.    Marland Kitchen  levothyroxine (SYNTHROID) 25 MCG tablet Take 25 mcg by mouth daily.    Marland Kitchen lidocaine (LIDODERM) 5 % Place 1 patch onto the skin daily. Remove & Discard patch within 12 hours or as directed by MD 30 patch 0  . LORazepam (ATIVAN) 1 MG tablet Take 1 mg by mouth every 6 (six) hours as needed for anxiety.    . methocarbamol (ROBAXIN) 500 MG tablet Take 1 tablet (500 mg total) by mouth 2 (two) times daily as needed for muscle spasms. 30 tablet 1  . Multiple Vitamin (MULTI-VITAMIN DAILY PO) Take by mouth.    . OXcarbazepine (TRILEPTAL) 150 MG tablet Take 300 mg by mouth at bedtime.    Marland Kitchen VRAYLAR capsule Take 3 mg by mouth at bedtime.     No current facility-administered medications on file prior to visit.   Past Medical History:  Diagnosis Date  . Anxiety   . Bipolar disorder (HCC)   . Depression   . History of kidney stones   . Seizures (HCC) ~ 2009   "from taking Wellbutrin"  . Symptomatic bradycardia    Allergies  Allergen Reactions  . Ambien [Zolpidem Tartrate]     Seizure   . Ciprofloxacin Anaphylaxis  . Wellbutrin [Bupropion]     Seizure    Social History   Socioeconomic History  . Marital status: Divorced    Spouse name: Not on file  . Number of children: Not on file  . Years of education: Not on file  .  Highest education level: Not on file  Occupational History  . Not on file  Tobacco Use  . Smoking status: Former Smoker    Quit date: 10/15/2017    Years since quitting: 2.1  . Smokeless tobacco: Never Used  Substance and Sexual Activity  . Alcohol use: Yes    Comment: occ  . Drug use: No  . Sexual activity: Not on file  Other Topics Concern  . Not on file  Social History Narrative  . Not on file   Social Determinants of Health   Financial Resource Strain:   . Difficulty of Paying Living Expenses:   Food Insecurity:   . Worried About Charity fundraiser in the Last Year:   . Arboriculturist in the Last Year:   Transportation Needs:   . Film/video editor  (Medical):   Marland Kitchen Lack of Transportation (Non-Medical):   Physical Activity:   . Days of Exercise per Week:   . Minutes of Exercise per Session:   Stress:   . Feeling of Stress :   Social Connections:   . Frequency of Communication with Friends and Family:   . Frequency of Social Gatherings with Friends and Family:   . Attends Religious Services:   . Active Member of Clubs or Organizations:   . Attends Archivist Meetings:   Marland Kitchen Marital Status:     Vitals:   12/02/19 1503  BP: 122/78  Pulse: 88  Resp: 12  Temp: 97.8 F (36.6 C)  SpO2: 98%   Body mass index is 35.69 kg/m.  Physical Exam  Nursing note and vitals reviewed. Constitutional: She is oriented to person, place, and time. She appears well-developed. No distress.  HENT:  Head: Normocephalic and atraumatic.  Eyes: Conjunctivae are normal.  Cardiovascular: Normal rate and regular rhythm.  No murmur heard. Respiratory: Effort normal and breath sounds normal. No respiratory distress.  Musculoskeletal:        General: No edema.     Left shoulder: No tenderness or bony tenderness. Normal range of motion.     Left elbow: No swelling. Normal range of motion.  Lymphadenopathy:    She has no cervical adenopathy.       Left: No supraclavicular and no epitrochlear adenopathy present.  Neurological: She is alert and oriented to person, place, and time. She has normal strength. Gait normal.  Skin: Skin is warm. No rash noted. There is erythema.  Left arm with light erythematous area, demarcated   borders, about 10 cm diameter. Mild edema. No induration or fluctuant area identified. Mild soreness upon pressing area.  Small ecchymosis medially, no tender, 2 cm. See picture.  Psychiatric: She has a normal mood and affect.  Well groomed, good eye contact.      ASSESSMENT AND PLAN:  [name] was seen today for left arm red, itchy and swollen.  Diagnoses and all orders for this visit:  Local reaction to  influenza vaccine, initial encounter -     triamcinolone cream (KENALOG) 0.1 %; Apply 1 application topically 2 (two) times daily for 14 days.   We discussed differential diagnosis, including cellulitis. For now I do not think oral antibiotic is needed. Similar skin reactions have been reported a few days after first dose of Moderna vaccine (not 2nd one), usually self limited. Local ice.  Topical steroid may help with pruritus. Monitor for induration,worsening pain,or systemic symptoms.  She was clearly instructed about warning signs. She voices understanding and agrees with plan.  Return if symptoms worsen or fail to improve.   Analysse Quinonez G. [place], MD  [facility]. [place] office.    A few things to remember from today's visit:   Local reaction to influenza vaccine, initial encounter - Plan: triamcinolone cream (KENALOG) 0.1 %  I do not think antibiotic is needed at this time, it seems to be a local reaction. Monitor for changes or pain.  Post-Injection Inflammatory Reaction A post-injection inflammatory reaction is swelling, irritation, and other problems that can develop after a person gets a shot (injection). The reaction can develop at and around the injection site or far away from the injection site. In some cases, it can develop right away and last for a short time. In other cases, it can develop weeks after the injection and last for several hours or days. In most cases, the reaction is not serious and will go away on its own. What are the causes? This condition may be caused by:  An allergy to the needle or medicine.  A response by your body's defense system (immune system).  Damage to the surrounding tissue from the injection.  Germs that enter the body through the injection site. What are the signs or symptoms? Common symptoms of this condition include:  Itchiness.  Redness.  Warmth.  Rash.  Swelling.  Tenderness.  Pain. Other symptoms  include:  Drainage.  Fever or chills.  Muscle aches.  Nausea or vomiting.  Loss of appetite.  Headache.  Dizziness. In severe cases, seizures, asthma, or a severe allergic reaction (anaphylaxis) can occur. These cases are rare. How is this diagnosed? This condition may be diagnosed with a physical exam. During the exam, a circle may be drawn around the injection site. The circle helps to show whether redness in the area is spreading. How is this treated? Treatment for this condition depends on what caused the reaction and how severe the reaction is. Treatment may include:  Putting an ice pack over the injection site.  Taking a nonsteroidal anti-inflammatory drug (NSAID) to lessen swelling and itching.  Taking antibiotic medicine.  Taking over-the-counter pain medicine. If the reaction affects a joint, you may also need to rest the joint for a while. Follow these instructions at home: Medicines  If you were prescribed antibiotic medicine, take or apply it as told by your health care provider. Do not stop taking or applying the antibiotic even if you start to feel better.  Take over-the-counter and prescription medicines only as told by your health care provider.  Ask your health care provider if the medicine prescribed to you requires you to avoid driving or using heavy machinery. Managing pain and swelling  If directed, put ice on the affected area: ? Put ice in a plastic bag. ? Place a towel between your skin and the bag. ? Leave the ice on for 20 minutes, 2-3 times a day. General instructions  If the reaction affects a joint, rest your joint. Ask your health care provider when you can start to use your joint again.  Keep the injection site clean.  Keep all follow-up visits as told by your health care provider. This is important. Contact a health care provider if:  Your symptoms last for several hours.  You have a fever or chills.  You have muscle aches. Get  help right away if:  Your symptoms get worse.  You have trouble breathing.  You have a rash, raised bumps (hives), or severe itching.  You have seizures. Summary  A post-injection inflammatory reaction is swelling, irritation, and other problems that can develop after a person gets a shot (injection).  It may be caused by an allergy, an immune response, damage to the tissues surrounding the injection, or an infection.  This condition may be treated by icing the area and by taking medicine. This information is not intended to replace advice given to you by your health care provider. Make sure you discuss any questions you have with your health care provider. Document Revised: 11/26/2018 Document Reviewed: 07/12/2018 Elsevier Patient Education  2020 [employer].  Please be sure medication list is accurate. If a new problem present, please set up appointment sooner than planned today.

## 2019-12-02 NOTE — Patient Instructions (Addendum)
A few things to remember from today's visit:   Local reaction to influenza vaccine, initial encounter - Plan: triamcinolone cream (KENALOG) 0.1 %  I do not think antibiotic is needed at this time, it seems to be a local reaction. Monitor for changes or pain.  Post-Injection Inflammatory Reaction A post-injection inflammatory reaction is swelling, irritation, and other problems that can develop after a person gets a shot (injection). The reaction can develop at and around the injection site or far away from the injection site. In some cases, it can develop right away and last for a short time. In other cases, it can develop weeks after the injection and last for several hours or days. In most cases, the reaction is not serious and will go away on its own. What are the causes? This condition may be caused by:  An allergy to the needle or medicine.  A response by your body's defense system (immune system).  Damage to the surrounding tissue from the injection.  Germs that enter the body through the injection site. What are the signs or symptoms? Common symptoms of this condition include:  Itchiness.  Redness.  Warmth.  Rash.  Swelling.  Tenderness.  Pain. Other symptoms include:  Drainage.  Fever or chills.  Muscle aches.  Nausea or vomiting.  Loss of appetite.  Headache.  Dizziness. In severe cases, seizures, asthma, or a severe allergic reaction (anaphylaxis) can occur. These cases are rare. How is this diagnosed? This condition may be diagnosed with a physical exam. During the exam, a circle may be drawn around the injection site. The circle helps to show whether redness in the area is spreading. How is this treated? Treatment for this condition depends on what caused the reaction and how severe the reaction is. Treatment may include:  Putting an ice pack over the injection site.  Taking a nonsteroidal anti-inflammatory drug (NSAID) to lessen swelling and  itching.  Taking antibiotic medicine.  Taking over-the-counter pain medicine. If the reaction affects a joint, you may also need to rest the joint for a while. Follow these instructions at home: Medicines  If you were prescribed antibiotic medicine, take or apply it as told by your health care provider. Do not stop taking or applying the antibiotic even if you start to feel better.  Take over-the-counter and prescription medicines only as told by your health care provider.  Ask your health care provider if the medicine prescribed to you requires you to avoid driving or using heavy machinery. Managing pain and swelling  If directed, put ice on the affected area: ? Put ice in a plastic bag. ? Place a towel between your skin and the bag. ? Leave the ice on for 20 minutes, 2-3 times a day. General instructions  If the reaction affects a joint, rest your joint. Ask your health care provider when you can start to use your joint again.  Keep the injection site clean.  Keep all follow-up visits as told by your health care provider. This is important. Contact a health care provider if:  Your symptoms last for several hours.  You have a fever or chills.  You have muscle aches. Get help right away if:  Your symptoms get worse.  You have trouble breathing.  You have a rash, raised bumps (hives), or severe itching.  You have seizures. Summary  A post-injection inflammatory reaction is swelling, irritation, and other problems that can develop after a person gets a shot (injection).  It may be  caused by an allergy, an immune response, damage to the tissues surrounding the injection, or an infection.  This condition may be treated by icing the area and by taking medicine. This information is not intended to replace advice given to you by your health care provider. Make sure you discuss any questions you have with your health care provider. Document Revised: 11/26/2018 Document  Reviewed: 07/12/2018 Elsevier Patient Education  [place].  Please be sure medication list is accurate. If a new problem present, please set up appointment sooner than planned today.

## 2019-12-07 DIAGNOSIS — F413 Other mixed anxiety disorders: Secondary | ICD-10-CM | POA: Diagnosis not present

## 2019-12-15 DIAGNOSIS — F413 Other mixed anxiety disorders: Secondary | ICD-10-CM | POA: Diagnosis not present

## 2019-12-23 ENCOUNTER — Other Ambulatory Visit: Payer: Self-pay

## 2019-12-26 ENCOUNTER — Ambulatory Visit (INDEPENDENT_AMBULATORY_CARE_PROVIDER_SITE_OTHER): Payer: BC Managed Care – PPO | Admitting: *Deleted

## 2019-12-26 DIAGNOSIS — E538 Deficiency of other specified B group vitamins: Secondary | ICD-10-CM | POA: Diagnosis not present

## 2019-12-26 MED ORDER — CYANOCOBALAMIN 1000 MCG/ML IJ SOLN
1000.0000 ug | Freq: Once | INTRAMUSCULAR | Status: AC
  Administered 2019-12-26: 1000 ug via INTRAMUSCULAR

## 2019-12-26 NOTE — Progress Notes (Signed)
Per orders of Dr. Jordan, injection of Vitamin B 12 given by Navjot Loera S Keelin Sheridan. Patient tolerated injection well. 

## 2020-01-27 ENCOUNTER — Other Ambulatory Visit: Payer: Self-pay

## 2020-01-30 ENCOUNTER — Ambulatory Visit (INDEPENDENT_AMBULATORY_CARE_PROVIDER_SITE_OTHER): Payer: BC Managed Care – PPO | Admitting: *Deleted

## 2020-01-30 ENCOUNTER — Other Ambulatory Visit: Payer: Self-pay

## 2020-01-30 DIAGNOSIS — E538 Deficiency of other specified B group vitamins: Secondary | ICD-10-CM

## 2020-01-30 MED ORDER — CYANOCOBALAMIN 1000 MCG/ML IJ SOLN
1000.0000 ug | Freq: Once | INTRAMUSCULAR | Status: AC
  Administered 2020-01-30: 1000 ug via INTRAMUSCULAR

## 2020-01-30 NOTE — Progress Notes (Signed)
Per orders of Dr. Jordan, injection of Vitamin B 12 given by Kelia Gibbon S Renuka Farfan. Patient tolerated injection well. 

## 2020-02-02 DIAGNOSIS — Z01419 Encounter for gynecological examination (general) (routine) without abnormal findings: Secondary | ICD-10-CM | POA: Diagnosis not present

## 2020-02-02 DIAGNOSIS — Z6835 Body mass index (BMI) 35.0-35.9, adult: Secondary | ICD-10-CM | POA: Diagnosis not present

## 2020-02-02 DIAGNOSIS — Z1231 Encounter for screening mammogram for malignant neoplasm of breast: Secondary | ICD-10-CM | POA: Diagnosis not present

## 2020-02-22 ENCOUNTER — Other Ambulatory Visit: Payer: Self-pay

## 2020-02-22 ENCOUNTER — Encounter: Payer: Self-pay | Admitting: Family Medicine

## 2020-02-22 ENCOUNTER — Ambulatory Visit (INDEPENDENT_AMBULATORY_CARE_PROVIDER_SITE_OTHER): Payer: BC Managed Care – PPO | Admitting: Family Medicine

## 2020-02-22 VITALS — BP 126/80 | HR 87 | Temp 98.3°F | Resp 12 | Ht 66.0 in | Wt 222.0 lb

## 2020-02-22 DIAGNOSIS — E538 Deficiency of other specified B group vitamins: Secondary | ICD-10-CM | POA: Diagnosis not present

## 2020-02-22 DIAGNOSIS — R200 Anesthesia of skin: Secondary | ICD-10-CM | POA: Diagnosis not present

## 2020-02-22 MED ORDER — CYANOCOBALAMIN 1000 MCG/ML IJ SOLN
1000.0000 ug | Freq: Once | INTRAMUSCULAR | Status: AC
  Administered 2020-02-22: 1000 ug via INTRAMUSCULAR

## 2020-02-22 NOTE — Patient Instructions (Addendum)
A few things to remember from today's visit:   B12 deficiency - Plan: Vitamin B12  Today last B12 injection. Based on B12 results we will make a plan for future b12 supplementation.  If you need refills please call your pharmacy. Do not use My Chart to request refills or for acute issues that need immediate attention.    Please be sure medication list is accurate. If a new problem present, please set up appointment sooner than planned today.

## 2020-02-22 NOTE — Progress Notes (Addendum)
HPI: [name] is a 49 y.o. female, who is here today for follow up.   She was last seen on 12/02/19. On 10/24/19 she was c/o left hand constant numbness.  Work-up otherwise negative except for B12.  Lab Results  Component Value Date   VITAMINB12 157 (L) 11/01/2019   Lab Results  Component Value Date   CRP <1.0 11/01/2019   Lab Results  Component Value Date   TSH 1.39 11/01/2019   Lab Results  Component Value Date   CREATININE 0.67 11/01/2019   BUN 8 11/01/2019   NA 135 11/01/2019   K 4.1 11/01/2019   CL 100 11/01/2019   CO2 28 11/01/2019   Lab Results  Component Value Date   WBC 6.4 11/13/2017   HGB 12.6 11/13/2017   HCT 37.0 11/13/2017   MCV 90.2 11/13/2017   PLT 265 11/13/2017   She has had B12 1000 mcg IM weekly. Left hand numbness has resolved.  Negative for fever,chills,abnormal wt loss.or skin rash.  Review of Systems  Constitutional: Negative for activity change and appetite change.  HENT: Negative for mouth sores, nosebleeds and sore throat.   Musculoskeletal: Negative for gait problem and myalgias.  Neurological: Negative for syncope, facial asymmetry, weakness and headaches.  Rest of ROS, see pertinent positives sand negatives in HPI  Current Outpatient Medications on File Prior to Visit  Medication Sig Dispense Refill  . Cariprazine HCl 6 MG CAPS Take 6 mg by mouth daily.    . cyclobenzaprine (FLEXERIL) 5 MG tablet Take 1 tablet (5 mg total) by mouth 2 (two) times daily as needed for muscle spasms. 20 tablet 0  . diclofenac Sodium (VOLTAREN) 1 % GEL Apply 4 g topically 4 (four) times daily. 100 g 2  . DULoxetine (CYMBALTA) 60 MG capsule Take 60 mg by mouth 2 (two) times daily.    . JORNAY PM 100 MG CP24 Take 1 capsule by mouth daily.    Marland Kitchen levothyroxine (SYNTHROID) 25 MCG tablet Take 25 mcg by mouth daily.    Marland Kitchen lidocaine (LIDODERM) 5 % Place 1 patch onto the skin daily. Remove & Discard patch within 12 hours or as directed by MD 30  patch 0  . LORazepam (ATIVAN) 1 MG tablet Take 1 mg by mouth every 6 (six) hours as needed for anxiety.    . methocarbamol (ROBAXIN) 500 MG tablet Take 1 tablet (500 mg total) by mouth 2 (two) times daily as needed for muscle spasms. 30 tablet 1  . Multiple Vitamin (MULTI-VITAMIN DAILY PO) Take by mouth.    . OXcarbazepine (TRILEPTAL) 150 MG tablet Take 300 mg by mouth at bedtime.    Marland Kitchen VRAYLAR capsule Take 3 mg by mouth at bedtime.     No current facility-administered medications on file prior to visit.   Past Medical History:  Diagnosis Date  . Anxiety   . Bipolar disorder (HCC)   . Depression   . History of kidney stones   . Seizures (HCC) ~ 2009   "from taking Wellbutrin"  . Symptomatic bradycardia    Allergies  Allergen Reactions  . Ambien [Zolpidem Tartrate]     Seizure   . Ciprofloxacin Anaphylaxis  . Wellbutrin [Bupropion]     Seizure    Social History   Socioeconomic History  . Marital status: Divorced    Spouse name: Not on file  . Number of children: Not on file  . Years of education: Not on file  . Highest education level: Not  on file  Occupational History  . Not on file  Tobacco Use  . Smoking status: Former Smoker    Quit date: 10/15/2017    Years since quitting: 2.3  . Smokeless tobacco: Never Used  Vaping Use  . Vaping Use: Never used  Substance and Sexual Activity  . Alcohol use: Yes    Comment: occ  . Drug use: No  . Sexual activity: Not on file  Other Topics Concern  . Not on file  Social History Narrative  . Not on file   Social Determinants of Health   Financial Resource Strain:   . Difficulty of Paying Living Expenses:   Food Insecurity:   . Worried About Programme researcher, broadcasting/film/video in the Last Year:   . Barista in the Last Year:   Transportation Needs:   . Freight forwarder (Medical):   Marland Kitchen Lack of Transportation (Non-Medical):   Physical Activity:   . Days of Exercise per Week:   . Minutes of Exercise per Session:   Stress:    . Feeling of Stress :   Social Connections:   . Frequency of Communication with Friends and Family:   . Frequency of Social Gatherings with Friends and Family:   . Attends Religious Services:   . Active Member of Clubs or Organizations:   . Attends Banker Meetings:   Marland Kitchen Marital Status:     Vitals:   02/22/20 1553  BP: 126/80  Pulse: 87  Resp: 12  Temp: 98.3 F (36.8 C)  SpO2: 98%   Body mass index is 35.83 kg/m.  Physical Exam Vitals and nursing note reviewed.  Constitutional:      General: She is not in acute distress.    Appearance: She is well-developed.  HENT:     Head: Normocephalic and atraumatic.  Eyes:     Conjunctiva/sclera: Conjunctivae normal.  Cardiovascular:     Rate and Rhythm: Normal rate and regular rhythm.     Heart sounds: No murmur heard.      Comments: Trace LE pitting edema,bilateral Pulmonary:     Effort: Pulmonary effort is normal. No respiratory distress.     Breath sounds: Normal breath sounds.  Abdominal:     Palpations: Abdomen is soft. There is no hepatomegaly or mass.     Tenderness: There is no abdominal tenderness.  Skin:    General: Skin is warm.     Findings: No erythema or rash.  Neurological:     Mental Status: She is alert and oriented to person, place, and time.     Cranial Nerves: No cranial nerve deficit.     Gait: Gait normal.     Deep Tendon Reflexes:     Reflex Scores:      Bicep reflexes are 2+ on the right side and 2+ on the left side.      Patellar reflexes are 2+ on the right side and 2+ on the left side. Psychiatric:     Comments: Well groomed, good eye contact.    ASSESSMENT AND PLAN:  Ms. [name] was seen today for follow-up.  Orders Placed This Encounter  Procedures  . Vitamin B12    1. B12 deficiency Today we do not have lab service. Today B12 1000 mcg Im x 1. Lab appt will be arranged. We will plan how to continue B12 supplementation accoridng to B12 result.  -  cyanocobalamin ((VITAMIN B-12)) injection 1,000 mcg  2. Numbness of left hand Resolved.  Return in about 9 months (around 11/08/2020) for cpe.   Donnell Wion G. [place], MD  [facility]. [place] office.

## 2020-02-24 ENCOUNTER — Other Ambulatory Visit: Payer: BC Managed Care – PPO

## 2020-02-27 ENCOUNTER — Other Ambulatory Visit: Payer: BC Managed Care – PPO

## 2020-03-29 ENCOUNTER — Other Ambulatory Visit: Payer: Self-pay

## 2020-03-29 ENCOUNTER — Other Ambulatory Visit: Payer: Self-pay | Admitting: *Deleted

## 2020-03-29 ENCOUNTER — Other Ambulatory Visit (INDEPENDENT_AMBULATORY_CARE_PROVIDER_SITE_OTHER): Payer: BC Managed Care – PPO

## 2020-03-29 DIAGNOSIS — E538 Deficiency of other specified B group vitamins: Secondary | ICD-10-CM | POA: Diagnosis not present

## 2020-03-29 DIAGNOSIS — F413 Other mixed anxiety disorders: Secondary | ICD-10-CM | POA: Diagnosis not present

## 2020-03-30 LAB — VITAMIN B12: Vitamin B-12: 348 pg/mL (ref 200–1100)

## 2020-04-02 NOTE — Addendum Note (Signed)
Addended by: [place], Seldon Barrell G on: 04/02/2020 05:31 PM   Modules accepted: Orders

## 2020-04-06 ENCOUNTER — Other Ambulatory Visit: Payer: Self-pay

## 2020-04-06 ENCOUNTER — Ambulatory Visit (INDEPENDENT_AMBULATORY_CARE_PROVIDER_SITE_OTHER): Payer: BC Managed Care – PPO

## 2020-04-06 DIAGNOSIS — E538 Deficiency of other specified B group vitamins: Secondary | ICD-10-CM | POA: Diagnosis not present

## 2020-04-06 MED ORDER — CYANOCOBALAMIN 1000 MCG/ML IJ SOLN
1000.0000 ug | Freq: Once | INTRAMUSCULAR | Status: AC
  Administered 2020-04-06: 1000 ug via INTRAMUSCULAR

## 2020-04-06 NOTE — Progress Notes (Signed)
Pt came into the office for her B12 injection. Injection given in the left deltoid. Pt tolerated injection well.

## 2020-05-07 ENCOUNTER — Ambulatory Visit (INDEPENDENT_AMBULATORY_CARE_PROVIDER_SITE_OTHER): Payer: BC Managed Care – PPO

## 2020-05-07 ENCOUNTER — Other Ambulatory Visit: Payer: Self-pay

## 2020-05-07 DIAGNOSIS — E538 Deficiency of other specified B group vitamins: Secondary | ICD-10-CM

## 2020-05-07 MED ORDER — CYANOCOBALAMIN 1000 MCG/ML IJ SOLN
1000.0000 ug | Freq: Once | INTRAMUSCULAR | Status: AC
  Administered 2020-05-07: 1000 ug via INTRAMUSCULAR

## 2020-05-07 NOTE — Progress Notes (Signed)
Per orders of Dr Jordan , injection of Cyanocobalamin 1,000 mcg/mL given by Keyuana Wank N Britanee Vanblarcom. Patient tolerated injection well.  

## 2020-05-16 DIAGNOSIS — F3178 Bipolar disorder, in full remission, most recent episode mixed: Secondary | ICD-10-CM | POA: Diagnosis not present

## 2020-06-04 ENCOUNTER — Ambulatory Visit (INDEPENDENT_AMBULATORY_CARE_PROVIDER_SITE_OTHER): Payer: BC Managed Care – PPO | Admitting: *Deleted

## 2020-06-04 ENCOUNTER — Other Ambulatory Visit: Payer: Self-pay

## 2020-06-04 DIAGNOSIS — E538 Deficiency of other specified B group vitamins: Secondary | ICD-10-CM

## 2020-06-04 MED ORDER — CYANOCOBALAMIN 1000 MCG/ML IJ SOLN
1000.0000 ug | Freq: Once | INTRAMUSCULAR | Status: AC
  Administered 2020-06-04: 1000 ug via INTRAMUSCULAR

## 2020-06-04 NOTE — Progress Notes (Addendum)
After obtaining consent, and per orders of Dr. [place], injection of B-12 1k mcg/76mL  Given at 3:55pm in her left deltoid by Virgil Lightner. Jule Ser. Patient instructed to remain in clinic for 20 minutes afterwards, and to report any adverse reaction to me immediately. Betty [place], MD

## 2020-07-11 ENCOUNTER — Other Ambulatory Visit: Payer: Self-pay

## 2020-07-11 ENCOUNTER — Ambulatory Visit (INDEPENDENT_AMBULATORY_CARE_PROVIDER_SITE_OTHER): Payer: BC Managed Care – PPO

## 2020-07-11 DIAGNOSIS — E538 Deficiency of other specified B group vitamins: Secondary | ICD-10-CM | POA: Diagnosis not present

## 2020-07-11 MED ORDER — CYANOCOBALAMIN 1000 MCG/ML IJ SOLN
1000.0000 ug | Freq: Once | INTRAMUSCULAR | Status: AC
  Administered 2020-07-11: 1000 ug via INTRAMUSCULAR

## 2020-07-11 NOTE — Progress Notes (Signed)
Pt came in for her B12 injection. Injection given in the right deltoid, pt tolerated injection well. No signs/symptoms of a reaction prior to leaving office.

## 2020-08-06 ENCOUNTER — Other Ambulatory Visit: Payer: Self-pay

## 2020-08-06 ENCOUNTER — Ambulatory Visit (INDEPENDENT_AMBULATORY_CARE_PROVIDER_SITE_OTHER): Payer: BC Managed Care – PPO

## 2020-08-06 DIAGNOSIS — E538 Deficiency of other specified B group vitamins: Secondary | ICD-10-CM

## 2020-08-06 MED ORDER — CYANOCOBALAMIN 1000 MCG/ML IJ SOLN
1000.0000 ug | Freq: Once | INTRAMUSCULAR | Status: AC
  Administered 2020-08-06: 1000 ug via INTRAMUSCULAR

## 2020-08-06 NOTE — Progress Notes (Addendum)
Pt came in for her B12 injection. Injection given in the left deltoid, pt tolerated well. Pt will have B12 lab drawn before her next injection.  Betty [place], MD

## 2020-08-08 ENCOUNTER — Other Ambulatory Visit: Payer: BC Managed Care – PPO

## 2020-08-08 DIAGNOSIS — Z20822 Contact with and (suspected) exposure to covid-19: Secondary | ICD-10-CM | POA: Diagnosis not present

## 2020-08-09 LAB — SARS-COV-2, NAA 2 DAY TAT

## 2020-08-09 LAB — NOVEL CORONAVIRUS, NAA: SARS-CoV-2, NAA: NOT DETECTED

## 2020-08-13 ENCOUNTER — Encounter: Payer: Self-pay | Admitting: Family Medicine

## 2020-08-13 ENCOUNTER — Telehealth (INDEPENDENT_AMBULATORY_CARE_PROVIDER_SITE_OTHER): Payer: BC Managed Care – PPO | Admitting: Family Medicine

## 2020-08-13 DIAGNOSIS — J069 Acute upper respiratory infection, unspecified: Secondary | ICD-10-CM | POA: Diagnosis not present

## 2020-08-13 NOTE — Progress Notes (Signed)
Subjective:    Patient ID: Christe Tellez, female    DOB: [birth year], 49 y.o.   MRN: [id number]  HPI Here for one week of chest congestion and some wheezing when she lies flat on her back. She was coughing at first but not now. No fever or SOB. Using Mucinex and Robitussin. She tested negative for the Covid virus 5 days ago.  Virtual Visit via Video Note  I connected with the patient on 08/13/20 at  9:30 AM EST by a video enabled telemedicine application and verified that I am speaking with the correct person using two identifiers.  Location patient: home Location provider:work or home office Persons participating in the virtual visit: patient, provider  I discussed the limitations of evaluation and management by telemedicine and the availability of in person appointments. The patient expressed understanding and agreed to proceed.   HPI:    ROS: See pertinent positives and negatives per HPI.  Past Medical History:  Diagnosis Date   Anxiety    Bipolar disorder (HCC)    Depression    History of kidney stones    Seizures (HCC) ~ 2009   "from taking Wellbutrin"   Symptomatic bradycardia     Past Surgical History:  Procedure Laterality Date   COLONOSCOPY  05/2001   /notes 06/04/2001   ESOPHAGOGASTRODUODENOSCOPY  11/2002   Hattie Perch 12/05/2002   EYE SURGERY     LAPAROSCOPIC CHOLECYSTECTOMY  12/2002   LITHOTRIPSY  X 1   STRABISMUS SURGERY  1975; 1997   "both eyes; right eye"   TONSILLECTOMY  1980    History reviewed. No pertinent family history.   Current Outpatient Medications:    Cariprazine HCl 6 MG CAPS, Take 6 mg by mouth daily., Disp: , Rfl:    cyclobenzaprine (FLEXERIL) 5 MG tablet, Take 1 tablet (5 mg total) by mouth 2 (two) times daily as needed for muscle spasms., Disp: 20 tablet, Rfl: 0   diclofenac Sodium (VOLTAREN) 1 % GEL, Apply 4 g topically 4 (four) times daily., Disp: 100 g, Rfl: 2   DULoxetine (CYMBALTA) 60 MG capsule, Take 60 mg by  mouth 2 (two) times daily., Disp: , Rfl:    JORNAY PM 100 MG CP24, Take 1 capsule by mouth daily., Disp: , Rfl:    levothyroxine (SYNTHROID) 25 MCG tablet, Take 25 mcg by mouth daily., Disp: , Rfl:    lidocaine (LIDODERM) 5 %, Place 1 patch onto the skin daily. Remove & Discard patch within 12 hours or as directed by MD, Disp: 30 patch, Rfl: 0   LORazepam (ATIVAN) 1 MG tablet, Take 1 mg by mouth every 6 (six) hours as needed for anxiety., Disp: , Rfl:    methocarbamol (ROBAXIN) 500 MG tablet, Take 1 tablet (500 mg total) by mouth 2 (two) times daily as needed for muscle spasms., Disp: 30 tablet, Rfl: 1   Multiple Vitamin (MULTI-VITAMIN DAILY PO), Take by mouth., Disp: , Rfl:    OXcarbazepine (TRILEPTAL) 150 MG tablet, Take 300 mg by mouth at bedtime., Disp: , Rfl:    VRAYLAR capsule, Take 3 mg by mouth at bedtime., Disp: , Rfl:   EXAM:  VITALS per patient if applicable:  GENERAL: alert, oriented, appears well and in no acute distress  HEENT: atraumatic, conjunttiva clear, no obvious abnormalities on inspection of external nose and ears  NECK: normal movements of the head and neck  LUNGS: on inspection no signs of respiratory distress, breathing rate appears normal, no obvious gross SOB, gasping or wheezing  CV: no obvious cyanosis  MS: moves all visible extremities without noticeable abnormality  PSYCH/NEURO: pleasant and cooperative, no obvious depression or anxiety, speech and thought processing grossly intact  ASSESSMENT AND PLAN: This sounds like a viral URI. I reassured her that this should run its course in a few days. Stay on Mucinex. Recheck as needed.  Gershon Crane, MD  Discussed the following assessment and plan:  No diagnosis found.     I discussed the assessment and treatment plan with the patient. The patient was provided an opportunity to ask questions and all were answered. The patient agreed with the plan and demonstrated an understanding of the  instructions.   The patient was advised to call back or seek an in-person evaluation if the symptoms worsen or if the condition fails to improve as anticipated.     Review of Systems     Objective:   Physical Exam        Assessment & Plan:

## 2020-08-15 NOTE — Addendum Note (Signed)
Addended by: Debbi Strandberg. M on: 08/15/2020 07:09 AM   Modules accepted: Orders  

## 2020-08-20 ENCOUNTER — Other Ambulatory Visit: Payer: BC Managed Care – PPO

## 2020-09-17 ENCOUNTER — Ambulatory Visit (INDEPENDENT_AMBULATORY_CARE_PROVIDER_SITE_OTHER): Payer: BC Managed Care – PPO

## 2020-09-17 ENCOUNTER — Ambulatory Visit: Payer: BC Managed Care – PPO | Admitting: Family Medicine

## 2020-09-17 ENCOUNTER — Other Ambulatory Visit: Payer: Self-pay

## 2020-09-17 ENCOUNTER — Encounter: Payer: Self-pay | Admitting: Family Medicine

## 2020-09-17 VITALS — BP 128/80 | HR 72 | Resp 16 | Ht 66.0 in | Wt 241.0 lb

## 2020-09-17 DIAGNOSIS — M545 Low back pain, unspecified: Secondary | ICD-10-CM | POA: Diagnosis not present

## 2020-09-17 DIAGNOSIS — Z6838 Body mass index (BMI) 38.0-38.9, adult: Secondary | ICD-10-CM | POA: Diagnosis not present

## 2020-09-17 DIAGNOSIS — E669 Obesity, unspecified: Secondary | ICD-10-CM | POA: Insufficient documentation

## 2020-09-17 DIAGNOSIS — G8929 Other chronic pain: Secondary | ICD-10-CM

## 2020-09-17 DIAGNOSIS — E66812 Obesity, class 2: Secondary | ICD-10-CM | POA: Insufficient documentation

## 2020-09-17 DIAGNOSIS — Z6837 Body mass index (BMI) 37.0-37.9, adult: Secondary | ICD-10-CM | POA: Insufficient documentation

## 2020-09-17 MED ORDER — CELECOXIB 100 MG PO CAPS: 100.0000 mg | ORAL_CAPSULE | Freq: Two times a day (BID) | ORAL | 1 refills | Status: DC

## 2020-09-17 MED ORDER — METHOCARBAMOL 500 MG PO TABS: 500.0000 mg | ORAL_TABLET | Freq: Two times a day (BID) | ORAL | 1 refills | Status: DC | PRN

## 2020-09-17 MED ORDER — PREDNISONE 20 MG PO TABS: ORAL_TABLET | ORAL | 0 refills | Status: AC

## 2020-09-17 NOTE — Progress Notes (Signed)
Chief Complaint  Patient presents with  . Back Pain    Had a fall at work 3-4 years ago. Right sided back pain around hip area.    HPI: [name] is a 50 y.o. female, who is here today with her wife with above complain. She has had intermittent low back pain for years, started after a fall at work. Pain has been constant for a week, dull, 5-6/10. Intermittent sharp pain, 8/10. Pain is sometimes radiated to RLE, mainly when sitting for long time. Negative for recent injury or unusual level of activity.  Negative for associated LE numbness, tingling, urinary incontinence or retention, stool incontinence, or saddle anesthesia.  Exacerbated by long standing and sitting. Alleviated by changing positions. No rash or edema on area, fever, chills, or abnormal wt loss.  OTC medications: Ibuprofen and Tylenol.  She is on Cymbalta 90 mg , prescribed by her psychiatrist for depression.  She has gained some wt in the past few months, she thinks this has aggravated her back pain. She is not exercising regularly.  Review of Systems  Constitutional: Negative for activity change, appetite change and fatigue.  HENT: Negative for mouth sores and sore throat.   Respiratory: Negative for cough, shortness of breath and wheezing.   Gastrointestinal: Negative for abdominal pain, nausea and vomiting.       Negative for changes in bowel habits.  Genitourinary: Negative for decreased urine volume, dysuria and hematuria.  Musculoskeletal: Negative for gait problem.  Rest see pertinent positives and negatives per HPI.  Current Outpatient Medications on File Prior to Visit  Medication Sig Dispense Refill  . Cariprazine HCl 6 MG CAPS Take 6 mg by mouth daily.    . DULoxetine (CYMBALTA) 60 MG capsule Take 60 mg by mouth 2 (two) times daily.    . JORNAY PM 100 MG CP24 Take 1 capsule by mouth daily.    Marland Kitchen levothyroxine (SYNTHROID) 25 MCG tablet Take 25 mcg by mouth daily.    Marland Kitchen LORazepam (ATIVAN)  1 MG tablet Take 1 mg by mouth every 6 (six) hours as needed for anxiety.    . Multiple Vitamin (MULTI-VITAMIN DAILY PO) Take by mouth.    . OXcarbazepine (TRILEPTAL) 150 MG tablet Take 300 mg by mouth at bedtime.    Marland Kitchen VRAYLAR capsule Take 3 mg by mouth at bedtime.     No current facility-administered medications on file prior to visit.   Past Medical History:  Diagnosis Date  . Anxiety   . Bipolar disorder (HCC)   . Depression   . History of kidney stones   . Seizures (HCC) ~ 2009   "from taking Wellbutrin"  . Symptomatic bradycardia    Allergies  Allergen Reactions  . Ambien [Zolpidem Tartrate]     Seizure   . Ciprofloxacin Anaphylaxis  . Wellbutrin [Bupropion]     Seizure    Social History   Socioeconomic History  . Marital status: Married    Spouse name: Not on file  . Number of children: Not on file  . Years of education: Not on file  . Highest education level: Not on file  Occupational History  . Not on file  Tobacco Use  . Smoking status: Former Smoker    Quit date: 10/15/2017    Years since quitting: 2.9  . Smokeless tobacco: Never Used  Vaping Use  . Vaping Use: Never used  Substance and Sexual Activity  . Alcohol use: Yes    Comment: occ  . Drug  use: No  . Sexual activity: Not on file  Other Topics Concern  . Not on file  Social History Narrative  . Not on file   Social Determinants of Health   Financial Resource Strain: Not on file  Food Insecurity: Not on file  Transportation Needs: Not on file  Physical Activity: Not on file  Stress: Not on file  Social Connections: Not on file    Vitals:   09/17/20 0828  BP: 128/80  Pulse: 72  Resp: 16  SpO2: 97%   Wt Readings from Last 3 Encounters:  09/17/20 241 lb (109.3 kg)  02/22/20 222 lb (100.7 kg)  12/02/19 221 lb 2 oz (100.3 kg)   Body mass index is 38.9 kg/m.  Physical Exam HENT:     Head: Normocephalic and atraumatic.  Eyes:     Conjunctiva/sclera: Conjunctivae normal.   Cardiovascular:     Rate and Rhythm: Normal rate and regular rhythm.     Pulses:          Dorsalis pedis pulses are 2+ on the right side and 2+ on the left side.     Heart sounds: No murmur heard.   Pulmonary:     Effort: Pulmonary effort is normal.     Breath sounds: Normal breath sounds.  Abdominal:     Palpations: Abdomen is soft. There is no mass.     Tenderness: There is no abdominal tenderness.  Musculoskeletal:     Lumbar back: Tenderness present. No bony tenderness. Negative right straight leg raise test and negative left straight leg raise test.       Back:     Right hip: No tenderness or bony tenderness. Normal range of motion.  Neurological:     General: No focal deficit present.     Mental Status: She is alert and oriented to person, place, and time.     Gait: Gait normal.     Comments: Able to walk on tip toes and heels.   ASSESSMENT AND PLAN:  [name] was seen today for back pain.  Diagnoses and all orders for this visit: Orders Placed This Encounter  Procedures  . DG Lumbar Spine Complete  . Ambulatory referral to Physical Therapy    Chronic right-sided low back pain, unspecified whether sciatica present With acute exacerbation. Because radiation to RLE recommend trial of Prednisone, which she has tolerated well in the past. She is not longer on Flexeril. Recommend Celebrex and Methocarbamol. Avoid exacerbating factors. PT will be arranged. Instructed about warning signs.  -     predniSONE (DELTASONE) 20 MG tablet; 3 tabs for 3 days, 2 tabs for 3 days, 1 tabs for 3 days, and 1/2 tab for 3 days. Take tables together with breakfast. -     celecoxib (CELEBREX) 100 MG capsule; Take 1 capsule (100 mg total) by mouth 2 (two) times daily. -     methocarbamol (ROBAXIN) 500 MG tablet; Take 1 tablet (500 mg total) by mouth 2 (two) times daily as needed for muscle spasms.  Class 2 severe obesity with serious comorbidity and body mass index (BMI) of 38.0 to  38.9 in adult, unspecified obesity type [facility]) She has gained about 18-19 Lb since 02/2020. She understands the benefits of wt loss as well as adverse effects of obesity. Consistency with healthy diet and physical activity recommended.  Return in about 3 months (around 12/15/2020), or if symptoms worsen or fail to improve.   Sadako Cegielski G. [place], MD  [facility]. [place]  office.   A few things to remember from today's visit:   We are going to try PT and medication for pain. Prednisone to take with food. If not any better or radiation to right leg gets worse, we may need to send you to ortho. Wt loss will definitively help.  Please be sure medication list is accurate. If a new problem present, please set up appointment sooner than planned today.

## 2020-09-17 NOTE — Patient Instructions (Signed)
A few things to remember from today's visit:   We are going to try PT and medication for pain. Prednisone to take with food. If not any better or radiation to right leg gets worse, we may need to send you to ortho. Wt loss will definitively help.  Please be sure medication list is accurate. If a new problem present, please set up appointment sooner than planned today.

## 2020-09-18 DIAGNOSIS — F413 Other mixed anxiety disorders: Secondary | ICD-10-CM | POA: Diagnosis not present

## 2020-09-19 ENCOUNTER — Telehealth: Payer: Self-pay | Admitting: Family Medicine

## 2020-09-19 NOTE — Telephone Encounter (Signed)
Please advise on results

## 2020-09-19 NOTE — Telephone Encounter (Signed)
See result note.  

## 2020-09-19 NOTE — Telephone Encounter (Signed)
Pt is calling in to get xray results and they are aware that the provider has not resulted it and when she does someone from the office will give them a call to let them know.

## 2020-09-26 ENCOUNTER — Telehealth: Payer: Self-pay | Admitting: Family Medicine

## 2020-09-26 DIAGNOSIS — M545 Low back pain, unspecified: Secondary | ICD-10-CM

## 2020-09-26 DIAGNOSIS — G8929 Other chronic pain: Secondary | ICD-10-CM

## 2020-09-26 NOTE — Telephone Encounter (Signed)
The patient said her back pain is not getting any better. She would like a referral to see an  Orthopedic Dr for her back pain.

## 2020-09-26 NOTE — Telephone Encounter (Signed)
Referral has been entered.

## 2020-09-27 ENCOUNTER — Ambulatory Visit (HOSPITAL_BASED_OUTPATIENT_CLINIC_OR_DEPARTMENT_OTHER): Payer: BC Managed Care – PPO | Attending: Family Medicine | Admitting: Physical Therapy

## 2020-09-27 ENCOUNTER — Other Ambulatory Visit: Payer: Self-pay

## 2020-09-27 ENCOUNTER — Encounter (HOSPITAL_BASED_OUTPATIENT_CLINIC_OR_DEPARTMENT_OTHER): Payer: Self-pay | Admitting: Physical Therapy

## 2020-09-27 DIAGNOSIS — R262 Difficulty in walking, not elsewhere classified: Secondary | ICD-10-CM | POA: Insufficient documentation

## 2020-09-27 DIAGNOSIS — M6281 Muscle weakness (generalized): Secondary | ICD-10-CM | POA: Insufficient documentation

## 2020-09-27 DIAGNOSIS — G8929 Other chronic pain: Secondary | ICD-10-CM | POA: Insufficient documentation

## 2020-09-27 DIAGNOSIS — M25651 Stiffness of right hip, not elsewhere classified: Secondary | ICD-10-CM | POA: Diagnosis not present

## 2020-09-27 DIAGNOSIS — R293 Abnormal posture: Secondary | ICD-10-CM | POA: Insufficient documentation

## 2020-09-27 DIAGNOSIS — M25551 Pain in right hip: Secondary | ICD-10-CM | POA: Diagnosis not present

## 2020-09-27 DIAGNOSIS — M545 Low back pain, unspecified: Secondary | ICD-10-CM | POA: Diagnosis not present

## 2020-09-27 NOTE — Therapy (Signed)
[facility] [address] [place], [place], [address] Phone: [phone]   Fax:  [phone]  Physical Therapy Evaluation  Patient Details  Name: [name] MRN: [id number] Date of Birth: 1971-07-04 Referring Provider (PT): [place], Betty G, MD   Encounter Date: 09/27/2020   PT End of Session - 09/27/20 0802    Visit Number 1    Number of Visits 13    Date for PT Re-Evaluation 11/08/20    Authorization Type BCBS    PT Start Time 0805    PT Stop Time 0852    PT Time Calculation (min) 47 min    Equipment Utilized During Treatment Gait belt    Activity Tolerance Patient tolerated treatment well    Behavior During Therapy [facility] for tasks assessed/performed           Past Medical History:  Diagnosis Date  . Anxiety   . Bipolar disorder (HCC)   . Depression   . History of kidney stones   . Seizures (HCC) ~ 2009   "from taking Wellbutrin"  . Symptomatic bradycardia     Past Surgical History:  Procedure Laterality Date  . COLONOSCOPY  05/2001   Hattie Perch 06/04/2001  . ESOPHAGOGASTRODUODENOSCOPY  11/2002   Hattie Perch 12/05/2002  . EYE SURGERY    . LAPAROSCOPIC CHOLECYSTECTOMY  12/2002  . LITHOTRIPSY  X 1  . STRABISMUS SURGERY  1975; 1997   "both eyes; right eye"  . TONSILLECTOMY  1980    There were no vitals filed for this visit.    Subjective Assessment - 09/27/20 0805    Subjective Pt reports right sided back pain. Pt states she fell 3-4 years ago down cement steps on to her right side. Pt states it has gotten steadily worse. Pt has been referred to orthopedic and will be seeing Dr. Oleta Mouse on Monday. Pt reports she got an x-ray. 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 Pt states she is current on weight watchers and trying to get on a walking program. Pt states she prefers to sleep on her right side due to increased pain when sleeping on left.    Pertinent History anxiety, bipolar, depression, kidney stones,  bradycardia    How long can you sit comfortably? ~1 hr    How long can you stand comfortably? ~1.5 hrs and then rest    How long can you walk comfortably? Unable to walk around grocery store    Diagnostic tests x-ray: Mild lumbar sigmoid scoliosis.   Asymmetric severe right facet arthrosis at L4-5 and L5-S1.  Mild degenerative disc disease with associated retrolisthesis at  L3-4.    Patient Stated Goals Learning tools, exercises and stretches to alleviate pain, learning good habits    Currently in Pain? Yes    Pain Score 2    Progresses throughout the day; 7/10 at worst   Pain Location Back    Pain Orientation Right    Pain Descriptors / Indicators Dull;Aching   Intermittently sharp or "grabs you"   Pain Type Chronic pain    Pain Radiating Towards Every once in a while down on leg    Pain Onset More than a month ago    Pain Frequency Constant    Aggravating Factors  Walking, standing, sitting    Pain Relieving Factors Heat, lidocaine patch    Effect of Pain on Daily Activities Walking, sitting, standing              [facility] PT  Assessment - 09/27/20 0001      Assessment   Medical Diagnosis M54.50,G89.29 (ICD-10-CM) - Chronic right-sided low back pain, unspecified whether sciatica present    Referring Provider (PT) [place], Betty G, MD    Onset Date/Surgical Date --   3-4 years ago   Hand Dominance Right    Next MD Visit 10/01/20    Prior Therapy None      Precautions   Precautions Back    Precaution Comments Back pain      Balance Screen   Has the patient fallen in the past 6 months No      Home Environment   Living Environment Private residence    Living Arrangements Spouse/significant other    Available Help at Discharge Family      Prior Function   Level of Independence Independent    Vocation Full time employment    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)      Observation/Other Assessments   Observations R higher than L iliac crest;  visually appears to have increased R posterior inominate vs L      Sensation   Light Touch Appears Intact      Functional Tests   Functional tests Sit to Stand   TBA     Sit to Stand   Comments Mild R hip hike and anteriorly rotated      Posture/Postural Control   Posture/Postural Control Postural limitations    Postural Limitations Decreased lumbar lordosis;Anterior pelvic tilt      AROM   Lumbar Flexion WFL    Lumbar Extension WFL   Mild pain   Lumbar - Right Side Bend WFL    Lumbar - Left Side Bend 90%    Lumbar - Right Rotation 40%    Lumbar - Left Rotation 30%      Strength   Overall Strength Comments Grossly 5/5 hip flex, knee, and ankle    Right Hip Extension 3+/5    Right Hip ABduction 3+/5      Palpation   Palpation comment TTP PSIS and reproducible pain on palpating piriformis and glute med      Special Tests   Lumbar Tests Straight Leg Raise;FABER test;Slump Test;Prone Knee Bend Test    Sacroiliac Tests  Sacral Thrust      FABER test   findings Positive    Side Right    Comment Reproduces back symptoms      Slump test   Findings Negative      Prone Knee Bend Test   Findings Negative      Straight Leg Raise   Findings Positive    Side  Right    Comment ~60 deg      Sacral thrust    Findings Positive    Comments Symptoms produced on right      Gaenslen's test   Findings Positive    Side  Right      Sacral Compression   Findings Positive    Side  Right      Ambulation/Gait   Gait Pattern Within Functional Limits                      Objective measurements completed on examination: See above findings.       [facility] Adult PT Treatment/Exercise - 09/27/20 0001      Exercises   Exercises Lumbar;Knee/Hip      Lumbar Exercises: Stretches   Pelvic Tilt 10 reps    Piriformis  Stretch Right;20 seconds   Seated   Other Lumbar Stretch Exercise R lateral shift against wall x20 sec      Lumbar Exercises: Supine   Clam 10 reps    lime tband   Bridge 10 reps      Modalities   Modalities Geologist, engineering Location R glute med    Electrical Stimulation Action Premod    Electrical Stimulation Parameters To pt tolerance    Electrical Stimulation Goals Pain                  PT Education - 09/27/20 0940    Education Details Discussed exam findings, POC, and TENS    Person(s) Educated Patient    Methods Explanation;Verbal cues;Handout;Demonstration    Comprehension Verbalized understanding;Returned demonstration;Verbal cues required;Tactile cues required            PT Short Term Goals - 09/27/20 0925      PT SHORT TERM GOAL #1   Title Pt will be able to verbalize at least 3 methods to manage her pain    Time 3    Period Weeks    Status New    Target Date 10/18/20      PT SHORT TERM GOAL #2   Title Pt will be able to squat at least 25# to demo improved functional strength    Baseline 3+/5 R hip abduction and extension on assessment    Time 3    Period Weeks    Status New    Target Date 10/18/20      PT SHORT TERM GOAL #3   Title Pt will be able to tolerate sleeping on her L side (preferred sleeping side) without pain    Time 3    Period Weeks    Status New    Target Date 10/18/20             PT Long Term Goals - 09/27/20 0928      PT LONG TERM GOAL #1   Title Pt will be independent with HEP    Time 6    Period Weeks    Status New    Target Date 11/08/20      PT LONG TERM GOAL #2   Title Pt will be able to shop for groceries with </=3/10 pain    Baseline Limits grocery shopping due to pain    Time 6    Period Weeks    Status New    Target Date 11/08/20      PT LONG TERM GOAL #3   Title PT will have SLS goal as indicated to demo improved hip/pelvic stability    Time 6    Period Weeks    Status New    Target Date 11/08/20      PT LONG TERM GOAL #4   Title Pt will be able to increase standing time >1 hr without  having to rest due to pain    Baseline Requires rest due to pain    Time 6    Period Weeks    Status New    Target Date 11/08/20                  Plan - 09/27/20 0915    Clinical Impression Statement Ms. Lindsey is a 50 y/o F presenting to OPPT due to complaint of ongoing R low back pain. On assessment, pt with (+) s/s consistent with SI joint  dysfunction (FABER, gaenslen, sacral thrust). Pt observed to have slightly higher R iliac crest vs L. Pt found to have R>L hip weakness and decreased flexibility along with pain and tenderness on palpation of glute and piriformis. Will assess for leg length discrepancy on next visit. Pt would benefit from PT to optimize her level of function and reduce pain.    Personal Factors and Comorbidities Age;Comorbidity 1;Time since onset of injury/illness/exacerbation    Comorbidities anxiety, bipolar, depression, kidney stones, bradycardia    Examination-Activity Limitations Sit;Stand;Squat;Locomotion Level    Examination-Participation Restrictions Community Activity;Cleaning;Occupation;Shop    Stability/Clinical Decision Making Stable/Uncomplicated    Clinical Decision Making Low    Rehab Potential Good    PT Frequency 2x / week    PT Duration 6 weeks    PT Treatment/Interventions ADLs/Self Care Home Management;Aquatic Therapy;Cryotherapy;Electrical Stimulation;Iontophoresis 4mg /ml Dexamethasone;Moist Heat;Ultrasound;Gait training;Stair training;Functional mobility training;Therapeutic activities;Therapeutic exercise;Balance training;Neuromuscular re-education;Manual techniques;Patient/family education;Passive range of motion;Dry needling;Taping;Joint Manipulations;Spinal Manipulations    PT Next Visit Plan Assess response to HEP. Check leg length. Assess squat and single leg stance. Address pelvic alignment as needed with METs, stretching, and strengthening. Consider planks.    PT Home Exercise Plan Access Code: [email or web address]    Consulted and Agree with  Plan of Care Patient           Patient will benefit from skilled therapeutic intervention in order to improve the following deficits and impairments:  Decreased range of motion,Difficulty walking,Increased fascial restricitons,Decreased activity tolerance,Pain,Hypomobility,Impaired flexibility,Improper body mechanics,Decreased mobility,Decreased strength,Postural dysfunction  Visit Diagnosis: Muscle weakness (generalized)  Pain in right hip  Stiffness of right hip, not elsewhere classified  Chronic right-sided low back pain without sciatica  Difficulty in walking, not elsewhere classified  Abnormal posture     Problem List Patient Active Problem List   Diagnosis Date Noted  . Class 2 obesity with body mass index (BMI) of 38.0 to 38.9 in adult 09/17/2020  . Chronic lower back pain 12/14/2018  . ADHD 12/14/2018  . Hypothyroidism (acquired) 12/14/2018  . Depression, major, recurrent, in partial remission (HCC) 12/14/2018  . Fall   . Symptomatic bradycardia 07/29/2016  . Hypotension 07/29/2016  . Smoker 07/29/2016  . Bipolar disorder [facility])     Jatavion Peaster April Ma L Monte Bronder PT, DPT 09/27/2020, 9:40 AM  [facility] [address] [place], [place], [place] Phone: [phone]   Fax:  [phone]  Name: Tammela Bales MRN: Macy Mis Date of Birth: 04/19/1971

## 2020-09-27 NOTE — Patient Instructions (Signed)
Access Code: [email or web address] URL: [email or web address] Date: 09/27/2020 Prepared by: Vernon Prey April Kirstie Peri  Exercises Supine Posterior Pelvic Tilt - 1 x daily - 7 x weekly - 2 sets - 10 reps Supine Bridge - 1 x daily - 7 x weekly - 2 sets - 10 reps Hooklying Clamshell with Resistance - 1 x daily - 7 x weekly - 2 sets - 10 reps Right Standing Lateral Shift Correction at Wall - Repetitions - 1 x daily - 7 x weekly - 10 reps - 3 sec hold Seated Piriformis Stretch - 1 x daily - 7 x weekly - 20-30 sec hold  Patient Education TENS Unit

## 2020-10-01 ENCOUNTER — Encounter: Payer: Self-pay | Admitting: Family Medicine

## 2020-10-01 ENCOUNTER — Ambulatory Visit (INDEPENDENT_AMBULATORY_CARE_PROVIDER_SITE_OTHER): Payer: BC Managed Care – PPO | Admitting: Family Medicine

## 2020-10-01 ENCOUNTER — Other Ambulatory Visit: Payer: Self-pay

## 2020-10-01 DIAGNOSIS — M545 Low back pain, unspecified: Secondary | ICD-10-CM | POA: Diagnosis not present

## 2020-10-01 DIAGNOSIS — G8929 Other chronic pain: Secondary | ICD-10-CM

## 2020-10-01 NOTE — Progress Notes (Signed)
Office Visit Note   [name]           Date of Birth: Jul 22, 1971           MRN: [id number] Visit Date: 10/01/2020 Requested by: [place], Betty G, MD [address] [place],  [place] [address] PCP: [place], Betty G, MD  Subjective: Chief Complaint  Patient presents with  . Lower Back - Pain    Right-sided low back pain x years. Has had several falls, most 3 years ago - fell backward while getting in a car, hitting her lower back against a curb. One year before that, she fell down some cement stairs at work, hurting her right side/back. Has been to PT once (last Thursday) and the stretches she was shown do help.    HPI: She is here with right-sided low back pain.  Symptoms started about 3 years ago when she fell at work, landing backward on her lower back.  She went to an urgent care, x-rays were negative for fracture and she was given some muscle relaxants.  Her pain improved but it never went away completely.  Over the past year her pain has become more constant.  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  Occasionally the pain radiates into the legs, no numbness or weakness, no bowel or bladder dysfunction.  She has been taking ibuprofen which gives her some relief.  She recently went to physical therapy and did notice some improvement after just one treatment.  She has started weight watchers and has already lost 10 pounds.  Her mother had a very bad result with back surgery so patient would like to avoid any kind of surgery as long as possible.                ROS:   All other systems were reviewed and are negative.  Objective: Vital Signs: There were no vitals taken for this visit.  Physical Exam:  General:  Alert and oriented, in no acute distress. Pulm:  Breathing unlabored. Psy:  Normal mood, congruent affect. Skin no rash Low back: She is tender primarily over the right SI joint.   No pain in the sciatic notch.  No pain with internal hip rotation.  Lower extremity strength and reflexes are normal.  Imaging: Recent x-rays viewed on computer show degenerative changes at the L4-5 and L5-S1 levels.    Assessment & Plan: 1.  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  Cannot rule out lumbar disc protrusion. -Continue with physical therapy.  She did not want any medications.  If she fails to improve, we will order MRI lumbar spine.  Depending on results, could try chiropractic or possibly injection.     Procedures: No procedures performed        PMFS History: Patient Active Problem List   Diagnosis Date Noted  . Class 2 obesity with body mass index (BMI) of 38.0 to 38.9 in adult 09/17/2020  . Chronic lower back pain 12/14/2018  . ADHD 12/14/2018  . Hypothyroidism (acquired) 12/14/2018  . Depression, major, recurrent, in partial remission (HCC) 12/14/2018  . Fall   . Symptomatic bradycardia 07/29/2016  . Hypotension 07/29/2016  . Smoker 07/29/2016  . Bipolar disorder [facility])    Past Medical History:  Diagnosis Date  . Anxiety   . Bipolar disorder (HCC)   . Depression   . History of kidney  stones   . Seizures (HCC) ~ 2009   "from taking Wellbutrin"  . Symptomatic bradycardia     History reviewed. No pertinent family history.  Past Surgical History:  Procedure Laterality Date  . COLONOSCOPY  05/2001   Hattie Perch 06/04/2001  . ESOPHAGOGASTRODUODENOSCOPY  11/2002   Hattie Perch 12/05/2002  . EYE SURGERY    . LAPAROSCOPIC CHOLECYSTECTOMY  12/2002  . LITHOTRIPSY  X 1  . STRABISMUS SURGERY  1975; 1997   "both eyes; right eye"  . TONSILLECTOMY  1980   Social History   Occupational History  . Not on file  Tobacco Use  . Smoking status: Former Smoker    Quit date: 10/15/2017    Years since quitting: 2.9  . Smokeless tobacco: Never Used  Vaping Use  . Vaping Use: Never used  Substance and Sexual Activity  . Alcohol use: Yes     Comment: occ  . Drug use: No  . Sexual activity: Not on file

## 2020-10-02 ENCOUNTER — Ambulatory Visit (HOSPITAL_BASED_OUTPATIENT_CLINIC_OR_DEPARTMENT_OTHER): Payer: BC Managed Care – PPO | Attending: Physical Therapy | Admitting: Physical Therapy

## 2020-10-04 ENCOUNTER — Ambulatory Visit (HOSPITAL_BASED_OUTPATIENT_CLINIC_OR_DEPARTMENT_OTHER): Payer: BC Managed Care – PPO | Attending: Family Medicine | Admitting: Physical Therapy

## 2020-10-05 DIAGNOSIS — F900 Attention-deficit hyperactivity disorder, predominantly inattentive type: Secondary | ICD-10-CM | POA: Diagnosis not present

## 2020-10-05 DIAGNOSIS — F1011 Alcohol abuse, in remission: Secondary | ICD-10-CM | POA: Diagnosis not present

## 2020-10-05 DIAGNOSIS — F3162 Bipolar disorder, current episode mixed, moderate: Secondary | ICD-10-CM | POA: Diagnosis not present

## 2020-10-09 ENCOUNTER — Ambulatory Visit (HOSPITAL_BASED_OUTPATIENT_CLINIC_OR_DEPARTMENT_OTHER): Payer: BC Managed Care – PPO | Attending: Family Medicine | Admitting: Physical Therapy

## 2020-10-09 ENCOUNTER — Other Ambulatory Visit: Payer: Self-pay

## 2020-10-09 DIAGNOSIS — G8929 Other chronic pain: Secondary | ICD-10-CM | POA: Insufficient documentation

## 2020-10-09 DIAGNOSIS — R293 Abnormal posture: Secondary | ICD-10-CM | POA: Diagnosis not present

## 2020-10-09 DIAGNOSIS — M25551 Pain in right hip: Secondary | ICD-10-CM | POA: Diagnosis not present

## 2020-10-09 DIAGNOSIS — M6281 Muscle weakness (generalized): Secondary | ICD-10-CM | POA: Diagnosis not present

## 2020-10-09 DIAGNOSIS — M545 Low back pain, unspecified: Secondary | ICD-10-CM | POA: Diagnosis not present

## 2020-10-09 DIAGNOSIS — M25651 Stiffness of right hip, not elsewhere classified: Secondary | ICD-10-CM | POA: Insufficient documentation

## 2020-10-09 DIAGNOSIS — R262 Difficulty in walking, not elsewhere classified: Secondary | ICD-10-CM | POA: Diagnosis not present

## 2020-10-09 NOTE — Therapy (Signed)
[facility] [address] [place], [place], [address] Phone: [phone]   Fax:  [phone]  Physical Therapy Treatment  Patient Details  Name: [name] MRN: [id number] Date of Birth: 12/08/1970 Referring Provider (PT): [place], Betty G, MD   Encounter Date: 10/09/2020   PT End of Session - 10/09/20 0843    Visit Number 2    Number of Visits 13    Date for PT Re-Evaluation 11/08/20    Authorization Type BCBS    PT Start Time 0801    PT Stop Time 0844    PT Time Calculation (min) 43 min    Activity Tolerance Patient tolerated treatment well    Behavior During Therapy [facility] for tasks assessed/performed           Past Medical History:  Diagnosis Date  . Anxiety   . Bipolar disorder (HCC)   . Depression   . History of kidney stones   . Seizures (HCC) ~ 2009   "from taking Wellbutrin"  . Symptomatic bradycardia     Past Surgical History:  Procedure Laterality Date  . COLONOSCOPY  05/2001   Hattie Perch 06/04/2001  . ESOPHAGOGASTRODUODENOSCOPY  11/2002   Hattie Perch 12/05/2002  . EYE SURGERY    . LAPAROSCOPIC CHOLECYSTECTOMY  12/2002  . LITHOTRIPSY  X 1  . STRABISMUS SURGERY  1975; 1997   "both eyes; right eye"  . TONSILLECTOMY  1980    There were no vitals filed for this visit.   Subjective Assessment - 10/09/20 0804    Subjective Pt states she went to see Dr. Prince Rome. Pt reports Dr. Oleta Mouse also thinks pain might be due to SI joint. Pt states that she's been doing the exercises but for the most part her pain has already decreased.    Pertinent History anxiety, bipolar, depression, kidney stones, bradycardia    How long can you sit comfortably? ~1 hr    How long can you stand comfortably? ~1.5 hrs and then rest    How long can you walk comfortably? Unable to walk around grocery store    Diagnostic tests x-ray: Mild lumbar sigmoid scoliosis.   Asymmetric severe right facet arthrosis at L4-5 and L5-S1.  Mild degenerative  disc disease with associated retrolisthesis at  L3-4.    Patient Stated Goals Learning tools, exercises and stretches to alleviate pain, learning good habits    Currently in Pain? No/denies    Pain Onset More than a month ago                             [facility] Adult PT Treatment/Exercise - 10/09/20 0001      Lumbar Exercises: Stretches   Piriformis Stretch Right;20 seconds;Left    Piriformis Stretch Limitations Supine      Lumbar Exercises: Standing   Wall Slides 20 reps    Wall Slides Limitations with PPT    Other Standing Lumbar Exercises Modified plank on raised plinth 2x20 sec    Other Standing Lumbar Exercises Modified side plank on raised plinth 2x20 sec both sides      Lumbar Exercises: Supine   Clam 20 reps    Clam Limitations with PPT    Bridge 20 reps    Bridge Limitations with PPT                    PT Short Term Goals - 09/27/20 0925      PT SHORT TERM GOAL #  1   Title Pt will be able to verbalize at least 3 methods to manage her pain    Time 3    Period Weeks    Status New    Target Date 10/18/20      PT SHORT TERM GOAL #2   Title Pt will be able to squat at least 25# to demo improved functional strength    Baseline 3+/5 R hip abduction and extension on assessment    Time 3    Period Weeks    Status New    Target Date 10/18/20      PT SHORT TERM GOAL #3   Title Pt will be able to tolerate sleeping on her L side (preferred sleeping side) without pain    Time 3    Period Weeks    Status New    Target Date 10/18/20             PT Long Term Goals - 09/27/20 0928      PT LONG TERM GOAL #1   Title Pt will be independent with HEP    Time 6    Period Weeks    Status New    Target Date 11/08/20      PT LONG TERM GOAL #2   Title Pt will be able to shop for groceries with </=3/10 pain    Baseline Limits grocery shopping due to pain    Time 6    Period Weeks    Status New    Target Date 11/08/20      PT LONG TERM GOAL  #3   Title PT will have SLS goal as indicated to demo improved hip/pelvic stability    Time 6    Period Weeks    Status New    Target Date 11/08/20      PT LONG TERM GOAL #4   Title Pt will be able to increase standing time >1 hr without having to rest due to pain    Baseline Requires rest due to pain    Time 6    Period Weeks    Status New    Target Date 11/08/20                 Plan - 10/09/20 8921    Clinical Impression Statement Treatment focused on reviewing HEP and progressing as able. Initiated planking with good patient tolerance. Pt demos mild leg length discrepancy but nothing overtly significant. Pt demos improved alignment from previous session.    Personal Factors and Comorbidities Age;Comorbidity 1;Time since onset of injury/illness/exacerbation    Comorbidities anxiety, bipolar, depression, kidney stones, bradycardia    Examination-Activity Limitations Sit;Stand;Squat;Locomotion Level    Examination-Participation Restrictions Community Activity;Cleaning;Occupation;Shop    Stability/Clinical Decision Making Stable/Uncomplicated    Rehab Potential Good    PT Frequency 2x / week    PT Duration 6 weeks    PT Treatment/Interventions ADLs/Self Care Home Management;Aquatic Therapy;Cryotherapy;Electrical Stimulation;Iontophoresis 4mg /ml Dexamethasone;Moist Heat;Ultrasound;Gait training;Stair training;Functional mobility training;Therapeutic activities;Therapeutic exercise;Balance training;Neuromuscular re-education;Manual techniques;Patient/family education;Passive range of motion;Dry needling;Taping;Joint Manipulations;Spinal Manipulations    PT Next Visit Plan Assess response to HEP. Continue to progress squat and single leg stance. Address pelvic alignment as needed. Progress planking.    PT Home Exercise Plan Access Code: [email or web address]    Consulted and Agree with Plan of Care Patient           Patient will benefit from skilled therapeutic intervention in order to  improve the following deficits and impairments:  Decreased range  of motion,Difficulty walking,Increased fascial restricitons,Decreased activity tolerance,Pain,Hypomobility,Impaired flexibility,Improper body mechanics,Decreased mobility,Decreased strength,Postural dysfunction  Visit Diagnosis: Muscle weakness (generalized)  Pain in right hip  Stiffness of right hip, not elsewhere classified  Chronic right-sided low back pain without sciatica  Difficulty in walking, not elsewhere classified  Abnormal posture     Problem List Patient Active Problem List   Diagnosis Date Noted  . Class 2 obesity with body mass index (BMI) of 38.0 to 38.9 in adult 09/17/2020  . Chronic lower back pain 12/14/2018  . ADHD 12/14/2018  . Hypothyroidism (acquired) 12/14/2018  . Depression, major, recurrent, in partial remission (HCC) 12/14/2018  . Fall   . Symptomatic bradycardia 07/29/2016  . Hypotension 07/29/2016  . Smoker 07/29/2016  . Bipolar disorder [facility])     Tzivia Oneil April Ma L Ajay Strubel PT, DPT 10/09/2020, 8:44 AM  [facility] [address] [place], [place], [address] Phone: [phone]   Fax:  [phone]  Name: Arianne Klinge MRN: [id number] Date of Birth: [birth year]

## 2020-10-11 ENCOUNTER — Ambulatory Visit (HOSPITAL_BASED_OUTPATIENT_CLINIC_OR_DEPARTMENT_OTHER): Payer: BC Managed Care – PPO | Admitting: Physical Therapy

## 2020-10-16 ENCOUNTER — Ambulatory Visit (HOSPITAL_BASED_OUTPATIENT_CLINIC_OR_DEPARTMENT_OTHER): Payer: BC Managed Care – PPO | Attending: Family Medicine | Admitting: Physical Therapy

## 2020-10-16 DIAGNOSIS — M545 Low back pain, unspecified: Secondary | ICD-10-CM | POA: Insufficient documentation

## 2020-10-16 DIAGNOSIS — R293 Abnormal posture: Secondary | ICD-10-CM | POA: Insufficient documentation

## 2020-10-16 DIAGNOSIS — M25651 Stiffness of right hip, not elsewhere classified: Secondary | ICD-10-CM | POA: Insufficient documentation

## 2020-10-16 DIAGNOSIS — M6281 Muscle weakness (generalized): Secondary | ICD-10-CM | POA: Insufficient documentation

## 2020-10-16 DIAGNOSIS — G8929 Other chronic pain: Secondary | ICD-10-CM | POA: Insufficient documentation

## 2020-10-16 DIAGNOSIS — M25551 Pain in right hip: Secondary | ICD-10-CM | POA: Insufficient documentation

## 2020-10-16 DIAGNOSIS — R262 Difficulty in walking, not elsewhere classified: Secondary | ICD-10-CM | POA: Insufficient documentation

## 2020-10-18 ENCOUNTER — Ambulatory Visit (HOSPITAL_BASED_OUTPATIENT_CLINIC_OR_DEPARTMENT_OTHER): Payer: BC Managed Care – PPO | Admitting: Physical Therapy

## 2020-10-18 ENCOUNTER — Other Ambulatory Visit: Payer: Self-pay

## 2020-10-18 DIAGNOSIS — M6281 Muscle weakness (generalized): Secondary | ICD-10-CM

## 2020-10-18 DIAGNOSIS — R262 Difficulty in walking, not elsewhere classified: Secondary | ICD-10-CM | POA: Diagnosis not present

## 2020-10-18 DIAGNOSIS — R293 Abnormal posture: Secondary | ICD-10-CM

## 2020-10-18 DIAGNOSIS — M545 Low back pain, unspecified: Secondary | ICD-10-CM | POA: Diagnosis not present

## 2020-10-18 DIAGNOSIS — M25551 Pain in right hip: Secondary | ICD-10-CM | POA: Diagnosis not present

## 2020-10-18 DIAGNOSIS — G8929 Other chronic pain: Secondary | ICD-10-CM | POA: Diagnosis not present

## 2020-10-18 DIAGNOSIS — M25651 Stiffness of right hip, not elsewhere classified: Secondary | ICD-10-CM | POA: Diagnosis not present

## 2020-10-18 NOTE — Therapy (Addendum)
[facility] [address] [place], [place], [address] Phone: [phone]   Fax:  [phone]  Physical Therapy Treatment and Discharge  Patient Details  Name: [name] MRN: [id number] Date of Birth: [birth year] Referring Provider (PT): [place], Betty G, MD  PHYSICAL THERAPY DISCHARGE SUMMARY  Visits from Start of Care: 3  Current functional level related to goals / functional outcomes: No pain or issues on last visit   Remaining deficits: No issues. Pt had achieved all of her STGs last visit and was making good progress towards LTGs.    Education / Equipment: Had discussed possible d/c and rechecking LTGs.   Plan: Patient agrees to discharge.  Patient goals were met. Patient is being discharged due to not returning since the last visit.  ?????        Encounter Date: 10/18/2020   PT End of Session - 10/18/20 0808    Visit Number 3    Number of Visits 13    Date for PT Re-Evaluation 11/08/20    Authorization Type BCBS    PT Start Time 0803    PT Stop Time 0845    PT Time Calculation (min) 42 min    Activity Tolerance Patient tolerated treatment well    Behavior During Therapy [facility] for tasks assessed/performed           Past Medical History:  Diagnosis Date  . Anxiety   . Bipolar disorder ([place])   . Depression   . History of kidney stones   . Seizures ([place]) ~ 2009   "from taking Wellbutrin"  . Symptomatic bradycardia     Past Surgical History:  Procedure Laterality Date  . COLONOSCOPY  05/2001   Archie Endo 06/04/2001  . ESOPHAGOGASTRODUODENOSCOPY  11/2002   Archie Endo 12/05/2002  . EYE SURGERY    . LAPAROSCOPIC CHOLECYSTECTOMY  12/2002  . LITHOTRIPSY  X [place]; 1997   "both eyes; right eye"  . TONSILLECTOMY  1980    There were no vitals filed for this visit.   Subjective Assessment - 10/18/20 0806    Subjective Pt states her back has not hurt in the last 2 days. Pt states she's  been able to sleep on her left side. She's been able to lift without pain.    Pertinent History anxiety, bipolar, depression, kidney stones, bradycardia    How long can you sit comfortably? ~1 hr    How long can you stand comfortably? ~1.5 hrs and then rest    How long can you walk comfortably? Unable to walk around grocery store    Diagnostic tests x-ray: Mild lumbar sigmoid scoliosis.   Asymmetric severe right facet arthrosis at L4-5 and L5-S1.  Mild degenerative disc disease with associated retrolisthesis at  L3-4.    Patient Stated Goals Learning tools, exercises and stretches to alleviate pain, learning good habits    Pain Onset More than a month ago                             [facility] Adult PT Treatment/Exercise - 10/18/20 0001      Lumbar Exercises: Aerobic   Other Aerobic Exercise Sci-fit L2 x 5 min      Lumbar Exercises: Standing   Wall Slides 20 reps    Wall Slides Limitations with PPT    Other Standing Lumbar Exercises lateral band walk with green tband 2x10 bilat      Lumbar  Exercises: Supine   Clam 20 reps    Clam Limitations with PPT + dark pink band    Bridge with [employer];20 reps      Knee/Hip Exercises: Stretches   Press photographer Both;20 seconds    Soleus Stretch Both;20 seconds      Knee/Hip Exercises: Standing   Functional Squat 2 sets;10 reps    Functional Squat Limitations Squat to chair                    PT Short Term Goals - 10/18/20 1049      PT SHORT TERM GOAL #1   Title Pt will be able to verbalize at least 3 methods to manage her pain    Time 3    Period Weeks    Status Achieved    Target Date 10/18/20      PT SHORT TERM GOAL #2   Title Pt will be able to squat at least 25# to demo improved functional strength    Baseline 3+/5 R hip abduction and extension on assessment; learned proper squatting form 10/18/20    Time 3    Period Weeks    Status On-going    Target Date 10/18/20      PT SHORT TERM GOAL #3    Title Pt will be able to tolerate sleeping on her L side (preferred sleeping side) without pain    Time 3    Period Weeks    Status Achieved    Target Date 10/18/20             PT Long Term Goals - 09/27/20 0928      PT LONG TERM GOAL #1   Title Pt will be independent with HEP    Time 6    Period Weeks    Status New    Target Date 11/08/20      PT LONG TERM GOAL #2   Title Pt will be able to shop for groceries with </=3/10 pain    Baseline Limits grocery shopping due to pain    Time 6    Period Weeks    Status New    Target Date 11/08/20      PT LONG TERM GOAL #3   Title PT will have SLS goal as indicated to demo improved hip/pelvic stability    Time 6    Period Weeks    Status New    Target Date 11/08/20      PT LONG TERM GOAL #4   Title Pt will be able to increase standing time >1 hr without having to rest due to pain    Baseline Requires rest due to pain    Time 6    Period Weeks    Status New    Target Date 11/08/20                 Plan - 10/18/20 0845    Clinical Impression Statement Treatment focused on continuing to progress pt's HEP and modifying as needed. Educated pt on proper squat form. Pt has been doing well with no pain.    Personal Factors and Comorbidities Age;Comorbidity 1;Time since onset of injury/illness/exacerbation    Comorbidities anxiety, bipolar, depression, kidney stones, bradycardia    Examination-Activity Limitations Sit;Stand;Squat;Locomotion Level    Examination-Participation Restrictions Community Activity;Cleaning;Occupation;Shop    Stability/Clinical Decision Making Stable/Uncomplicated    Rehab Potential Good    PT Frequency 2x / week    PT Duration 6 weeks  PT Treatment/Interventions ADLs/Self Care Home Management;Aquatic Therapy;Cryotherapy;Electrical Stimulation;Iontophoresis 90m/ml Dexamethasone;Moist Heat;Ultrasound;Gait training;Stair training;Functional mobility training;Therapeutic activities;Therapeutic  exercise;Balance training;Neuromuscular re-education;Manual techniques;Patient/family education;Passive range of motion;Dry needling;Taping;Joint Manipulations;Spinal Manipulations    PT Next Visit Plan Assess response to HEP. Continue to progress squat and single leg stance. Address pelvic alignment as needed. Progress planking.    PT Home Exercise Plan Access Code: [email or web address]    Consulted and Agree with Plan of Care Patient           Patient will benefit from skilled therapeutic intervention in order to improve the following deficits and impairments:  Decreased range of motion,Difficulty walking,Increased fascial restricitons,Decreased activity tolerance,Pain,Hypomobility,Impaired flexibility,Improper body mechanics,Decreased mobility,Decreased strength,Postural dysfunction  Visit Diagnosis: Muscle weakness (generalized)  Pain in right hip  Stiffness of right hip, not elsewhere classified  Chronic right-sided low back pain without sciatica  Difficulty in walking, not elsewhere classified  Abnormal posture     Problem List Patient Active Problem List   Diagnosis Date Noted  . Class 2 obesity with body mass index (BMI) of 38.0 to 38.9 in adult 09/17/2020  . Chronic lower back pain 12/14/2018  . ADHD 12/14/2018  . Hypothyroidism (acquired) 12/14/2018  . Depression, major, recurrent, in partial remission ([place] 12/14/2018  . Fall   . Symptomatic bradycardia 07/29/2016  . Hypotension 07/29/2016  . Smoker 07/29/2016  . Bipolar disorder ([facility]     Jamil Castillo April Ma L Leitha Hyppolite PT, DPT 10/18/2020, 10:52 AM  [facility] [address]Phone: [phone]  Fax:  [phone] Name: CChiquitta MattyMRN: [id number] of Birth: [birth year]

## 2020-10-23 ENCOUNTER — Ambulatory Visit (HOSPITAL_BASED_OUTPATIENT_CLINIC_OR_DEPARTMENT_OTHER): Payer: BC Managed Care – PPO | Admitting: Physical Therapy

## 2020-10-25 ENCOUNTER — Ambulatory Visit (HOSPITAL_BASED_OUTPATIENT_CLINIC_OR_DEPARTMENT_OTHER): Payer: BC Managed Care – PPO | Admitting: Physical Therapy

## 2020-10-30 ENCOUNTER — Ambulatory Visit (HOSPITAL_BASED_OUTPATIENT_CLINIC_OR_DEPARTMENT_OTHER): Payer: BC Managed Care – PPO | Admitting: Physical Therapy

## 2020-11-01 ENCOUNTER — Ambulatory Visit (HOSPITAL_BASED_OUTPATIENT_CLINIC_OR_DEPARTMENT_OTHER): Payer: BC Managed Care – PPO | Admitting: Physical Therapy

## 2020-11-06 ENCOUNTER — Ambulatory Visit (HOSPITAL_BASED_OUTPATIENT_CLINIC_OR_DEPARTMENT_OTHER): Payer: BC Managed Care – PPO | Admitting: Physical Therapy

## 2020-11-07 DIAGNOSIS — F4322 Adjustment disorder with anxiety: Secondary | ICD-10-CM | POA: Diagnosis not present

## 2020-11-08 ENCOUNTER — Ambulatory Visit (HOSPITAL_BASED_OUTPATIENT_CLINIC_OR_DEPARTMENT_OTHER): Payer: BC Managed Care – PPO | Admitting: Physical Therapy

## 2020-11-20 ENCOUNTER — Ambulatory Visit: Payer: BC Managed Care – PPO | Admitting: Family Medicine

## 2020-11-28 ENCOUNTER — Ambulatory Visit (INDEPENDENT_AMBULATORY_CARE_PROVIDER_SITE_OTHER): Payer: BC Managed Care – PPO | Admitting: Family Medicine

## 2020-11-28 ENCOUNTER — Other Ambulatory Visit: Payer: Self-pay

## 2020-11-28 ENCOUNTER — Encounter: Payer: Self-pay | Admitting: Family Medicine

## 2020-11-28 DIAGNOSIS — F4322 Adjustment disorder with anxiety: Secondary | ICD-10-CM | POA: Diagnosis not present

## 2020-11-28 DIAGNOSIS — M545 Low back pain, unspecified: Secondary | ICD-10-CM | POA: Diagnosis not present

## 2020-11-28 DIAGNOSIS — G8929 Other chronic pain: Secondary | ICD-10-CM | POA: Diagnosis not present

## 2020-11-28 MED ORDER — BACLOFEN 10 MG PO TABS: 5.0000 mg | ORAL_TABLET | Freq: Three times a day (TID) | ORAL | 3 refills | Status: DC | PRN

## 2020-11-28 MED ORDER — NABUMETONE 500 MG PO TABS: 500.0000 mg | ORAL_TABLET | Freq: Two times a day (BID) | ORAL | 3 refills | Status: DC | PRN

## 2020-11-28 NOTE — Progress Notes (Signed)
Office Visit Note   [name]           Date of Birth: September 08, 1970           MRN: [id number] Visit Date: 11/28/2020 Requested by: [place], Betty G, MD [address] [place],  [place] [address] PCP: [place], Betty G, MD  Subjective: Chief Complaint  Patient presents with  . Lower Back - Pain, Follow-up    Did PT - pain subsided some, but never went away totally. Still hurts on the right side, with occasional pain down the leg. Worse at night, after sitting on her job for 8 hours. Has tried Tylenol, Advil and Aleve - upsetting her stomach. Pain is not alleviated with these, but it is "more manageable." Occasionally has a "catch" in the right buttock region - not often, but it is a sharp pain that she experiences.    HPI: She is here for follow-up low back pain with right posterior hip pain.  Physical therapy helped temporarily but did not give her lasting relief.  She is still doing the exercises.  She tried buying 3 different office chairs until finally finding one that gives her some relief.  She is taking Tylenol and Advil and Aleve to take the edge off her pain but it is causing her stomach to be upset.              ROS:   All other systems were reviewed and are negative.  Objective: Vital Signs: There were no vitals taken for this visit.  Physical Exam:  General:  Alert and oriented, in no acute distress. Pulm:  Breathing unlabored. Psy:  Normal mood, congruent affect.  Low back: She is tender to palpation near the right SI joint and in the right sciatic notch.  Straight leg raise negative, lower extremity strength and reflexes remain normal.  Imaging: No results found.  Assessment & Plan: 1.  Persistent right-sided lower back pain -We will proceed with MRI scan followed by chiropractic or epidural injection or possibly SI joint injection depending on the findings. -Relafen and baclofen as needed.     Procedures: No procedures performed        PMFS  History: Patient Active Problem List   Diagnosis Date Noted  . Class 2 obesity with body mass index (BMI) of 38.0 to 38.9 in adult 09/17/2020  . Chronic lower back pain 12/14/2018  . ADHD 12/14/2018  . Hypothyroidism (acquired) 12/14/2018  . Depression, major, recurrent, in partial remission (HCC) 12/14/2018  . Fall   . Symptomatic bradycardia 07/29/2016  . Hypotension 07/29/2016  . Smoker 07/29/2016  . Bipolar disorder [facility])    Past Medical History:  Diagnosis Date  . Anxiety   . Bipolar disorder (HCC)   . Depression   . History of kidney stones   . Seizures (HCC) ~ 2009   "from taking Wellbutrin"  . Symptomatic bradycardia     History reviewed. No pertinent family history.  Past Surgical History:  Procedure Laterality Date  . COLONOSCOPY  05/2001   Hattie Perch 06/04/2001  . ESOPHAGOGASTRODUODENOSCOPY  11/2002   Hattie Perch 12/05/2002  . EYE SURGERY    . LAPAROSCOPIC CHOLECYSTECTOMY  12/2002  . LITHOTRIPSY  X 1  . STRABISMUS SURGERY  1975; 1997   "both eyes; right eye"  . TONSILLECTOMY  1980   Social History   Occupational History  . Not on file  Tobacco Use  . Smoking status: Former Smoker    Quit date: 10/15/2017  Years since quitting: 3.1  . Smokeless tobacco: Never Used  Vaping Use  . Vaping Use: Never used  Substance and Sexual Activity  . Alcohol use: Yes    Comment: occ  . Drug use: No  . Sexual activity: Not on file

## 2020-12-08 ENCOUNTER — Ambulatory Visit (HOSPITAL_BASED_OUTPATIENT_CLINIC_OR_DEPARTMENT_OTHER)
Admission: RE | Admit: 2020-12-08 | Discharge: 2020-12-08 | Disposition: A | Discharge status: Home / Self Care | Payer: BC Managed Care – PPO | Source: Ambulatory Visit | Attending: Family Medicine | Admitting: Family Medicine

## 2020-12-08 ENCOUNTER — Other Ambulatory Visit: Payer: Self-pay

## 2020-12-08 DIAGNOSIS — M545 Low back pain, unspecified: Secondary | ICD-10-CM | POA: Insufficient documentation

## 2020-12-08 DIAGNOSIS — G8929 Other chronic pain: Secondary | ICD-10-CM | POA: Diagnosis not present

## 2020-12-09 ENCOUNTER — Telehealth: Payer: Self-pay | Admitting: Family Medicine

## 2020-12-09 DIAGNOSIS — M545 Low back pain, unspecified: Secondary | ICD-10-CM

## 2020-12-09 NOTE — Telephone Encounter (Signed)
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  No disc protrusions or nerve impingement.    Could consider referral for facet joint injections.  Or possibly a trial of chiropractic.

## 2020-12-10 NOTE — Addendum Note (Signed)
Addended by: Lillia Carmel on: 12/10/2020 08:01 AM   Modules accepted: Orders

## 2020-12-19 ENCOUNTER — Telehealth: Payer: Self-pay | Admitting: Family Medicine

## 2020-12-19 DIAGNOSIS — E538 Deficiency of other specified B group vitamins: Secondary | ICD-10-CM

## 2020-12-19 NOTE — Telephone Encounter (Signed)
LVM to call back to schedule lab appointment. 

## 2020-12-19 NOTE — Telephone Encounter (Signed)
Patient is calling back and stated that she hadn't been seen in awhile to check her B12 levels and wanted to see if provider could put in orders, please advise. CB is [phone]

## 2020-12-19 NOTE — Telephone Encounter (Signed)
Per last note, pcp wanted B12 levels checked in 3-4 months. Lab order placed, okay to schedule lab appt.

## 2020-12-21 ENCOUNTER — Other Ambulatory Visit (INDEPENDENT_AMBULATORY_CARE_PROVIDER_SITE_OTHER): Payer: BC Managed Care – PPO

## 2020-12-21 ENCOUNTER — Other Ambulatory Visit: Payer: Self-pay

## 2020-12-21 DIAGNOSIS — E538 Deficiency of other specified B group vitamins: Secondary | ICD-10-CM | POA: Diagnosis not present

## 2020-12-21 LAB — VITAMIN B12: Vitamin B-12: 518 pg/mL (ref 211–911)

## 2020-12-26 ENCOUNTER — Ambulatory Visit: Payer: Self-pay

## 2020-12-26 ENCOUNTER — Ambulatory Visit: Payer: BC Managed Care – PPO | Admitting: Physical Medicine and Rehabilitation

## 2020-12-26 ENCOUNTER — Other Ambulatory Visit: Payer: Self-pay

## 2020-12-26 ENCOUNTER — Encounter: Payer: Self-pay | Admitting: Physical Medicine and Rehabilitation

## 2020-12-26 VITALS — BP 102/68 | HR 77

## 2020-12-26 DIAGNOSIS — M47816 Spondylosis without myelopathy or radiculopathy, lumbar region: Secondary | ICD-10-CM

## 2020-12-26 MED ORDER — BETAMETHASONE SOD PHOS & ACET 6 (3-3) MG/ML IJ SUSP
12.0000 mg | Freq: Once | INTRAMUSCULAR | Status: AC
  Administered 2020-12-26: 12 mg

## 2020-12-26 NOTE — Progress Notes (Signed)
[name] - 50 y.o. female MRN [id number]  Date of birth: 1970-08-22  Office Visit Note: Visit Date: 12/26/2020 PCP: [place], Betty G, MD Referred by: [place], Betty G, MD  Subjective: Chief Complaint  Patient presents with   Lower Back - Pain   HPI:  [name] is a 50 y.o. female who comes in today at the request of Dr. Lavada Mesi for planned Right L4-5 and L5-S1 lumbar facet/medial branch block with fluoroscopic guidance.  The patient has failed conservative care including home exercise, medications, time and activity modification.  This injection will be diagnostic and hopefully therapeutic.  Please see requesting physician notes for further details and justification.  Exam has shown concordant pain with facet joint loading. MRI reviewed with images and spine model.  MRI reviewed in the note below.    ROS Otherwise per HPI.  Assessment & Plan: Visit Diagnoses:    ICD-10-CM   1. Spondylosis without myelopathy or radiculopathy, lumbar region  M47.816 XR C-ARM NO REPORT    Facet Injection    betamethasone acetate-betamethasone sodium phosphate (CELESTONE) injection 12 mg      Plan: No additional findings.   Meds & Orders:  Meds ordered this encounter  Medications   betamethasone acetate-betamethasone sodium phosphate (CELESTONE) injection 12 mg    Orders Placed This Encounter  Procedures   Facet Injection   XR C-ARM NO REPORT    Follow-up: Return if symptoms worsen or fail to improve.   Procedures: No procedures performed  Lumbar Facet Joint Intra-Articular Injection(s) with Fluoroscopic Guidance  [name]      Date of Birth: 1971-04-11 MRN: [id number] PCP: [place], Betty G, MD      Visit Date: 12/26/2020   Universal Protocol:    Date/Time: 12/26/2020  Consent Given By: the patient  Position: PRONE   Additional Comments: Vital signs were monitored before and after the procedure. Patient was prepped and draped in the usual sterile  fashion. The correct patient, procedure, and site was verified.   Injection Procedure Details:  Procedure Site One Meds Administered:  Meds ordered this encounter  Medications   betamethasone acetate-betamethasone sodium phosphate (CELESTONE) injection 12 mg     Laterality: Right  Location/Site:  L4-5 L5-S1  Needle size: 22 guage  Needle type: Spinal  Needle Placement: Articular  Findings:  -Comments: Excellent flow of contrast producing a partial arthrogram.  Procedure Details: The fluoroscope beam is vertically oriented in AP, and the inferior recess is visualized beneath the lower pole of the inferior apophyseal process, which represents the target point for needle insertion. When direct visualization is difficult the target point is located at the medial projection of the vertebral pedicle. The region overlying each aforementioned target is locally anesthetized with a 1 to 2 ml. volume of 1% Lidocaine without Epinephrine.   The spinal needle was inserted into each of the above mentioned facet joints using biplanar fluoroscopic guidance. A 0.25 to 0.5 ml. volume of Isovue-250 was injected and a partial facet joint arthrogram was obtained. A single spot film was obtained of the resulting arthrogram.    One to 1.25 ml of the steroid/anesthetic solution was then injected into each of the facet joints noted above.   Additional Comments:  The patient tolerated the procedure well Dressing: 2 x 2 sterile gauze and Band-Aid    Post-procedure details: Patient was observed during the procedure. Post-procedure instructions were reviewed.  Patient left the clinic in stable condition.    Clinical History: MRI LUMBAR SPINE  WITHOUT CONTRAST   TECHNIQUE: Multiplanar, multisequence MR imaging of the lumbar spine was performed. No intravenous contrast was administered.   COMPARISON:  Lumbar spine radiographs 09/17/2020   FINDINGS: Segmentation: There are five lumbar type  vertebral bodies. The last full intervertebral disc space is labeled L5-S1. Small disc space noted at S1-2.   Alignment:  Normal   Vertebrae:  Normal marrow signal.  No bone lesions or fractures.   Conus medullaris and cauda equina: Conus extends to the L1 level. Conus and cauda equina appear normal.   Paraspinal and other soft tissues: No significant paraspinal or retroperitoneal findings.   Disc levels:   L1-2: No significant findings.   L2-3: No significant findings.  Mild facet disease.   L3-4: Mild bulging annulus and mild facet disease but no focal disc protrusions, spinal or foraminal stenosis.   L4-5: Diffuse annular bulge but no focal disc protrusion. There is severe right-sided facet disease and moderate left-sided facet disease but no significant spinal stenosis. Mild bilateral lateral recess encroachment and mild foraminal encroachment.   L5-S1 disc desiccation and degeneration but no focal disc protrusion. Moderate to advanced bilateral facet disease, right greater than left but no significant lateral recess or foraminal stenosis.   IMPRESSION: 1. Mild bilateral lateral recess encroachment and mild bilateral foraminal encroachment at L4-5. 2. Moderate to advanced bilateral facet disease at L5-S1 but no significant lateral recess or foraminal stenosis.     Electronically Signed   By: Rudie Meyer M.D.   On: 12/09/2020 11:16     Objective:  VS:  HT:    WT:   BMI:     BP:102/68  HR:77bpm  TEMP: ( )  RESP:98 % Physical Exam Vitals and nursing note reviewed.  Constitutional:      General: She is not in acute distress.    Appearance: Normal appearance. She is not ill-appearing.  HENT:     Head: Normocephalic and atraumatic.     Right Ear: External ear normal.     Left Ear: External ear normal.  Eyes:     Extraocular Movements: Extraocular movements intact.  Cardiovascular:     Rate and Rhythm: Normal rate.     Pulses: Normal pulses.   Pulmonary:     Effort: Pulmonary effort is normal. No respiratory distress.  Abdominal:     General: There is no distension.     Palpations: Abdomen is soft.  Musculoskeletal:        General: Tenderness present.     Cervical back: Neck supple.     Right lower leg: No edema.     Left lower leg: No edema.     Comments: Patient has good distal strength with no pain over the greater trochanters.  No clonus or focal weakness. Patient somewhat slow to rise from a seated position to full extension.  There is concordant low back pain with facet loading and lumbar spine extension rotation.  There are no definitive trigger points but the patient is somewhat tender across the lower back and PSIS.  There is no pain with hip rotation.   Skin:    Findings: No erythema, lesion or rash.  Neurological:     General: No focal deficit present.     Mental Status: She is alert and oriented to person, place, and time.     Sensory: No sensory deficit.     Motor: No weakness or abnormal muscle tone.     Coordination: Coordination normal.  Psychiatric:  Mood and Affect: Mood normal.        Behavior: Behavior normal.     Imaging: No results found.

## 2020-12-26 NOTE — Progress Notes (Signed)
Numeric Pain Rating Scale and Functional Assessment Average Pain 5   In the last MONTH (on 0-10 scale) has pain interfered with the following?  1. General activity like being  able to carry out your everyday physical activities such as walking, climbing stairs, carrying groceries, or moving a chair?  Rating(8)  Lower back pain on the right side.  Pain stays in the lower back, but does radiate into the right hip.  +Driver, -BT, -Dye Allergies.

## 2020-12-26 NOTE — Patient Instructions (Signed)

## 2021-01-02 DIAGNOSIS — F4322 Adjustment disorder with anxiety: Secondary | ICD-10-CM | POA: Diagnosis not present

## 2021-01-16 DIAGNOSIS — F4322 Adjustment disorder with anxiety: Secondary | ICD-10-CM | POA: Diagnosis not present

## 2021-01-30 DIAGNOSIS — F4322 Adjustment disorder with anxiety: Secondary | ICD-10-CM | POA: Diagnosis not present

## 2021-02-09 NOTE — Procedures (Signed)
Lumbar Facet Joint Intra-Articular Injection(s) with Fluoroscopic Guidance  [name]      Date of Birth: 03/27/1971 MRN: [id number] PCP: [place], Betty G, MD      Visit Date: 12/26/2020   Universal Protocol:    Date/Time: 12/26/2020  Consent Given By: the patient  Position: PRONE   Additional Comments: Vital signs were monitored before and after the procedure. Patient was prepped and draped in the usual sterile fashion. The correct patient, procedure, and site was verified.   Injection Procedure Details:  Procedure Site One Meds Administered:  Meds ordered this encounter  Medications   betamethasone acetate-betamethasone sodium phosphate (CELESTONE) injection 12 mg     Laterality: Right  Location/Site:  L4-5 L5-S1  Needle size: 22 guage  Needle type: Spinal  Needle Placement: Articular  Findings:  -Comments: Excellent flow of contrast producing a partial arthrogram.  Procedure Details: The fluoroscope beam is vertically oriented in AP, and the inferior recess is visualized beneath the lower pole of the inferior apophyseal process, which represents the target point for needle insertion. When direct visualization is difficult the target point is located at the medial projection of the vertebral pedicle. The region overlying each aforementioned target is locally anesthetized with a 1 to 2 ml. volume of 1% Lidocaine without Epinephrine.   The spinal needle was inserted into each of the above mentioned facet joints using biplanar fluoroscopic guidance. A 0.25 to 0.5 ml. volume of Isovue-250 was injected and a partial facet joint arthrogram was obtained. A single spot film was obtained of the resulting arthrogram.    One to 1.25 ml of the steroid/anesthetic solution was then injected into each of the facet joints noted above.   Additional Comments:  The patient tolerated the procedure well Dressing: 2 x 2 sterile gauze and Band-Aid    Post-procedure  details: Patient was observed during the procedure. Post-procedure instructions were reviewed.  Patient left the clinic in stable condition.

## 2021-02-14 DIAGNOSIS — F4322 Adjustment disorder with anxiety: Secondary | ICD-10-CM | POA: Diagnosis not present

## 2021-02-19 ENCOUNTER — Encounter: Payer: Self-pay | Admitting: Family Medicine

## 2021-02-19 ENCOUNTER — Telehealth (INDEPENDENT_AMBULATORY_CARE_PROVIDER_SITE_OTHER): Payer: BC Managed Care – PPO | Admitting: Family Medicine

## 2021-02-19 ENCOUNTER — Other Ambulatory Visit: Payer: Self-pay | Admitting: Internal Medicine

## 2021-02-19 VITALS — HR 88 | Ht 66.0 in

## 2021-02-19 DIAGNOSIS — R059 Cough, unspecified: Secondary | ICD-10-CM | POA: Diagnosis not present

## 2021-02-19 DIAGNOSIS — U071 COVID-19: Secondary | ICD-10-CM | POA: Diagnosis not present

## 2021-02-19 MED ORDER — BENZONATATE 100 MG PO CAPS: 200.0000 mg | ORAL_CAPSULE | Freq: Two times a day (BID) | ORAL | 0 refills | Status: AC | PRN

## 2021-02-19 MED ORDER — MOLNUPIRAVIR EUA 200MG CAPSULE: 4.0000 | ORAL_CAPSULE | Freq: Two times a day (BID) | ORAL | 0 refills | Status: AC

## 2021-02-19 MED ORDER — HYDROCODONE BIT-HOMATROP MBR 5-1.5 MG/5ML PO SOLN: 5.0000 mL | Freq: Two times a day (BID) | ORAL | 0 refills | Status: AC | PRN

## 2021-02-19 NOTE — Progress Notes (Signed)
Virtual Visit via Video Note I connected with [name] on 02/19/21 by a video enabled telemedicine application and verified that I am speaking with the correct person using two identifiers.  Location patient: home Location provider:work office Persons participating in the virtual visit: patient, provider  I discussed the limitations of evaluation and management by telemedicine and the availability of in person appointments. The patient expressed understanding and agreed to proceed.  Chief Complaint  Patient presents with   Covid Positive   HPI: [name] is a 50 year old female with history of anxiety and bipolar disorder reported a positive COVID-19 home test done a couple days ago. She started with symptoms on 02/16/2021.   Fatigue, rhinorrhea, nasal congestion, frontal headache, nonproductive cough, and body aches. Negative for fever, visual changes, anosmia,ageusia,sore throat, wheezing, dyspnea, palpitations, abdominal pain, N/V, changes in bowel habits, urinary symptoms, or skin rash. Cough is keeping her from sleep.  Last week she was in [place] for work and somebody she was in contact with was diagnosed with COVID-19 infection.  She has been taking Tylenol and OTC cough medication. Covid 19 vaccination completed and booster x 1.  ROS: See pertinent positives and negatives per HPI.  Past Medical History:  Diagnosis Date   Anxiety    Bipolar disorder (HCC)    Depression    History of kidney stones    Seizures (HCC) ~ 2009   "from taking Wellbutrin"   Symptomatic bradycardia    Past Surgical History:  Procedure Laterality Date   COLONOSCOPY  05/2001   /notes 06/04/2001   ESOPHAGOGASTRODUODENOSCOPY  11/2002   Hattie Perch 12/05/2002   EYE SURGERY     LAPAROSCOPIC CHOLECYSTECTOMY  12/2002   LITHOTRIPSY  X 1   STRABISMUS SURGERY  1975; 1997   "both eyes; right eye"   TONSILLECTOMY  1980   History reviewed. No pertinent family history.  Social History   Socioeconomic  History   Marital status: Married    Spouse name: Not on file   Number of children: Not on file   Years of education: Not on file   Highest education level: Not on file  Occupational History   Not on file  Tobacco Use   Smoking status: Former    Pack years: 0.00    Types: Cigarettes    Quit date: 10/15/2017    Years since quitting: 3.3   Smokeless tobacco: Never  Vaping Use   Vaping Use: Never used  Substance and Sexual Activity   Alcohol use: Yes    Comment: occ   Drug use: No   Sexual activity: Not on file  Other Topics Concern   Not on file  Social History Narrative   Not on file   Social Determinants of Health   Financial Resource Strain: Not on file  Food Insecurity: Not on file  Transportation Needs: Not on file  Physical Activity: Not on file  Stress: Not on file  Social Connections: Not on file  Intimate Partner Violence: Not on file   Current Outpatient Medications:    baclofen (LIORESAL) 10 MG tablet, Take 0.5-1 tablets (5-10 mg total) by mouth 3 (three) times daily as needed for muscle spasms., Disp: 30 each, Rfl: 3   dexmethylphenidate (FOCALIN) 10 MG tablet, Take 10-20 mg by mouth daily., Disp: , Rfl:    Dexmethylphenidate HCl 35 MG CP24, Take 1 capsule by mouth every morning., Disp: , Rfl:    DULoxetine (CYMBALTA) 60 MG capsule, Take 60 mg by mouth 2 (two) times  daily., Disp: , Rfl:    levothyroxine (SYNTHROID) 25 MCG tablet, Take 25 mcg by mouth daily., Disp: , Rfl:    LORazepam (ATIVAN) 1 MG tablet, Take 1 mg by mouth every 6 (six) hours as needed for anxiety., Disp: , Rfl:    Multiple Vitamin (MULTI-VITAMIN DAILY PO), Take by mouth., Disp: , Rfl:    nabumetone (RELAFEN) 500 MG tablet, Take 1 tablet (500 mg total) by mouth 2 (two) times daily as needed., Disp: 60 tablet, Rfl: 3   OXcarbazepine (TRILEPTAL) 150 MG tablet, Take 300 mg by mouth at bedtime., Disp: , Rfl:    VRAYLAR capsule, Take 3 mg by mouth at bedtime., Disp: , Rfl:    Cariprazine HCl 6 MG  CAPS, Take 6 mg by mouth daily. (Patient not taking: No sig reported), Disp: , Rfl:    celecoxib (CELEBREX) 100 MG capsule, Take 1 capsule (100 mg total) by mouth 2 (two) times daily. (Patient not taking: No sig reported), Disp: 40 capsule, Rfl: 1   methocarbamol (ROBAXIN) 500 MG tablet, Take 1 tablet (500 mg total) by mouth 2 (two) times daily as needed for muscle spasms. (Patient not taking: No sig reported), Disp: 30 tablet, Rfl: 1  EXAM:  VITALS per patient if applicable:Pulse 88   Ht 5\' 6"  (1.676 m)   SpO2 97%   BMI 38.90 kg/m   GENERAL: alert, oriented, appears well and in no acute distress  HEENT: atraumatic, conjunctiva clear, no obvious abnormalities on inspection of external nose and ears  NECK: normal movements of the head and neck  LUNGS: on inspection no signs of respiratory distress, breathing rate appears normal, no obvious gross SOB, gasping or wheezing  CV: no obvious cyanosis  MS: moves all visible extremities without noticeable abnormality  PSYCH/NEURO: pleasant and cooperative, no obvious depression or anxiety, speech and thought processing grossly intact  ASSESSMENT AND PLAN:  Discussed the following assessment and plan:  COVID-19 virus infection We discussed diagnosis and prognosis. Given her past medical history, she does not have high risk for complications. We discussed treatment options, including oral antiviral medication, she agrees with continuing symptomatic treatment. Contact precautions. She is working from home, states that she does not need a note for work ay this time. Plenty of p.o. fluids. Continue Tylenol 500 mg 3 to 4 tablets/day and as needed.  Monitor for new symptoms and continue checking temperature. Instructed about warning signs.  Cough - Plan: HYDROcodone bit-homatropine (HYCODAN) 5-1.5 MG/5ML syrup, benzonatate (TESSALON) 100 MG capsule Explained that cough and congestion can last a few more days and even weeks after acute  symptoms have resolved. I do not think imaging is needed at this time. Symptomatic treatment with benzonatate and Hycodan recommended. Some side effects discussed.   We discussed possible serious and likely etiologies, options for evaluation and workup, limitations of telemedicine visit vs in person visit, treatment, treatment risks and precautions.  I discussed the assessment and treatment plan with the patient. [name] was provided an opportunity to ask questions and all were answered. She agreed with the plan and demonstrated an understanding of the instructions.  Return if symptoms worsen or fail to improve.  Fredis Malkiewicz Terrilee Croak, MD

## 2021-02-20 ENCOUNTER — Telehealth: Payer: BC Managed Care – PPO | Admitting: Family Medicine

## 2021-02-20 DIAGNOSIS — R0602 Shortness of breath: Secondary | ICD-10-CM | POA: Diagnosis not present

## 2021-02-20 DIAGNOSIS — R0789 Other chest pain: Secondary | ICD-10-CM | POA: Diagnosis not present

## 2021-02-20 DIAGNOSIS — R6883 Chills (without fever): Secondary | ICD-10-CM | POA: Diagnosis not present

## 2021-02-20 DIAGNOSIS — R059 Cough, unspecified: Secondary | ICD-10-CM | POA: Diagnosis not present

## 2021-02-21 DIAGNOSIS — R059 Cough, unspecified: Secondary | ICD-10-CM | POA: Diagnosis not present

## 2021-02-22 ENCOUNTER — Telehealth: Payer: Self-pay | Admitting: Family Medicine

## 2021-02-22 MED ORDER — ONDANSETRON HCL 4 MG PO TABS: 4.0000 mg | ORAL_TABLET | Freq: Two times a day (BID) | ORAL | 0 refills | Status: AC | PRN

## 2021-02-22 NOTE — Addendum Note (Signed)
Addended by: [place], Gery Sabedra G on: 02/22/2021 04:34 PM   Modules accepted: Orders

## 2021-02-22 NOTE — Telephone Encounter (Signed)
I called and spoke with pt's spouse. We went over the information below and she verbalized understanding. Dr. [place] is going to send something in for the pt to take for her nausea. Spouse was concerned about pt not finishing the antiviral, advised that they could continue the medication to see if the side effects improved, but if they were causing more side effects than helping, she needed to discontinue it. Patient's spouse is aware that if pt becomes worse and starts vomiting, that they may need to go to the ED.

## 2021-02-22 NOTE — Telephone Encounter (Signed)
[  I saw her for COVID 19 infection on 02/19/21, we discussed symptoms and prognosis. I did not think she needed antiviral medications, decision made based on risks vs benefits.]  CXR done recently was negative for pneumonia. Molnupiravir can cause dizziness and nausea,so if symptoms are moderate to severe she needs to stop medication and continue symptomatic treatment. If symptoms get worse or she starts vomiting, she may need to go to the ER;otherwise we can arrange follow up appt next week. Thanks, BJ

## 2021-02-22 NOTE — Telephone Encounter (Signed)
Pts spouse is calling in stating that she is having a issue w/the Antiviral drug (molnupiravir) and has not been taking her other medications (and want to know if it interact w/her other medications) she is experiencing nausea and dizziness.  She went to [facility] before she was dx w/COVID and they stated that she had mucus pocket and not sure if it is being treated or what they need to do.  Pt would like to have a call back.

## 2021-03-14 DIAGNOSIS — F900 Attention-deficit hyperactivity disorder, predominantly inattentive type: Secondary | ICD-10-CM | POA: Diagnosis not present

## 2021-03-14 DIAGNOSIS — F3178 Bipolar disorder, in full remission, most recent episode mixed: Secondary | ICD-10-CM | POA: Diagnosis not present

## 2021-03-14 DIAGNOSIS — F1011 Alcohol abuse, in remission: Secondary | ICD-10-CM | POA: Diagnosis not present

## 2021-03-14 DIAGNOSIS — G4726 Circadian rhythm sleep disorder, shift work type: Secondary | ICD-10-CM | POA: Diagnosis not present

## 2021-03-29 DIAGNOSIS — F4322 Adjustment disorder with anxiety: Secondary | ICD-10-CM | POA: Diagnosis not present

## 2021-04-29 ENCOUNTER — Telehealth: Payer: Self-pay

## 2021-04-29 ENCOUNTER — Telehealth: Payer: Self-pay | Admitting: Physical Medicine and Rehabilitation

## 2021-04-29 DIAGNOSIS — M47816 Spondylosis without myelopathy or radiculopathy, lumbar region: Secondary | ICD-10-CM

## 2021-04-29 NOTE — Telephone Encounter (Signed)
Pt would like to know if she can get another back injection scheduled with Dr. Alvester Morin

## 2021-04-29 NOTE — Telephone Encounter (Signed)
Patient called. She would like an  appointment with Dr. Alvester Morin. Her call back number is [phone]

## 2021-04-30 NOTE — Telephone Encounter (Signed)
Right L4-5 and L5-S1 facets on 12/26/20. Ok to repeat if helped, same problem/side, and no new injury?

## 2021-04-30 NOTE — Telephone Encounter (Signed)
Good relief from last injection. Referral placed and patient scheduled.

## 2021-05-13 ENCOUNTER — Ambulatory Visit: Payer: Self-pay

## 2021-05-13 ENCOUNTER — Telehealth: Payer: Self-pay | Admitting: Physical Medicine and Rehabilitation

## 2021-05-13 ENCOUNTER — Encounter: Payer: Self-pay | Admitting: Physical Medicine and Rehabilitation

## 2021-05-13 ENCOUNTER — Other Ambulatory Visit: Payer: Self-pay

## 2021-05-13 ENCOUNTER — Ambulatory Visit (INDEPENDENT_AMBULATORY_CARE_PROVIDER_SITE_OTHER): Payer: BC Managed Care – PPO | Admitting: Physical Medicine and Rehabilitation

## 2021-05-13 VITALS — BP 120/80 | HR 80

## 2021-05-13 DIAGNOSIS — M47816 Spondylosis without myelopathy or radiculopathy, lumbar region: Secondary | ICD-10-CM | POA: Diagnosis not present

## 2021-05-13 MED ORDER — METHYLPREDNISOLONE ACETATE 80 MG/ML IJ SUSP
80.0000 mg | Freq: Once | INTRAMUSCULAR | Status: AC
  Administered 2021-05-13: 80 mg

## 2021-05-13 NOTE — Patient Instructions (Signed)

## 2021-05-13 NOTE — Progress Notes (Signed)
[name] - 50 y.o. female MRN [id number]  Date of birth: 21-May-1971  Office Visit Note: Visit Date: 05/13/2021 PCP: [place], Betty G, MD Referred by: [place], Betty G, MD  Subjective: Chief Complaint  Patient presents with   Lower Back - Pain   HPI:  [name] is a 50 y.o. female who comes in today for planned repeat Right L4-5 and L5-S1 Lumbar facet/medial branch block with fluoroscopic guidance.  The patient has failed conservative care including home exercise, medications, time and activity modification.  This injection will be diagnostic and hopefully therapeutic.  Please see requesting physician notes for further details and justification.  Exam shows concordant low back pain with facet joint loading and extension. Patient received more than 80% pain relief from prior injection. This would be the second block in a diagnostic double block paradigm.     Referring:Dr. Casimiro Needle Hilts   ROS Otherwise per HPI.  Assessment & Plan: Visit Diagnoses:    ICD-10-CM   1. Spondylosis without myelopathy or radiculopathy, lumbar region  M47.816 XR C-ARM NO REPORT    Facet Injection    methylPREDNISolone acetate (DEPO-MEDROL) injection 80 mg      Plan: No additional findings.   Meds & Orders:  Meds ordered this encounter  Medications   methylPREDNISolone acetate (DEPO-MEDROL) injection 80 mg    Orders Placed This Encounter  Procedures   Facet Injection   XR C-ARM NO REPORT    Follow-up: Return if symptoms worsen or fail to improve.   Procedures: No procedures performed  Lumbar Diagnostic Facet Joint Nerve Block with Fluoroscopic Guidance   [name]      Date of Birth: [birth year] MRN: [id number] PCP: [place], Betty G, MD      Visit Date: 05/13/2021   Universal Protocol:    Date/Time: 09/30/226:36 PM  Consent Given By: the patient  Position: PRONE  Additional Comments: Vital signs were monitored before and after the procedure. Patient was prepped  and draped in the usual sterile fashion. The correct patient, procedure, and site was verified.   Injection Procedure Details:   Procedure diagnoses:  1. Spondylosis without myelopathy or radiculopathy, lumbar region      Meds Administered:  Meds ordered this encounter  Medications   methylPREDNISolone acetate (DEPO-MEDROL) injection 80 mg     Laterality: Right  Location/Site: L4-L5, L3 and L4 medial branches and L5-S1, L4 medial branch and L5 dorsal ramus  Needle: 5.0 in., 25 ga.  Short bevel or Quincke spinal needle  Needle Placement: Oblique pedical  Findings:   -Comments: There was excellent flow of contrast along the articular pillars without intravascular flow.  Procedure Details: The fluoroscope beam is vertically oriented in AP and then obliqued 15 to 20 degrees to the ipsilateral side of the desired nerve to achieve the "Scotty dog" appearance.  The skin over the target area of the junction of the superior articulating process and the transverse process (sacral ala if blocking the L5 dorsal rami) was locally anesthetized with a 1 ml volume of 1% Lidocaine without Epinephrine.  The spinal needle was inserted and advanced in a trajectory view down to the target.   After contact with periosteum and negative aspirate for blood and CSF, correct placement without intravascular or epidural spread was confirmed by injecting 0.5 ml. of Isovue-250.  A spot radiograph was obtained of this image.    Next, a 0.5 ml. volume of the injectate described above was injected. The needle was then redirected to the other  facet joint nerves mentioned above if needed.  Prior to the procedure, the patient was given a Pain Diary which was completed for baseline measurements.  After the procedure, the patient rated their pain every 30 minutes and will continue rating at this frequency for a total of 5 hours.  The patient has been asked to complete the Diary and return to [place] by mail, fax or hand  delivered as soon as possible.   Additional Comments:  The patient tolerated the procedure well Dressing: 2 x 2 sterile gauze and Band-Aid    Post-procedure details: Patient was observed during the procedure. Post-procedure instructions were reviewed.  Patient left the clinic in stable condition.    Clinical History: MRI LUMBAR SPINE WITHOUT CONTRAST   TECHNIQUE: Multiplanar, multisequence MR imaging of the lumbar spine was performed. No intravenous contrast was administered.   COMPARISON:  Lumbar spine radiographs 09/17/2020   FINDINGS: Segmentation: There are five lumbar type vertebral bodies. The last full intervertebral disc space is labeled L5-S1. Small disc space noted at S1-2.   Alignment:  Normal   Vertebrae:  Normal marrow signal.  No bone lesions or fractures.   Conus medullaris and cauda equina: Conus extends to the L1 level. Conus and cauda equina appear normal.   Paraspinal and other soft tissues: No significant paraspinal or retroperitoneal findings.   Disc levels:   L1-2: No significant findings.   L2-3: No significant findings.  Mild facet disease.   L3-4: Mild bulging annulus and mild facet disease but no focal disc protrusions, spinal or foraminal stenosis.   L4-5: Diffuse annular bulge but no focal disc protrusion. There is severe right-sided facet disease and moderate left-sided facet disease but no significant spinal stenosis. Mild bilateral lateral recess encroachment and mild foraminal encroachment.   L5-S1 disc desiccation and degeneration but no focal disc protrusion. Moderate to advanced bilateral facet disease, right greater than left but no significant lateral recess or foraminal stenosis.   IMPRESSION: 1. Mild bilateral lateral recess encroachment and mild bilateral foraminal encroachment at L4-5. 2. Moderate to advanced bilateral facet disease at L5-S1 but no significant lateral recess or foraminal stenosis.      Electronically Signed   By: Rudie Meyer M.D.   On: 12/09/2020 11:16     Objective:  VS:  HT:    WT:   BMI:     BP:120/80  HR:80bpm  TEMP: ( )  RESP:  Physical Exam Vitals and nursing note reviewed.  Constitutional:      General: She is not in acute distress.    Appearance: Normal appearance. She is obese. She is not ill-appearing.  HENT:     Head: Normocephalic and atraumatic.     Right Ear: External ear normal.     Left Ear: External ear normal.  Eyes:     Extraocular Movements: Extraocular movements intact.  Cardiovascular:     Rate and Rhythm: Normal rate.     Pulses: Normal pulses.  Pulmonary:     Effort: Pulmonary effort is normal. No respiratory distress.  Abdominal:     General: There is no distension.     Palpations: Abdomen is soft.  Musculoskeletal:        General: Tenderness present.     Cervical back: Neck supple.     Right lower leg: No edema.     Left lower leg: No edema.     Comments: Patient has good distal strength with no pain over the greater trochanters.  No clonus or focal  weakness. Patient somewhat slow to rise from a seated position to full extension.  There is concordant low back pain with facet loading and lumbar spine extension rotation.  There are no definitive trigger points but the patient is somewhat tender across the lower back and PSIS.  There is no pain with hip rotation.   Skin:    Findings: No erythema, lesion or rash.  Neurological:     General: No focal deficit present.     Mental Status: She is alert and oriented to person, place, and time.     Sensory: No sensory deficit.     Motor: No weakness or abnormal muscle tone.     Coordination: Coordination normal.  Psychiatric:        Mood and Affect: Mood normal.        Behavior: Behavior normal.     Imaging: No results found.

## 2021-05-13 NOTE — Progress Notes (Signed)
Pt state right lower back pain. Pt state sitting makes the pain worse. Pt state she takes over the counter pain meds and uses heating to help ease her pain. Pt has hx of inj on 12/26/20 pt state it helped.  Numeric Pain Rating Scale and Functional Assessment Average Pain 7   In the last MONTH (on 0-10 scale) has pain interfered with the following?  1. General activity like being  able to carry out your everyday physical activities such as walking, climbing stairs, carrying groceries, or moving a chair?  Rating(8)   +Driver, -BT, -Dye Allergies.

## 2021-05-13 NOTE — Telephone Encounter (Signed)
Patient's significant other called in this afternoon stating that patient is feeling warm and flushed all over body after having lumbar epidural injection today. Patient denies rash or difficulty breathing. This is likely related to steroid used in injection today, informed significant other and patient to continue to monitor, however these symptoms should resolve. Instructed to call back if symptoms do not subside by tomorrow, also instructed to seek emergency care if patient experiences difficulty breathing or chest pain. Dr. Alvester Morin is also aware and agrees with plan.

## 2021-05-15 ENCOUNTER — Other Ambulatory Visit (HOSPITAL_BASED_OUTPATIENT_CLINIC_OR_DEPARTMENT_OTHER): Payer: Self-pay

## 2021-05-17 NOTE — Procedures (Signed)
Lumbar Diagnostic Facet Joint Nerve Block with Fluoroscopic Guidance   [name]      Date of Birth: September 20, 1970 MRN: [id number] PCP: [place], Betty G, MD      Visit Date: 05/13/2021   Universal Protocol:    Date/Time: 09/30/226:36 PM  Consent Given By: the patient  Position: PRONE  Additional Comments: Vital signs were monitored before and after the procedure. Patient was prepped and draped in the usual sterile fashion. The correct patient, procedure, and site was verified.   Injection Procedure Details:   Procedure diagnoses:  1. Spondylosis without myelopathy or radiculopathy, lumbar region      Meds Administered:  Meds ordered this encounter  Medications   methylPREDNISolone acetate (DEPO-MEDROL) injection 80 mg     Laterality: Right  Location/Site: L4-L5, L3 and L4 medial branches and L5-S1, L4 medial branch and L5 dorsal ramus  Needle: 5.0 in., 25 ga.  Short bevel or Quincke spinal needle  Needle Placement: Oblique pedical  Findings:   -Comments: There was excellent flow of contrast along the articular pillars without intravascular flow.  Procedure Details: The fluoroscope beam is vertically oriented in AP and then obliqued 15 to 20 degrees to the ipsilateral side of the desired nerve to achieve the "Scotty dog" appearance.  The skin over the target area of the junction of the superior articulating process and the transverse process (sacral ala if blocking the L5 dorsal rami) was locally anesthetized with a 1 ml volume of 1% Lidocaine without Epinephrine.  The spinal needle was inserted and advanced in a trajectory view down to the target.   After contact with periosteum and negative aspirate for blood and CSF, correct placement without intravascular or epidural spread was confirmed by injecting 0.5 ml. of Isovue-250.  A spot radiograph was obtained of this image.    Next, a 0.5 ml. volume of the injectate described above was injected. The needle was  then redirected to the other facet joint nerves mentioned above if needed.  Prior to the procedure, the patient was given a Pain Diary which was completed for baseline measurements.  After the procedure, the patient rated their pain every 30 minutes and will continue rating at this frequency for a total of 5 hours.  The patient has been asked to complete the Diary and return to [place] by mail, fax or hand delivered as soon as possible.   Additional Comments:  The patient tolerated the procedure well Dressing: 2 x 2 sterile gauze and Band-Aid    Post-procedure details: Patient was observed during the procedure. Post-procedure instructions were reviewed.  Patient left the clinic in stable condition.

## 2021-05-20 DIAGNOSIS — F4322 Adjustment disorder with anxiety: Secondary | ICD-10-CM | POA: Diagnosis not present

## 2021-06-25 ENCOUNTER — Other Ambulatory Visit: Payer: Self-pay

## 2021-06-26 ENCOUNTER — Ambulatory Visit (INDEPENDENT_AMBULATORY_CARE_PROVIDER_SITE_OTHER): Payer: BC Managed Care – PPO | Admitting: Family Medicine

## 2021-06-26 ENCOUNTER — Encounter: Payer: Self-pay | Admitting: Family Medicine

## 2021-06-26 VITALS — BP 120/78 | HR 70 | Temp 98.4°F | Resp 16 | Ht 66.0 in | Wt 241.6 lb

## 2021-06-26 DIAGNOSIS — Z1329 Encounter for screening for other suspected endocrine disorder: Secondary | ICD-10-CM | POA: Diagnosis not present

## 2021-06-26 DIAGNOSIS — Z1159 Encounter for screening for other viral diseases: Secondary | ICD-10-CM

## 2021-06-26 DIAGNOSIS — K219 Gastro-esophageal reflux disease without esophagitis: Secondary | ICD-10-CM

## 2021-06-26 DIAGNOSIS — Z0000 Encounter for general adult medical examination without abnormal findings: Secondary | ICD-10-CM | POA: Diagnosis not present

## 2021-06-26 DIAGNOSIS — Z130 Encounter for screening for diseases of the blood and blood-forming organs and certain disorders involving the immune mechanism: Secondary | ICD-10-CM

## 2021-06-26 DIAGNOSIS — Z13228 Encounter for screening for other metabolic disorders: Secondary | ICD-10-CM

## 2021-06-26 DIAGNOSIS — Z1211 Encounter for screening for malignant neoplasm of colon: Secondary | ICD-10-CM

## 2021-06-26 DIAGNOSIS — R1310 Dysphagia, unspecified: Secondary | ICD-10-CM

## 2021-06-26 DIAGNOSIS — E039 Hypothyroidism, unspecified: Secondary | ICD-10-CM

## 2021-06-26 DIAGNOSIS — E785 Hyperlipidemia, unspecified: Secondary | ICD-10-CM | POA: Diagnosis not present

## 2021-06-26 DIAGNOSIS — E538 Deficiency of other specified B group vitamins: Secondary | ICD-10-CM | POA: Diagnosis not present

## 2021-06-26 DIAGNOSIS — G4730 Sleep apnea, unspecified: Secondary | ICD-10-CM | POA: Diagnosis not present

## 2021-06-26 DIAGNOSIS — F3341 Major depressive disorder, recurrent, in partial remission: Secondary | ICD-10-CM

## 2021-06-26 LAB — HEMOGLOBIN A1C: Hgb A1c MFr Bld: 6.1 % (ref 4.6–6.5)

## 2021-06-26 LAB — COMPREHENSIVE METABOLIC PANEL
ALT: 10 U/L (ref 0–35)
AST: 13 U/L (ref 0–37)
Albumin: 4.3 g/dL (ref 3.5–5.2)
Alkaline Phosphatase: 136 U/L — ABNORMAL HIGH (ref 39–117)
BUN: 15 mg/dL (ref 6–23)
CO2: 27 mEq/L (ref 19–32)
Calcium: 9.3 mg/dL (ref 8.4–10.5)
Chloride: 103 mEq/L (ref 96–112)
Creatinine, Ser: 0.77 mg/dL (ref 0.40–1.20)
GFR: 90.53 mL/min (ref >60.00)
Glucose, Bld: 104 mg/dL — ABNORMAL HIGH (ref 70–99)
Potassium: 4.4 mEq/L (ref 3.5–5.1)
Sodium: 137 mEq/L (ref 135–145)
Total Bilirubin: 0.3 mg/dL (ref 0.2–1.2)
Total Protein: 7.1 g/dL (ref 6.0–8.3)

## 2021-06-26 LAB — LIPID PANEL
Cholesterol: 196 mg/dL (ref 0–200)
HDL: 62.3 mg/dL (ref >39.00)
LDL Cholesterol: 117 mg/dL — ABNORMAL HIGH (ref 0–99)
NonHDL: 134.16
Total CHOL/HDL Ratio: 3
Triglycerides: 84 mg/dL (ref 0.0–149.0)
VLDL: 16.8 mg/dL (ref 0.0–40.0)

## 2021-06-26 LAB — TSH: TSH: 1.29 u[IU]/mL (ref 0.35–5.50)

## 2021-06-26 LAB — VITAMIN B12: Vitamin B-12: 390 pg/mL (ref 211–911)

## 2021-06-26 MED ORDER — OMEPRAZOLE 40 MG PO CPDR: 40.0000 mg | DELAYED_RELEASE_CAPSULE | Freq: Every day | ORAL | 2 refills | Status: DC

## 2021-06-26 NOTE — Progress Notes (Signed)
HPI: [name] is a 50 y.o. female, who is here today with her wife for her routine physical and with come concerns today.  Last CPE: 11/01/19.  Regular exercise 3 or more time per week: Not consistently. Following a healthful diet: Not consistently. She lives with her wife.  Chronic medical problems: Bipolar disorder,hypothyroidism,depression,ADHD,and chronic back pain among some.  Health Maintenance  Topic Date Due   COLONOSCOPY (Pts 45-42yrs Insurance coverage will need to be confirmed)  Never done   HIV Screening  12/14/2023 (Originally 12/26/1986)   PAP SMEAR-Modifier  10/17/2022   TETANUS/TDAP  07/19/2026   INFLUENZA VACCINE  Completed   COVID-19 Vaccine  Completed   Hepatitis C Screening  Completed   HPV VACCINES  Aged Out   Pneumococcal Vaccine 8-58 Years old  Discontinued   Immunization History  Administered Date(s) Administered   Influenza Inj Mdck Quad Pf 09/09/2018   Influenza Nasal 06/01/2008   Influenza Split 09/09/2017   Influenza, Quadrivalent, Recombinant, Inj, Pf 05/19/2019   Influenza,inj,Quad PF,6+ Mos 08/17/2017, 04/29/2020   Influenza-Unspecified 05/19/2015, 05/18/2016, 08/17/2017, 05/22/2021   Moderna Covid-19 Vaccine Bivalent Booster 37yrs & up 05/22/2021   Moderna Sars-Covid-2 Vaccination 11/01/2019, 11/29/2019, 06/29/2020   Tdap 07/19/2016   Colonoscopy 2007 Mammogram: 01/2020 at her gyn's office.  She has some concerns today.  Trouble swallowing ,rice and french fries, dry food. Problem has been going on for 6 month. Heartburn sometimes. Occasionally she takes Prevacid.  Hypothyroidism: She is on Levothyroxine 25 mcg daily. Last TSH 1.3 in 10/2019.  HLD: On non pharmacologic treatments. Component     Latest Ref Rng & Units 11/01/2019  Cholesterol     0 - 200 mg/dL 216 (H)  Triglycerides     0.0 - 149.0 mg/dL 101.0  HDL Cholesterol     >39.00 mg/dL 76.00  VLDL     0.0 - 40.0 mg/dL 20.2  LDL (calc)     0 - 99 mg/dL 119 (H)   Total CHOL/HDL Ratio      3  NonHDL      139.54   Her wife is also concerned about stopping breathing while asleep. She feels rested most of the time when she gets up in the morning.  Negative for morning headaches.B12 deficiency: She is on B12 1000 mcg daily. B12 in 12/2020 was 510.  + Tobacco use, problem exacerbated by stress.  Review of Systems  Constitutional:  Negative for appetite change and fever.  HENT:  Negative for hearing loss, mouth sores and sore throat.   Eyes:  Negative for redness and visual disturbance.  Respiratory:  Negative for cough, shortness of breath and wheezing.   Cardiovascular:  Negative for chest pain and leg swelling.  Gastrointestinal:  Negative for abdominal pain, nausea and vomiting.       No changes in bowel habits.  Endocrine: Negative for cold intolerance, heat intolerance, polydipsia, polyphagia and polyuria.  Genitourinary:  Negative for decreased urine volume, dysuria, hematuria, vaginal bleeding and vaginal discharge.  Musculoskeletal:  Positive for arthralgias and back pain. Negative for gait problem and myalgias.  Skin:  Negative for color change and rash.  Neurological:  Negative for syncope, weakness and headaches.  Hematological:  Negative for adenopathy. Does not bruise/bleed easily.  Psychiatric/Behavioral:  Negative for confusion and hallucinations.   All other systems reviewed and are negative.  Current Outpatient Medications on File Prior to Visit  Medication Sig Dispense Refill   baclofen (LIORESAL) 10 MG tablet Take 0.5-1 tablets (5-10 mg total)  by mouth 3 (three) times daily as needed for muscle spasms. 30 each 3   Cariprazine HCl 6 MG CAPS Take 6 mg by mouth daily.     celecoxib (CELEBREX) 100 MG capsule Take 1 capsule (100 mg total) by mouth 2 (two) times daily. 40 capsule 1   dexmethylphenidate (FOCALIN) 10 MG tablet Take 10-20 mg by mouth daily.     Dexmethylphenidate HCl 35 MG CP24 Take 1 capsule by mouth every morning.      DULoxetine (CYMBALTA) 60 MG capsule Take 60 mg by mouth 2 (two) times daily.     levothyroxine (SYNTHROID) 25 MCG tablet Take 25 mcg by mouth daily.     LORazepam (ATIVAN) 1 MG tablet Take 1 mg by mouth every 6 (six) hours as needed for anxiety.     methocarbamol (ROBAXIN) 500 MG tablet Take 1 tablet (500 mg total) by mouth 2 (two) times daily as needed for muscle spasms. 30 tablet 1   Multiple Vitamin (MULTI-VITAMIN DAILY PO) Take by mouth.     nabumetone (RELAFEN) 500 MG tablet Take 1 tablet (500 mg total) by mouth 2 (two) times daily as needed. 60 tablet 3   OXcarbazepine (TRILEPTAL) 150 MG tablet Take 300 mg by mouth at bedtime.     VRAYLAR capsule Take 3 mg by mouth at bedtime.     No current facility-administered medications on file prior to visit.   Past Medical History:  Diagnosis Date   Anxiety    Bipolar disorder ([place])    Depression    History of kidney stones    Seizures ([place]) ~ 2009   "from taking Wellbutrin"   Symptomatic bradycardia    Past Surgical History:  Procedure Laterality Date   COLONOSCOPY  05/2001   /notes 06/04/2001   ESOPHAGOGASTRODUODENOSCOPY  11/2002   Archie Endo 12/05/2002   EYE SURGERY     LAPAROSCOPIC CHOLECYSTECTOMY  12/2002   LITHOTRIPSY  X 1   STRABISMUS SURGERY  1975; 1997   "both eyes; right eye"   TONSILLECTOMY  1980   Allergies  Allergen Reactions   Ambien [Zolpidem Tartrate]     Seizure    Ciprofloxacin Anaphylaxis   Wellbutrin [Bupropion]     Seizure   History reviewed. No pertinent family history.  Social History   Socioeconomic History   Marital status: Married    Spouse name: Not on file   Number of children: Not on file   Years of education: Not on file   Highest education level: Not on file  Occupational History   Not on file  Tobacco Use   Smoking status: Every Day    Types: Cigarettes    Last attempt to quit: 10/15/2017    Years since quitting: 3.7   Smokeless tobacco: Never  Vaping Use   Vaping Use: Never used   Substance and Sexual Activity   Alcohol use: Yes    Comment: occ   Drug use: No   Sexual activity: Not on file  Other Topics Concern   Not on file  Social History Narrative   Not on file   Social Determinants of Health   Financial Resource Strain: Not on file  Food Insecurity: Not on file  Transportation Needs: Not on file  Physical Activity: Not on file  Stress: Not on file  Social Connections: Not on file   Vitals:   06/26/21 1035  BP: 120/78  Pulse: 70  Resp: 16  Temp: 98.4 F (36.9 C)  SpO2: 98%   Body mass  index is 39 kg/m.  Wt Readings from Last 3 Encounters:  06/26/21 241 lb 9.6 oz (109.6 kg)  09/17/20 241 lb (109.3 kg)  02/22/20 222 lb (100.7 kg)   Physical Exam Vitals and nursing note reviewed.  Constitutional:      General: She is not in acute distress.    Appearance: She is well-developed.  HENT:     Head: Normocephalic and atraumatic.     Right Ear: Hearing, tympanic membrane, ear canal and external ear normal.     Left Ear: Hearing, tympanic membrane, ear canal and external ear normal.     Mouth/Throat:     Mouth: Mucous membranes are moist.     Pharynx: Oropharynx is clear. Uvula midline.  Eyes:     General:        Right eye: No discharge.        Left eye: No discharge.     Conjunctiva/sclera: Conjunctivae normal.     Pupils: Pupils are equal, round, and reactive to light.     Comments: Right eye exophoria.  Neck:     Thyroid: No thyromegaly.     Trachea: No tracheal deviation.  Cardiovascular:     Rate and Rhythm: Normal rate and regular rhythm.     Pulses:          Dorsalis pedis pulses are 2+ on the right side and 2+ on the left side.     Heart sounds: No murmur heard. Pulmonary:     Effort: Pulmonary effort is normal. No respiratory distress.     Breath sounds: Normal breath sounds.  Abdominal:     Palpations: Abdomen is soft. There is no hepatomegaly or mass.     Tenderness: There is no abdominal tenderness.  Genitourinary:     Comments: Deferred to gyn. Musculoskeletal:     Comments: No major deformity or signs of synovitis appreciated.  Lymphadenopathy:     Cervical: No cervical adenopathy.     Upper Body:     Right upper body: No supraclavicular adenopathy.     Left upper body: No supraclavicular adenopathy.  Skin:    General: Skin is warm.     Findings: No erythema or rash.  Neurological:     General: No focal deficit present.     Mental Status: She is alert and oriented to person, place, and time.     Cranial Nerves: No cranial nerve deficit.     Coordination: Coordination normal.     Gait: Gait normal.     Deep Tendon Reflexes:     Reflex Scores:      Bicep reflexes are 2+ on the right side and 2+ on the left side.      Patellar reflexes are 2+ on the right side and 2+ on the left side. Psychiatric:        Speech: Speech normal.     Comments: Well groomed, good eye contact.   ASSESSMENT AND PLAN:  Ms. [place] Pelto was here today annual physical examination and trouble swallowing.  Orders Placed This Encounter  Procedures   Comprehensive metabolic panel   Hemoglobin A1c   Hepatitis C antibody screen   Lipid panel   TSH   Vitamin B12   Ambulatory referral to Gastroenterology   Ambulatory referral to Sleep Studies   Lab Results  Component Value Date   CHOL 196 06/26/2021   HDL 62.30 06/26/2021   LDLCALC 117 (H) 06/26/2021   TRIG 84.0 06/26/2021   CHOLHDL 3 06/26/2021  Lab Results  Component Value Date   CREATININE 0.77 06/26/2021   BUN 15 06/26/2021   NA 137 06/26/2021   K 4.4 06/26/2021   CL 103 06/26/2021   CO2 27 06/26/2021   Lab Results  Component Value Date   ALT 10 06/26/2021   AST 13 06/26/2021   ALKPHOS 136 (H) 06/26/2021   BILITOT 0.3 06/26/2021   Lab Results  Component Value Date   TSH 1.29 06/26/2021   Lab Results  Component Value Date   VITAMINB12 390 06/26/2021   Lab Results  Component Value Date   HGBA1C 6.1 06/26/2021   Routine general medical  examination at a health care facility We discussed the importance of regular physical activity and healthy diet for prevention of chronic illness and/or complications. Preventive guidelines reviewed. Vaccination up to date. Continue her female care with her gyn.  Next CPE in a year.  The 10-year ASCVD risk score (Arnett DK, et al., 2019) is: 2.4%   Values used to calculate the score:     Age: 13 years     Sex: Female     Is Non-Hispanic African American: No     Diabetic: No     Tobacco smoker: Yes     Systolic Blood Pressure: 123456 mmHg     Is BP treated: No     HDL Cholesterol: 62.3 mg/dL     Total Cholesterol: 196 mg/dL  Hypothyroidism (acquired) Continue Levothyroxine 25 mcg daily.  B12 deficiency Continue B12 supplementation same dose, will adjust treatment according to B12 result.  Sleep apnea, unspecified type Witnessed by wife. Wt loss will help. Sleep study will be arranged.  Hyperlipidemia, unspecified hyperlipidemia type Non pharmacologic treatment recommended for now. Further recommendations will be given according to 10 years CVD risk score and lipid panel numbers.  Colon cancer screening -     Ambulatory referral to Gastroenterology  Encounter for HCV screening test for low risk patient -     Hepatitis C antibody screen  Screening for endocrine, metabolic and immunity disorder -     Hemoglobin A1c -     Comprehensive metabolic panel  Dysphagia, unspecified type We discussed possible causes. PPI started today may help. If not resolved, she will need EGD.  Gastroesophageal reflux disease, unspecified whether esophagitis present Omeprazole 40 mg daily x 6-8 weeks. GERD precautions also recommended.  -     omeprazole (PRILOSEC) 40 MG capsule; Take 1 capsule (40 mg total) by mouth daily.  Depression, major, recurrent, in partial remission [facility]) Following with psychiatrist.  Return in about 1 year (around 06/26/2022) for [facility] if needed..  Skye Rodarte G.  [place], MD  [facility]. [place] office.

## 2021-06-26 NOTE — Patient Instructions (Addendum)
A few things to remember from today's visit:  Routine general medical examination at a health care facility  Encounter for HCV screening test for low risk patient - Plan: Hepatitis C antibody screen  Hypothyroidism (acquired) - Plan: TSH  B12 deficiency - Plan: Vitamin B12  Sleep apnea, unspecified type - Plan: Ambulatory referral to Sleep Studies  Hyperlipidemia, unspecified hyperlipidemia type - Plan: Lipid panel  Colon cancer screening - Plan: Ambulatory referral to Gastroenterology  Screening for endocrine, metabolic and immunity disorder - Plan: Comprehensive metabolic panel, Hemoglobin A1c  Dysphagia, unspecified type  If you need refills please call your pharmacy. Do not use My Chart to request refills or for acute issues that need immediate attention.   Omeprazole 40 mg for 8 weeks, if swallowing is not better we will nee gastro evaluation. Wt loss will help with sleep apnea.  Please be sure medication list is accurate. If a new problem present, please set up appointment sooner than planned today.  Health Maintenance, Female Adopting a healthy lifestyle and getting preventive care are important in promoting health and wellness. Ask your health care provider about: The right schedule for you to have regular tests and exams. Things you can do on your own to prevent diseases and keep yourself healthy. What should I know about diet, weight, and exercise? Eat a healthy diet  Eat a diet that includes plenty of vegetables, fruits, low-fat dairy products, and lean protein. Do not eat a lot of foods that are high in solid fats, added sugars, or sodium. Maintain a healthy weight Body mass index (BMI) is used to identify weight problems. It estimates body fat based on height and weight. Your health care provider can help determine your BMI and help you achieve or maintain a healthy weight. Get regular exercise Get regular exercise. This is one of the most important things you  can do for your health. Most adults should: Exercise for at least 150 minutes each week. The exercise should increase your heart rate and make you sweat (moderate-intensity exercise). Do strengthening exercises at least twice a week. This is in addition to the moderate-intensity exercise. Spend less time sitting. Even light physical activity can be beneficial. Watch cholesterol and blood lipids Have your blood tested for lipids and cholesterol at 49 years of age, then have this test every 5 years. Have your cholesterol levels checked more often if: Your lipid or cholesterol levels are high. You are older than 49 years of age. You are at high risk for heart disease. What should I know about cancer screening? Depending on your health history and family history, you may need to have cancer screening at various ages. This may include screening for: Breast cancer. Cervical cancer. Colorectal cancer. Skin cancer. Lung cancer. What should I know about heart disease, diabetes, and high blood pressure? Blood pressure and heart disease High blood pressure causes heart disease and increases the risk of stroke. This is more likely to develop in people who have high blood pressure readings or are overweight. Have your blood pressure checked: Every 3-5 years if you are 79-73 years of age. Every year if you are 52 years old or older. Diabetes Have regular diabetes screenings. This checks your fasting blood sugar level. Have the screening done: Once every three years after age 10 if you are at a normal weight and have a low risk for diabetes. More often and at a younger age if you are overweight or have a high risk for diabetes. What  should I know about preventing infection? Hepatitis B If you have a higher risk for hepatitis B, you should be screened for this virus. Talk with your health care provider to find out if you are at risk for hepatitis B infection. Hepatitis C Testing is recommended  for: Everyone born from [birth year] through 1964. Anyone with known risk factors for hepatitis C. Sexually transmitted infections (STIs) Get screened for STIs, including gonorrhea and chlamydia, if: You are sexually active and are younger than 49 years of age. You are older than 49 years of age and your health care provider tells you that you are at risk for this type of infection. Your sexual activity has changed since you were last screened, and you are at increased risk for chlamydia or gonorrhea. Ask your health care provider if you are at risk. Ask your health care provider about whether you are at high risk for HIV. Your health care provider may recommend a prescription medicine to help prevent HIV infection. If you choose to take medicine to prevent HIV, you should first get tested for HIV. You should then be tested every 3 months for as long as you are taking the medicine. Pregnancy If you are about to stop having your period (premenopausal) and you may become pregnant, seek counseling before you get pregnant. Take 400 to 800 micrograms (mcg) of folic acid every day if you become pregnant. Ask for birth control (contraception) if you want to prevent pregnancy. Osteoporosis and menopause Osteoporosis is a disease in which the bones lose minerals and strength with aging. This can result in bone fractures. If you are 42 years old or older, or if you are at risk for osteoporosis and fractures, ask your health care provider if you should: Be screened for bone loss. Take a calcium or vitamin D supplement to lower your risk of fractures. Be given hormone replacement therapy (HRT) to treat symptoms of menopause. Follow these instructions at home: Alcohol use Do not drink alcohol if: Your health care provider tells you not to drink. You are pregnant, may be pregnant, or are planning to become pregnant. If you drink alcohol: Limit how much you have to: 0-1 drink a day. Know how much alcohol is in  your drink. In the [place] one drink equals one 12 oz bottle of beer (355 mL), one 5 oz glass of wine (148 mL), or one 1 oz glass of hard liquor (44 mL). Lifestyle Do not use any products that contain nicotine or tobacco. These products include cigarettes, chewing tobacco, and vaping devices, such as e-cigarettes. If you need help quitting, ask your health care provider. Do not use street drugs. Do not share needles. Ask your health care provider for help if you need support or information about quitting drugs. General instructions Schedule regular health, dental, and eye exams. Stay current with your vaccines. Tell your health care provider if: You often feel depressed. You have ever been abused or do not feel safe at home. Summary Adopting a healthy lifestyle and getting preventive care are important in promoting health and wellness. Follow your health care provider's instructions about healthy diet, exercising, and getting tested or screened for diseases. Follow your health care provider's instructions on monitoring your cholesterol and blood pressure. This information is not intended to replace advice given to you by your health care provider. Make sure you discuss any questions you have with your health care provider. Document Revised: 12/24/2020 Document Reviewed: 12/24/2020 Elsevier Patient Education  2022 [employer].

## 2021-06-27 LAB — HEPATITIS C ANTIBODY
Hepatitis C Ab: NONREACTIVE
SIGNAL TO CUT-OFF: 0.08 (ref <1.00)

## 2021-06-29 ENCOUNTER — Encounter: Payer: Self-pay | Admitting: Family Medicine

## 2021-06-29 DIAGNOSIS — M064 Inflammatory polyarthropathy: Secondary | ICD-10-CM | POA: Insufficient documentation

## 2021-07-02 ENCOUNTER — Ambulatory Visit: Payer: BC Managed Care – PPO

## 2021-07-02 ENCOUNTER — Telehealth: Payer: Self-pay | Admitting: Physical Medicine and Rehabilitation

## 2021-07-02 DIAGNOSIS — M47816 Spondylosis without myelopathy or radiculopathy, lumbar region: Secondary | ICD-10-CM

## 2021-07-02 NOTE — Telephone Encounter (Signed)
Pt called and states that her back pain has came back and she was wondering where to go from here? She states [facility] said something about a nerve abrasion.   CB [phone]

## 2021-07-03 ENCOUNTER — Telehealth: Payer: Self-pay | Admitting: Physical Medicine and Rehabilitation

## 2021-07-03 NOTE — Telephone Encounter (Signed)
Pt. Returning call stating back pain is exactly the same as before in the exact same location

## 2021-07-03 NOTE — Telephone Encounter (Signed)
Patient aware that we will call to schedule when RFA has been authorized by her insurance.

## 2021-07-03 NOTE — Addendum Note (Signed)
Addended by: Cindie Crumbly B on: 07/03/2021 04:49 PM   Modules accepted: Orders

## 2021-07-03 NOTE — Telephone Encounter (Signed)
Placed referral for auth for right L4-5 and L5-S1 RFA.

## 2021-07-03 NOTE — Telephone Encounter (Signed)
Duplicate message in chart.  

## 2021-07-03 NOTE — Telephone Encounter (Signed)
Patient has had second set of medial branch blocks (in September). Left message asking patient to call us back to let us know how well those injections worked.

## 2021-07-03 NOTE — Telephone Encounter (Signed)
Patient called back and left message that pain was exactly the same as it was before. I called her to see if she got relief from the medial branch blocks in September. She states that she got 90% relief for about 6 weeks with those injections. The pain is now back, in the same place, and is the same as it was before. I advised I would get all of this information to Toni Amend so that she can discuss with Dr. Alvester Morin and find out what the next steps are. She is aware that you all are in clinic this afternoon.

## 2021-07-09 ENCOUNTER — Telehealth: Payer: Self-pay | Admitting: Physical Medicine and Rehabilitation

## 2021-07-09 DIAGNOSIS — M47816 Spondylosis without myelopathy or radiculopathy, lumbar region: Secondary | ICD-10-CM

## 2021-07-09 MED ORDER — TRAMADOL HCL 50 MG PO TABS: 50.0000 mg | ORAL_TABLET | Freq: Three times a day (TID) | ORAL | 0 refills | Status: AC | PRN

## 2021-07-09 NOTE — Telephone Encounter (Signed)
Pt wife called and was wondering if in the meantime of her waiting for the abrasion to be approved, if she can get some type of pain medication prescribed.   CB [phone]

## 2021-07-09 NOTE — Telephone Encounter (Signed)
Left message to advise that prescription was sent.

## 2021-07-09 NOTE — Addendum Note (Signed)
Addended by: Ashok Norris on: 07/09/2021 04:02 PM   Modules accepted: Orders

## 2021-07-09 NOTE — Telephone Encounter (Signed)
Please advise 

## 2021-07-17 ENCOUNTER — Other Ambulatory Visit: Payer: Self-pay | Admitting: Physical Medicine and Rehabilitation

## 2021-07-17 ENCOUNTER — Telehealth: Payer: Self-pay | Admitting: Physical Medicine and Rehabilitation

## 2021-07-17 NOTE — Telephone Encounter (Signed)
Pt's wife Corrie Dandy called requesting stronger medication. Pt is asking for percocets. Please send to pharmacy on file. Pt phone number is [phone].

## 2021-07-18 DIAGNOSIS — F4322 Adjustment disorder with anxiety: Secondary | ICD-10-CM | POA: Diagnosis not present

## 2021-07-22 ENCOUNTER — Encounter: Payer: Self-pay | Admitting: Family Medicine

## 2021-07-22 DIAGNOSIS — Z1231 Encounter for screening mammogram for malignant neoplasm of breast: Secondary | ICD-10-CM | POA: Diagnosis not present

## 2021-07-22 DIAGNOSIS — Z6838 Body mass index (BMI) 38.0-38.9, adult: Secondary | ICD-10-CM | POA: Diagnosis not present

## 2021-07-22 DIAGNOSIS — Z01419 Encounter for gynecological examination (general) (routine) without abnormal findings: Secondary | ICD-10-CM | POA: Diagnosis not present

## 2021-07-22 LAB — HM MAMMOGRAPHY

## 2021-07-25 DIAGNOSIS — K5900 Constipation, unspecified: Secondary | ICD-10-CM | POA: Diagnosis not present

## 2021-07-25 DIAGNOSIS — Z1211 Encounter for screening for malignant neoplasm of colon: Secondary | ICD-10-CM | POA: Diagnosis not present

## 2021-07-26 ENCOUNTER — Other Ambulatory Visit: Payer: Self-pay | Admitting: Physical Medicine and Rehabilitation

## 2021-07-29 ENCOUNTER — Other Ambulatory Visit: Payer: Self-pay | Admitting: Physical Medicine and Rehabilitation

## 2021-07-29 MED ORDER — TRAMADOL HCL 50 MG PO TABS: 50.0000 mg | ORAL_TABLET | Freq: Three times a day (TID) | ORAL | 0 refills | Status: AC | PRN

## 2021-08-05 ENCOUNTER — Ambulatory Visit (INDEPENDENT_AMBULATORY_CARE_PROVIDER_SITE_OTHER): Payer: BC Managed Care – PPO | Admitting: Physical Medicine and Rehabilitation

## 2021-08-05 ENCOUNTER — Ambulatory Visit: Payer: Self-pay

## 2021-08-05 ENCOUNTER — Encounter: Payer: Self-pay | Admitting: Physical Medicine and Rehabilitation

## 2021-08-05 ENCOUNTER — Other Ambulatory Visit: Payer: Self-pay

## 2021-08-05 VITALS — BP 111/70 | HR 87

## 2021-08-05 DIAGNOSIS — M47816 Spondylosis without myelopathy or radiculopathy, lumbar region: Secondary | ICD-10-CM

## 2021-08-05 MED ORDER — METHYLPREDNISOLONE ACETATE 80 MG/ML IJ SUSP
80.0000 mg | Freq: Once | INTRAMUSCULAR | Status: AC
  Administered 2021-08-05: 17:00:00 80 mg

## 2021-08-05 NOTE — Progress Notes (Signed)
[name] - 50 y.o. female MRN [id number]  Date of birth: 1971/01/24  Office Visit Note: Visit Date: 08/05/2021 PCP: [place], Betty G, MD Referred by: [place], Betty G, MD  Subjective: Chief Complaint  Patient presents with   Lower Back - Pain   HPI:  [name] is a 50 y.o. female who comes in todayfor planned radiofrequency ablation of the Right L4-5 and L5-S1 Lumbar facet joints. This would be ablation of the corresponding medial branches and/or dorsal rami.  Patient has had double diagnostic blocks with more than 50% relief.  These are documented on pain diary.  They have had chronic back pain for quite some time, more than 3 months, which has been an ongoing situation with recalcitrant axial back pain.  They have no radicular pain.  Their axial pain is worse with standing and ambulating and on exam today with facet loading.  They have had physical therapy as well as home exercise program.  The imaging noted in the chart below indicated facet pathology. Accordingly they meet all the criteria and qualification for for radiofrequency ablation and we are going to complete this today hopefully for more longer term relief as part of comprehensive management program.   ROS Otherwise per HPI.  Assessment & Plan: Visit Diagnoses:    ICD-10-CM   1. Spondylosis without myelopathy or radiculopathy, lumbar region  M47.816 XR C-ARM NO REPORT    Radiofrequency,Lumbar    methylPREDNISolone acetate (DEPO-MEDROL) injection 80 mg      Plan: No additional findings.   Meds & Orders:  Meds ordered this encounter  Medications   methylPREDNISolone acetate (DEPO-MEDROL) injection 80 mg    Orders Placed This Encounter  Procedures   Radiofrequency,Lumbar   XR C-ARM NO REPORT    Follow-up: Return if symptoms worsen or fail to improve.   Procedures: No procedures performed  Lumbar Facet Joint Nerve Denervation  [name]      Date of Birth: 1970/10/16 MRN: [id number] PCP:  [place], Betty G, MD      Visit Date: 08/05/2021   Universal Protocol:    Date/Time: 12/19/224:44 PM  Consent Given By: the patient  Position: PRONE  Additional Comments: Vital signs were monitored before and after the procedure. Patient was prepped and draped in the usual sterile fashion. The correct patient, procedure, and site was verified.   Injection Procedure Details:   Procedure diagnoses:  1. Spondylosis without myelopathy or radiculopathy, lumbar region      Meds Administered:  Meds ordered this encounter  Medications   methylPREDNISolone acetate (DEPO-MEDROL) injection 80 mg     Laterality: Right  Location/Site:  L4-L5, L3 and L4 medial branches and L5-S1, L4 medial branch and L5 dorsal ramus  Needle: 18 ga.,  83mm active tip, RF Cannula  Needle Placement: Along juncture of superior articular process and transverse pocess  Findings:  -Comments:  Procedure Details: For each desired target nerve, the corresponding transverse process (sacral ala for the L5 dorsal rami) was identified and the fluoroscope was positioned to square off the endplates of the corresponding vertebral body to achieve a true AP midline view.  The beam was then obliqued 15 to 20 degrees and caudally tilted 15 to 20 degrees to line up a trajectory along the target nerves. The skin over the target of the junction of superior articulating process and transverse process (sacral ala for the L5 dorsal rami) was infiltrated with 28ml of 1% Lidocaine without Epinephrine.  The 18 gauge 103mm active tip  outer cannula was advanced in trajectory view to the target.  This procedure was repeated for each target nerve.  Then, for all levels, the outer cannula placement was fine-tuned and the position was then confirmed with bi-planar imaging.    Test stimulation was done both at sensory and motor levels to ensure there was no radicular stimulation. The target tissues were then infiltrated with 1 ml of 1%  Lidocaine without Epinephrine. Subsequently, a percutaneous neurotomy was carried out for 90 seconds at 80 degrees Celsius.  After the completion of the lesion, 1 ml of injectate was delivered. It was then repeated for each facet joint nerve mentioned above. Appropriate radiographs were obtained to verify the probe placement during the neurotomy.   Additional Comments:  The patient tolerated the procedure well Dressing: 2 x 2 sterile gauze and Band-Aid    Post-procedure details: Patient was observed during the procedure. Post-procedure instructions were reviewed.  Patient left the clinic in stable condition.       Clinical History: MRI LUMBAR SPINE WITHOUT CONTRAST   TECHNIQUE: Multiplanar, multisequence MR imaging of the lumbar spine was performed. No intravenous contrast was administered.   COMPARISON:  Lumbar spine radiographs 09/17/2020   FINDINGS: Segmentation: There are five lumbar type vertebral bodies. The last full intervertebral disc space is labeled L5-S1. Small disc space noted at S1-2.   Alignment:  Normal   Vertebrae:  Normal marrow signal.  No bone lesions or fractures.   Conus medullaris and cauda equina: Conus extends to the L1 level. Conus and cauda equina appear normal.   Paraspinal and other soft tissues: No significant paraspinal or retroperitoneal findings.   Disc levels:   L1-2: No significant findings.   L2-3: No significant findings.  Mild facet disease.   L3-4: Mild bulging annulus and mild facet disease but no focal disc protrusions, spinal or foraminal stenosis.   L4-5: Diffuse annular bulge but no focal disc protrusion. There is severe right-sided facet disease and moderate left-sided facet disease but no significant spinal stenosis. Mild bilateral lateral recess encroachment and mild foraminal encroachment.   L5-S1 disc desiccation and degeneration but no focal disc protrusion. Moderate to advanced bilateral facet disease,  right greater than left but no significant lateral recess or foraminal stenosis.   IMPRESSION: 1. Mild bilateral lateral recess encroachment and mild bilateral foraminal encroachment at L4-5. 2. Moderate to advanced bilateral facet disease at L5-S1 but no significant lateral recess or foraminal stenosis.     Electronically Signed   By: Rudie Meyer M.D.   On: 12/09/2020 11:16     Objective:  VS:  HT:     WT:    BMI:      BP:111/70   HR:87bpm   TEMP: ( )   RESP:  Physical Exam Vitals and nursing note reviewed.  Constitutional:      General: She is not in acute distress.    Appearance: Normal appearance. She is not ill-appearing.  HENT:     Head: Normocephalic and atraumatic.     Right Ear: External ear normal.     Left Ear: External ear normal.  Eyes:     Extraocular Movements: Extraocular movements intact.  Cardiovascular:     Rate and Rhythm: Normal rate.     Pulses: Normal pulses.  Pulmonary:     Effort: Pulmonary effort is normal. No respiratory distress.  Abdominal:     General: There is no distension.     Palpations: Abdomen is soft.  Musculoskeletal:  General: Tenderness present.     Cervical back: Neck supple.     Right lower leg: No edema.     Left lower leg: No edema.     Comments: Patient has good distal strength with no pain over the greater trochanters.  No clonus or focal weakness. Patient somewhat slow to rise from a seated position to full extension.  There is concordant low back pain with facet loading and lumbar spine extension rotation.  There are no definitive trigger points but the patient is somewhat tender across the lower back and PSIS.  There is no pain with hip rotation.   Skin:    Findings: No erythema, lesion or rash.  Neurological:     General: No focal deficit present.     Mental Status: She is alert and oriented to person, place, and time.     Sensory: No sensory deficit.     Motor: No weakness or abnormal muscle tone.      Coordination: Coordination normal.  Psychiatric:        Mood and Affect: Mood normal.        Behavior: Behavior normal.     Imaging: No results found.

## 2021-08-05 NOTE — Procedures (Signed)
Lumbar Facet Joint Nerve Denervation  [name]      Date of Birth: 1970/11/17 MRN: [id number] PCP: [place], Betty G, MD      Visit Date: 08/05/2021   Universal Protocol:    Date/Time: 12/19/224:44 PM  Consent Given By: the patient  Position: PRONE  Additional Comments: Vital signs were monitored before and after the procedure. Patient was prepped and draped in the usual sterile fashion. The correct patient, procedure, and site was verified.   Injection Procedure Details:   Procedure diagnoses:  1. Spondylosis without myelopathy or radiculopathy, lumbar region      Meds Administered:  Meds ordered this encounter  Medications   methylPREDNISolone acetate (DEPO-MEDROL) injection 80 mg     Laterality: Right  Location/Site:  L4-L5, L3 and L4 medial branches and L5-S1, L4 medial branch and L5 dorsal ramus  Needle: 18 ga.,  30mm active tip, RF Cannula  Needle Placement: Along juncture of superior articular process and transverse pocess  Findings:  -Comments:  Procedure Details: For each desired target nerve, the corresponding transverse process (sacral ala for the L5 dorsal rami) was identified and the fluoroscope was positioned to square off the endplates of the corresponding vertebral body to achieve a true AP midline view.  The beam was then obliqued 15 to 20 degrees and caudally tilted 15 to 20 degrees to line up a trajectory along the target nerves. The skin over the target of the junction of superior articulating process and transverse process (sacral ala for the L5 dorsal rami) was infiltrated with 16ml of 1% Lidocaine without Epinephrine.  The 18 gauge 33mm active tip outer cannula was advanced in trajectory view to the target.  This procedure was repeated for each target nerve.  Then, for all levels, the outer cannula placement was fine-tuned and the position was then confirmed with bi-planar imaging.    Test stimulation was done both at sensory and  motor levels to ensure there was no radicular stimulation. The target tissues were then infiltrated with 1 ml of 1% Lidocaine without Epinephrine. Subsequently, a percutaneous neurotomy was carried out for 90 seconds at 80 degrees Celsius.  After the completion of the lesion, 1 ml of injectate was delivered. It was then repeated for each facet joint nerve mentioned above. Appropriate radiographs were obtained to verify the probe placement during the neurotomy.   Additional Comments:  The patient tolerated the procedure well Dressing: 2 x 2 sterile gauze and Band-Aid    Post-procedure details: Patient was observed during the procedure. Post-procedure instructions were reviewed.  Patient left the clinic in stable condition.

## 2021-08-05 NOTE — Patient Instructions (Signed)

## 2021-08-05 NOTE — Progress Notes (Signed)
Pt state right lower back pain. Pt state sitting makes the pain worse. Pt state she takes over the counter pain meds and uses heating to help ease her pain.  Numeric Pain Rating Scale and Functional Assessment Average Pain 7   In the last MONTH (on 0-10 scale) has pain interfered with the following?  1. General activity like being  able to carry out your everyday physical activities such as walking, climbing stairs, carrying groceries, or moving a chair?  Rating(9)   +Driver, -BT, -Dye Allergies.

## 2021-09-17 DIAGNOSIS — H5015 Alternating exotropia: Secondary | ICD-10-CM | POA: Diagnosis not present

## 2021-09-17 DIAGNOSIS — Z9889 Other specified postprocedural states: Secondary | ICD-10-CM | POA: Diagnosis not present

## 2021-09-17 DIAGNOSIS — H5509 Other forms of nystagmus: Secondary | ICD-10-CM | POA: Diagnosis not present

## 2021-09-19 ENCOUNTER — Other Ambulatory Visit: Payer: Self-pay | Admitting: Family Medicine

## 2021-09-19 DIAGNOSIS — K219 Gastro-esophageal reflux disease without esophagitis: Secondary | ICD-10-CM

## 2021-09-23 DIAGNOSIS — F3177 Bipolar disorder, in partial remission, most recent episode mixed: Secondary | ICD-10-CM | POA: Diagnosis not present

## 2021-09-23 DIAGNOSIS — F1011 Alcohol abuse, in remission: Secondary | ICD-10-CM | POA: Diagnosis not present

## 2021-09-23 DIAGNOSIS — F900 Attention-deficit hyperactivity disorder, predominantly inattentive type: Secondary | ICD-10-CM | POA: Diagnosis not present

## 2021-09-23 DIAGNOSIS — G4726 Circadian rhythm sleep disorder, shift work type: Secondary | ICD-10-CM | POA: Diagnosis not present

## 2021-10-02 ENCOUNTER — Ambulatory Visit (INDEPENDENT_AMBULATORY_CARE_PROVIDER_SITE_OTHER): Payer: BC Managed Care – PPO

## 2021-10-02 DIAGNOSIS — E538 Deficiency of other specified B group vitamins: Secondary | ICD-10-CM

## 2021-10-02 MED ORDER — CYANOCOBALAMIN 1000 MCG/ML IJ SOLN
1000.0000 ug | Freq: Once | INTRAMUSCULAR | Status: AC
  Administered 2021-10-02: 1000 ug via INTRAMUSCULAR

## 2021-10-02 NOTE — Progress Notes (Signed)
Per orders of Dr. Jordan, injection of B12 given by Afsheen Antony E Ralphie Lovelady. Patient tolerated injection well.  

## 2021-10-21 ENCOUNTER — Telehealth: Payer: Self-pay

## 2021-10-21 NOTE — Telephone Encounter (Signed)
-  caller states patient is having shortness of breath. ?recently started saying she felt like something is wrong ?this weekend. patient is not at home. she is at work ?right now. will attempt to call patient for triage. no ?answer. will call spouse back and let her know so she ?can schedule appointment. ? ?10/21/2021 10:11:54 AM Clinical Call  [place], RN, Marchelle Folks ? ?10/21/21 1227: Attempted to call pt to check on her & possibly schedule appt.  ? ? ?

## 2021-10-22 NOTE — Telephone Encounter (Signed)
Patient has an appointment with pcp.  ?

## 2021-10-23 NOTE — Progress Notes (Signed)
ACUTE VISIT Chief Complaint  Patient presents with   Shortness of Breath    Intermittent when doing a lot of walking.    HPI: [name] is a 51 y.o. female with history of hypothyroidism, GERD, seizure disorder,depression, ADHD, and bipolar disorder here today with her wife complaining of 2 weeks of DOE. She has been working from home until 2 weeks ago, started going to the office a few times per week. She started walking during lunch with her coworkers, not fast,noted SOB after walking a block. No associated symptoms except for occasional lightheadedness.  She has continued walking during lunch breaks, has lost some wt. She feels like SOB is not as bad but still present. Yesterday she got SOB when lifting laundry.  Shortness of Breath This is a new problem. The current episode started 1 to 4 weeks ago. The problem occurs intermittently. The problem has been gradually improving. Associated symptoms include wheezing. Pertinent negatives include no abdominal pain, chest pain, claudication, fever, headaches, hemoptysis, leg pain, leg swelling, orthopnea, PND, rhinorrhea, sore throat, sputum production, swollen glands, syncope or vomiting. The symptoms are aggravated by exercise. She has tried nothing for the symptoms. Her past medical history is significant for allergies.  Lab Results  Component Value Date   CHOL 196 06/26/2021   HDL 62.30 06/26/2021   LDLCALC 117 (H) 06/26/2021   TRIG 84.0 06/26/2021   CHOLHDL 3 06/26/2021   Sometimes she feels like her heart "flutters", this has been going on for years, stable,and not associated with exertion. Occasional wheezing, not associated with exertion, intermittently for a while. She has not smoked in 3 weeks.  Her wife has witness sleep apneas, she states that she has not received information about sleep study. Last blood work in 06/2021, alk phosphatase was elevated. Lab Results  Component Value Date   ALT 10 06/26/2021   AST 13  06/26/2021   ALKPHOS 136 (H) 06/26/2021   BILITOT 0.3 06/26/2021   Lab Results  Component Value Date   CREATININE 0.77 06/26/2021   BUN 15 06/26/2021   NA 137 06/26/2021   K 4.4 06/26/2021   CL 103 06/26/2021   CO2 27 06/26/2021   Review of Systems  Constitutional:  Negative for appetite change, chills and fever.  HENT:  Negative for rhinorrhea and sore throat.   Respiratory:  Positive for shortness of breath and wheezing. Negative for hemoptysis and sputum production.   Cardiovascular:  Negative for chest pain, orthopnea, claudication, leg swelling, syncope and PND.  Gastrointestinal:  Negative for abdominal pain and vomiting.  Neurological:  Negative for syncope, weakness and headaches.  Rest see pertinent positives and negatives per HPI.  Current Outpatient Medications on File Prior to Visit  Medication Sig Dispense Refill   baclofen (LIORESAL) 10 MG tablet Take 0.5-1 tablets (5-10 mg total) by mouth 3 (three) times daily as needed for muscle spasms. 30 each 3   Cariprazine HCl 6 MG CAPS Take 6 mg by mouth daily.     celecoxib (CELEBREX) 100 MG capsule Take 1 capsule (100 mg total) by mouth 2 (two) times daily. 40 capsule 1   dexmethylphenidate (FOCALIN) 10 MG tablet Take 10-20 mg by mouth daily.     Dexmethylphenidate HCl 35 MG CP24 Take 1 capsule by mouth every morning.     DULoxetine (CYMBALTA) 60 MG capsule Take 60 mg by mouth 2 (two) times daily.     levothyroxine (SYNTHROID) 25 MCG tablet Take 25 mcg by mouth daily.  LORazepam (ATIVAN) 1 MG tablet Take 1 mg by mouth every 6 (six) hours as needed for anxiety.     methocarbamol (ROBAXIN) 500 MG tablet Take 1 tablet (500 mg total) by mouth 2 (two) times daily as needed for muscle spasms. 30 tablet 1   Multiple Vitamin (MULTI-VITAMIN DAILY PO) Take by mouth.     nabumetone (RELAFEN) 500 MG tablet Take 1 tablet (500 mg total) by mouth 2 (two) times daily as needed. 60 tablet 3   omeprazole (PRILOSEC) 40 MG capsule TAKE 1  CAPSULE (40 MG TOTAL) BY MOUTH DAILY. 90 capsule 3   OXcarbazepine (TRILEPTAL) 150 MG tablet Take 300 mg by mouth at bedtime.     traMADol (ULTRAM) 50 MG tablet Take 1 tablet (50 mg total) by mouth every 8 (eight) hours as needed for severe pain. 15 tablet 0   VRAYLAR capsule Take 3 mg by mouth at bedtime.     No current facility-administered medications on file prior to visit.   Past Medical History:  Diagnosis Date   Anxiety    Bipolar disorder (HCC)    Depression    History of kidney stones    Seizures (HCC) ~ 2009   "from taking Wellbutrin"   Symptomatic bradycardia    Allergies  Allergen Reactions   Ambien [Zolpidem Tartrate]     Seizure    Ciprofloxacin Anaphylaxis   Wellbutrin [Bupropion]     Seizure   Social History   Socioeconomic History   Marital status: Married    Spouse name: Not on file   Number of children: Not on file   Years of education: Not on file   Highest education level: Not on file  Occupational History   Not on file  Tobacco Use   Smoking status: Every Day    Types: Cigarettes    Last attempt to quit: 10/15/2017    Years since quitting: 4.0   Smokeless tobacco: Never  Vaping Use   Vaping Use: Never used  Substance and Sexual Activity   Alcohol use: Yes    Comment: occ   Drug use: No   Sexual activity: Not on file  Other Topics Concern   Not on file  Social History Narrative   Not on file   Social Determinants of Health   Financial Resource Strain: Not on file  Food Insecurity: Not on file  Transportation Needs: Not on file  Physical Activity: Not on file  Stress: Not on file  Social Connections: Not on file   Vitals:   10/25/21 0836  BP: 120/70  Pulse: 88  Resp: 16  SpO2: 99%   Wt Readings from Last 3 Encounters:  10/25/21 232 lb 4 oz (105.3 kg)  06/26/21 241 lb 9.6 oz (109.6 kg)  09/17/20 241 lb (109.3 kg)   Body mass index is 37.49 kg/m.  Physical Exam Vitals and nursing note reviewed.  Constitutional:       General: She is not in acute distress.    Appearance: She is well-developed.  HENT:     Head: Normocephalic and atraumatic.     Mouth/Throat:     Mouth: Mucous membranes are moist.     Pharynx: Oropharynx is clear.  Eyes:     Conjunctiva/sclera: Conjunctivae normal.  Cardiovascular:     Rate and Rhythm: Normal rate and regular rhythm.     Pulses:          Dorsalis pedis pulses are 2+ on the right side and 2+ on the left side.  Heart sounds: No murmur heard. Pulmonary:     Effort: Pulmonary effort is normal. No respiratory distress.     Breath sounds: Normal breath sounds.  Abdominal:     Palpations: Abdomen is soft. There is no hepatomegaly or mass.     Tenderness: There is no abdominal tenderness.  Lymphadenopathy:     Cervical: No cervical adenopathy.  Skin:    General: Skin is warm.     Findings: No erythema or rash.  Neurological:     General: No focal deficit present.     Mental Status: She is alert and oriented to person, place, and time.     Cranial Nerves: No cranial nerve deficit.     Gait: Gait normal.  Psychiatric:     Comments: Well groomed, good eye contact.   ASSESSMENT AND PLAN:  [name] was seen today for shortness of breath.  Diagnoses and all orders for this visit: Orders Placed This Encounter  Procedures   DG Chest 2 View   Comprehensive metabolic panel   CBC   EKG 12-Lead   Lab Results  Component Value Date   WBC 11.1 (H) 10/25/2021   HGB 12.3 10/25/2021   HCT 38.4 10/25/2021   MCV 86.3 10/25/2021   PLT 309.0 10/25/2021   Lab Results  Component Value Date   CREATININE 0.75 10/25/2021   BUN 10 10/25/2021   NA 137 10/25/2021   K 4.0 10/25/2021   CL 103 10/25/2021   CO2 26 10/25/2021   Lab Results  Component Value Date   ALT 11 10/25/2021   AST 10 10/25/2021   ALKPHOS 146 (H) 10/25/2021   BILITOT 0.3 10/25/2021   DOE (dyspnea on exertion) We discussed possible etiologies. Hx and examination do not suggest a serious  process. Problem has improved some as she continues exercising, so obesity and deconditioning are most likely contributing factors. CV risk factors: BMI,tobacco use(quit 3 weeks ago), and HLD. EKG done today: SR, normal axis and intervals.Compared with EKG in 07/2016 ST seg changes and sinus arrhythmia not longer present. According to pt, in 07/2016 she was evaluated in the ER due to palpitations after taking Ciprofloxacin.  CXR ordered We discussed options: Continue monitoring for changes, coronary CT scan, or cardiology evaluation.  She would like to continue monitoring for changes, she was clearly instructed about warning signs. Further recommendations according to chest x-ray and lab results.  Sleep apnea, unspecified type Apparently pulmonologist's office tried to contact her x 2. Contact information given, so she can call to arrange appt. Continue working on wt loss.  Class 2 severe obesity with serious comorbidity and body mass index (BMI) of 38.0 to 38.9 in adult, unspecified obesity type [facility]) She has lost about 10-11 Lb since 06/2021. We discussed benefits of wt loss as well as adverse effects of obesity. Consistency with healthy diet and physical activity encouraged.  Alkaline phosphatase elevation Mild. Will re-check today to be sure it is stable.  Depression, major, recurrent, in partial remission [facility]) Following with psychiatrist.   Return in about 3 months (around 01/25/2022).  Ramelo Oetken G. [place], MD  [facility]. [place] office.

## 2021-10-25 ENCOUNTER — Encounter: Payer: Self-pay | Admitting: Family Medicine

## 2021-10-25 ENCOUNTER — Ambulatory Visit (INDEPENDENT_AMBULATORY_CARE_PROVIDER_SITE_OTHER): Payer: BC Managed Care – PPO

## 2021-10-25 ENCOUNTER — Ambulatory Visit (INDEPENDENT_AMBULATORY_CARE_PROVIDER_SITE_OTHER): Payer: BC Managed Care – PPO | Admitting: Family Medicine

## 2021-10-25 ENCOUNTER — Other Ambulatory Visit: Payer: Self-pay

## 2021-10-25 VITALS — BP 120/70 | HR 88 | Resp 16 | Ht 66.0 in | Wt 232.2 lb

## 2021-10-25 DIAGNOSIS — F3341 Major depressive disorder, recurrent, in partial remission: Secondary | ICD-10-CM

## 2021-10-25 DIAGNOSIS — G4730 Sleep apnea, unspecified: Secondary | ICD-10-CM | POA: Diagnosis not present

## 2021-10-25 DIAGNOSIS — R0609 Other forms of dyspnea: Secondary | ICD-10-CM

## 2021-10-25 DIAGNOSIS — Z6838 Body mass index (BMI) 38.0-38.9, adult: Secondary | ICD-10-CM

## 2021-10-25 DIAGNOSIS — R748 Abnormal levels of other serum enzymes: Secondary | ICD-10-CM | POA: Diagnosis not present

## 2021-10-25 DIAGNOSIS — R062 Wheezing: Secondary | ICD-10-CM | POA: Diagnosis not present

## 2021-10-25 DIAGNOSIS — R0600 Dyspnea, unspecified: Secondary | ICD-10-CM | POA: Diagnosis not present

## 2021-10-25 LAB — COMPREHENSIVE METABOLIC PANEL
ALT: 11 U/L (ref 0–35)
AST: 10 U/L (ref 0–37)
Albumin: 4.1 g/dL (ref 3.5–5.2)
Alkaline Phosphatase: 146 U/L — ABNORMAL HIGH (ref 39–117)
BUN: 10 mg/dL (ref 6–23)
CO2: 26 mEq/L (ref 19–32)
Calcium: 9.5 mg/dL (ref 8.4–10.5)
Chloride: 103 mEq/L (ref 96–112)
Creatinine, Ser: 0.75 mg/dL (ref 0.40–1.20)
GFR: 93.21 mL/min (ref >60.00)
Glucose, Bld: 107 mg/dL — ABNORMAL HIGH (ref 70–99)
Potassium: 4 mEq/L (ref 3.5–5.1)
Sodium: 137 mEq/L (ref 135–145)
Total Bilirubin: 0.3 mg/dL (ref 0.2–1.2)
Total Protein: 6.9 g/dL (ref 6.0–8.3)

## 2021-10-25 LAB — CBC
HCT: 38.4 % (ref 36.0–46.0)
Hemoglobin: 12.3 g/dL (ref 12.0–15.0)
MCHC: 32.1 g/dL (ref 30.0–36.0)
MCV: 86.3 fl (ref 78.0–100.0)
Platelets: 309 10*3/uL (ref 150.0–400.0)
RBC: 4.45 Mil/uL (ref 3.87–5.11)
RDW: 14.9 % (ref 11.5–15.5)
WBC: 11.1 10*3/uL — ABNORMAL HIGH (ref 4.0–10.5)

## 2021-10-25 MED ORDER — ALBUTEROL SULFATE HFA 108 (90 BASE) MCG/ACT IN AERS: 2.0000 | INHALATION_SPRAY | Freq: Four times a day (QID) | RESPIRATORY_TRACT | 0 refills | Status: DC | PRN

## 2021-10-25 NOTE — Patient Instructions (Addendum)
A few things to remember from today's visit: ? ? ?DOE (dyspnea on exertion) - Plan: EKG 12-Lead, DG Chest 2 View, Comprehensive metabolic panel, CBC ? ?Sleep apnea, unspecified type ? ?Alkaline phosphatase elevation - Plan: Comprehensive metabolic panel ? ?If you need refills please call your pharmacy. ?Do not use My Chart to request refills or for acute issues that need immediate attention. ?  ?So continue monitoring for new symptoms. ? ?Please be sure medication list is accurate. ?If a new problem present, please set up appointment sooner than planned today. ? ?Try to call to schedule sleep test. ?Pulmonologist in [place], [place] ?Address: [address] [place], [place] [address] ?Phone: [phone]  ? ? ? ? ?

## 2021-10-28 ENCOUNTER — Encounter: Payer: Self-pay | Admitting: Family Medicine

## 2021-10-28 ENCOUNTER — Telehealth (INDEPENDENT_AMBULATORY_CARE_PROVIDER_SITE_OTHER): Payer: BC Managed Care – PPO | Admitting: Family Medicine

## 2021-10-28 VITALS — Ht 66.0 in

## 2021-10-28 DIAGNOSIS — J069 Acute upper respiratory infection, unspecified: Secondary | ICD-10-CM

## 2021-10-28 DIAGNOSIS — R0609 Other forms of dyspnea: Secondary | ICD-10-CM | POA: Diagnosis not present

## 2021-10-28 NOTE — Progress Notes (Signed)
Virtual Visit via Video Note ?I connected with [name] on 10/28/21 by a video enabled telemedicine application and verified that I am speaking with the correct person using two identifiers. ? Location patient: home ?Location provider:work office ?Persons participating in the virtual visit: patient, wife,provider ? ?I discussed the limitations of evaluation and management by telemedicine and the availability of in person appointments. The patient expressed understanding and agreed to proceed. ? ?Chief Complaint  ?Patient presents with  ? Follow-up  ? ?HPI: ?[name] is a 51 year old female with history of anxiety, bipolar disorder, depression concerned about persistent dyspnea. ?She was seen on 10/25/21, when we addressed this problem.  ?DOE has been going on for about 2-3 weeks, exacerbated by walking and with heavy lifting. She had SOB yesterday while driving. ? ?Hx of tobacco use, she has not smoked for about 3 weeks. ?Albuterol inhaler recommended, she has not noted any improvement when using medication. ?Problem is stable. ?Negative for associated CP, palpitations, diaphoresis, or syncope. ?Chest x-ray done on 10/25/21 showed central airway thickening suggesting bronchitis.  No acute findings or definitive signs of interstitial lung disease. ?EKG NSR. ?Lab Results  ?Component Value Date  ? CREATININE 0.75 10/25/2021  ? BUN 10 10/25/2021  ? NA 137 10/25/2021  ? K 4.0 10/25/2021  ? CL 103 10/25/2021  ? CO2 26 10/25/2021  ? ?Lab Results  ?Component Value Date  ? WBC 11.1 (H) 10/25/2021  ? HGB 12.3 10/25/2021  ? HCT 38.4 10/25/2021  ? MCV 86.3 10/25/2021  ? PLT 309.0 10/25/2021  ? ?Friday she started with nasal congestion, rhinorrhea, postnasal drainage, cough,and chest congestion.  She has not coughed up sputum. ?+ Fatigue. ?She has not had fever, chills, wheezing, abdominal pain, nausea, vomiting, body aches, or a skin rash. ?No sick contact. ?She is leaving town in a week, work-related. ? ?ROS: See  pertinent positives and negatives per HPI. ? ?Past Medical History:  ?Diagnosis Date  ? Anxiety   ? Bipolar disorder ([place])   ? Depression   ? History of kidney stones   ? Seizures ([place]) ~ 2009  ? "from taking Wellbutrin"  ? Symptomatic bradycardia   ? ?Past Surgical History:  ?Procedure Laterality Date  ? COLONOSCOPY  05/2001  ? /notes 06/04/2001  ? ESOPHAGOGASTRODUODENOSCOPY  11/2002  ? Archie Endo 12/05/2002  ? EYE SURGERY    ? LAPAROSCOPIC CHOLECYSTECTOMY  12/2002  ? LITHOTRIPSY  X 1  ? STRABISMUS SURGERY  1975; 1997  ? "both eyes; right eye"  ? TONSILLECTOMY  1980  ? ?History reviewed. No pertinent family history. ? ?Social History  ? ?Socioeconomic History  ? Marital status: Married  ?  Spouse name: Not on file  ? Number of children: Not on file  ? Years of education: Not on file  ? Highest education level: Not on file  ?Occupational History  ? Not on file  ?Tobacco Use  ? Smoking status: Every Day  ?  Types: Cigarettes  ?  Last attempt to quit: 10/15/2017  ?  Years since quitting: 4.0  ? Smokeless tobacco: Never  ?Vaping Use  ? Vaping Use: Never used  ?Substance and Sexual Activity  ? Alcohol use: Yes  ?  Comment: occ  ? Drug use: No  ? Sexual activity: Not on file  ?Other Topics Concern  ? Not on file  ?Social History Narrative  ? Not on file  ? ?Social Determinants of Health  ? ?Financial Resource Strain: Not on file  ?Food Insecurity: Not on  file  ?Transportation Needs: Not on file  ?Physical Activity: Not on file  ?Stress: Not on file  ?Social Connections: Not on file  ?Intimate Partner Violence: Not on file  ? ? ?Current Outpatient Medications:  ?  albuterol (VENTOLIN HFA) 108 (90 Base) MCG/ACT inhaler, Inhale 2 puffs into the lungs every 6 (six) hours as needed for wheezing or shortness of breath., Disp: 8 g, Rfl: 0 ?  baclofen (LIORESAL) 10 MG tablet, Take 0.5-1 tablets (5-10 mg total) by mouth 3 (three) times daily as needed for muscle spasms., Disp: 30 each, Rfl: 3 ?  Cariprazine HCl 6 MG CAPS, Take 6 mg by  mouth daily., Disp: , Rfl:  ?  celecoxib (CELEBREX) 100 MG capsule, Take 1 capsule (100 mg total) by mouth 2 (two) times daily., Disp: 40 capsule, Rfl: 1 ?  dexmethylphenidate (FOCALIN) 10 MG tablet, Take 10-20 mg by mouth daily., Disp: , Rfl:  ?  Dexmethylphenidate HCl 35 MG CP24, Take 1 capsule by mouth every morning., Disp: , Rfl:  ?  DULoxetine (CYMBALTA) 60 MG capsule, Take 60 mg by mouth 2 (two) times daily., Disp: , Rfl:  ?  levothyroxine (SYNTHROID) 25 MCG tablet, Take 25 mcg by mouth daily., Disp: , Rfl:  ?  LORazepam (ATIVAN) 1 MG tablet, Take 1 mg by mouth every 6 (six) hours as needed for anxiety., Disp: , Rfl:  ?  methocarbamol (ROBAXIN) 500 MG tablet, Take 1 tablet (500 mg total) by mouth 2 (two) times daily as needed for muscle spasms., Disp: 30 tablet, Rfl: 1 ?  Multiple Vitamin (MULTI-VITAMIN DAILY PO), Take by mouth., Disp: , Rfl:  ?  nabumetone (RELAFEN) 500 MG tablet, Take 1 tablet (500 mg total) by mouth 2 (two) times daily as needed., Disp: 60 tablet, Rfl: 3 ?  omeprazole (PRILOSEC) 40 MG capsule, TAKE 1 CAPSULE (40 MG TOTAL) BY MOUTH DAILY., Disp: 90 capsule, Rfl: 3 ?  OXcarbazepine (TRILEPTAL) 150 MG tablet, Take 300 mg by mouth at bedtime., Disp: , Rfl:  ?  traMADol (ULTRAM) 50 MG tablet, Take 1 tablet (50 mg total) by mouth every 8 (eight) hours as needed for severe pain., Disp: 15 tablet, Rfl: 0 ?  VRAYLAR capsule, Take 3 mg by mouth at bedtime., Disp: , Rfl:  ? ?EXAM: ? ?VITALS per patient if applicable:Ht 5\' 6"  (1.676 m)   BMI 37.49 kg/m?  ? ?GENERAL: alert, oriented, appears well and in no acute distress ? ?HEENT: atraumatic, conjunctiva clear, no obvious abnormalities on inspection of external nose and ears ?Nasal voice. ? ?NECK: normal movements of the head and neck ? ?LUNGS: on inspection no signs of respiratory distress, breathing rate appears normal, no obvious gross SOB, gasping or wheezing ? ?CV: no obvious cyanosis ? ?MS: moves all visible extremities without noticeable  abnormality ? ?PSYCH/NEURO: pleasant and cooperative, no obvious depression or anxiety, speech and thought processing grossly intact ? ?ASSESSMENT AND PLAN: ? ?Discussed the following assessment and plan: ?Orders Placed This Encounter  ?Procedures  ? Ambulatory referral to Cardiology  ? ?DOE (dyspnea on exertion) - Plan: Ambulatory referral to Cardiology ?We reviewed possible etiologies. ?COPD,deconditioning,and less likely cardiac are in the differential Dx. ?CXR negative for acute process or infiltrates/opacities. ?Continue avoiding tobacco use. ? ?She is very concerned about a serious process. ?Recommend avoiding trigger factors for now. ?Wt loss may help. ?Cardio referral placed. ?Clearly instructed about warning signs. ? ?URI, acute ?Mild. ?Differential Dx seasonal allergies. ?I do not think antibiotic is needed at this time. ?  Instructed to monitor for new symptoms. ?COVID-19 home test recommended if symptoms get worse or if she develops fever. ?Recommend plain Mucinex, nasal Flonase for nasal congestion, and nasal saline irrigations as needed. ? ?I think it is ok to travel as far as she has no fever or worsening symptoms. ? ?We could repeat CXR in a few weeks if chest congestion is persistent, before if it gets worse. ? ?We discussed possible serious and likely etiologies, options for evaluation and workup, limitations of telemedicine visit vs in person visit, treatment, treatment risks and precautions. ?The patient was advised to call back or seek an in-person evaluation if the symptoms worsen or if the condition fails to improve as anticipated. ?I discussed the assessment and treatment plan with the patient. The patient was provided an opportunity to ask questions and all were answered. The patient agreed with the plan and demonstrated an understanding of the instructions. ? ?Return if symptoms worsen or fail to improve. ?Makailee Nudelman G. [place], MD ? ?[place]. ?[place] office. ? ?

## 2021-11-13 ENCOUNTER — Encounter: Payer: Self-pay | Admitting: Family Medicine

## 2021-11-14 ENCOUNTER — Encounter: Payer: Self-pay | Admitting: Family Medicine

## 2021-11-15 ENCOUNTER — Ambulatory Visit (INDEPENDENT_AMBULATORY_CARE_PROVIDER_SITE_OTHER): Payer: BC Managed Care – PPO

## 2021-11-15 DIAGNOSIS — E538 Deficiency of other specified B group vitamins: Secondary | ICD-10-CM

## 2021-11-15 MED ORDER — CYANOCOBALAMIN 1000 MCG/ML IJ SOLN
1000.0000 ug | Freq: Once | INTRAMUSCULAR | Status: AC
  Administered 2021-11-15: 1000 ug via INTRAMUSCULAR

## 2021-11-15 NOTE — Progress Notes (Signed)
Pt here for monthly B12 injection per Dr. Jordan.  B12 1000mcg given IM, and pt tolerated injection well.  

## 2021-11-18 ENCOUNTER — Other Ambulatory Visit: Payer: Self-pay | Admitting: Family Medicine

## 2021-11-25 DIAGNOSIS — Z9889 Other specified postprocedural states: Secondary | ICD-10-CM | POA: Diagnosis not present

## 2021-11-25 DIAGNOSIS — G8929 Other chronic pain: Secondary | ICD-10-CM | POA: Diagnosis not present

## 2021-11-25 DIAGNOSIS — F909 Attention-deficit hyperactivity disorder, unspecified type: Secondary | ICD-10-CM | POA: Diagnosis not present

## 2021-11-25 DIAGNOSIS — Z87891 Personal history of nicotine dependence: Secondary | ICD-10-CM | POA: Diagnosis not present

## 2021-11-25 DIAGNOSIS — M545 Low back pain, unspecified: Secondary | ICD-10-CM | POA: Diagnosis not present

## 2021-11-25 DIAGNOSIS — F319 Bipolar disorder, unspecified: Secondary | ICD-10-CM | POA: Diagnosis not present

## 2021-11-25 DIAGNOSIS — H532 Diplopia: Secondary | ICD-10-CM | POA: Diagnosis not present

## 2021-11-25 DIAGNOSIS — Z79899 Other long term (current) drug therapy: Secondary | ICD-10-CM | POA: Diagnosis not present

## 2021-11-25 DIAGNOSIS — K589 Irritable bowel syndrome without diarrhea: Secondary | ICD-10-CM | POA: Diagnosis not present

## 2021-11-25 DIAGNOSIS — H5509 Other forms of nystagmus: Secondary | ICD-10-CM | POA: Diagnosis not present

## 2021-11-25 DIAGNOSIS — K219 Gastro-esophageal reflux disease without esophagitis: Secondary | ICD-10-CM | POA: Diagnosis not present

## 2021-11-25 DIAGNOSIS — Z881 Allergy status to other antibiotic agents status: Secondary | ICD-10-CM | POA: Diagnosis not present

## 2021-11-25 DIAGNOSIS — E039 Hypothyroidism, unspecified: Secondary | ICD-10-CM | POA: Diagnosis not present

## 2021-11-25 DIAGNOSIS — H5015 Alternating exotropia: Secondary | ICD-10-CM | POA: Diagnosis not present

## 2021-11-30 DIAGNOSIS — F4322 Adjustment disorder with anxiety: Secondary | ICD-10-CM | POA: Diagnosis not present

## 2021-12-11 ENCOUNTER — Other Ambulatory Visit: Payer: Self-pay | Admitting: Family Medicine

## 2021-12-13 ENCOUNTER — Ambulatory Visit: Payer: BC Managed Care – PPO | Admitting: Cardiology

## 2021-12-13 NOTE — Progress Notes (Deleted)
Clinical Summary Ms. Ayotte is a 51 y.o.female seen as new consult, referrd by Dr Martinique for the following medical problems.    1.DOE - tobacco history - Chest x-ray done on 10/25/21 showed central airway thickening suggesting bronchitis.  No acute findings or definitive signs of interstitial lung disease Past Medical History:  Diagnosis Date   Anxiety    Bipolar disorder ([place])    Depression    History of kidney stones    Seizures ([place]) ~ 2009   "from taking Wellbutrin"   Symptomatic bradycardia      Allergies  Allergen Reactions   Ambien [Zolpidem Tartrate]     Seizure    Ciprofloxacin Anaphylaxis   Wellbutrin [Bupropion]     Seizure     Current Outpatient Medications  Medication Sig Dispense Refill   baclofen (LIORESAL) 10 MG tablet Take 0.5-1 tablets (5-10 mg total) by mouth 3 (three) times daily as needed for muscle spasms. 30 each 3   Cariprazine HCl 6 MG CAPS Take 6 mg by mouth daily.     celecoxib (CELEBREX) 100 MG capsule Take 1 capsule (100 mg total) by mouth 2 (two) times daily. 40 capsule 1   dexmethylphenidate (FOCALIN) 10 MG tablet Take 10-20 mg by mouth daily.     Dexmethylphenidate HCl 35 MG CP24 Take 1 capsule by mouth every morning.     DULoxetine (CYMBALTA) 60 MG capsule Take 60 mg by mouth 2 (two) times daily.     levothyroxine (SYNTHROID) 25 MCG tablet Take 25 mcg by mouth daily.     LORazepam (ATIVAN) 1 MG tablet Take 1 mg by mouth every 6 (six) hours as needed for anxiety.     methocarbamol (ROBAXIN) 500 MG tablet Take 1 tablet (500 mg total) by mouth 2 (two) times daily as needed for muscle spasms. 30 tablet 1   Multiple Vitamin (MULTI-VITAMIN DAILY PO) Take by mouth.     nabumetone (RELAFEN) 500 MG tablet Take 1 tablet (500 mg total) by mouth 2 (two) times daily as needed. 60 tablet 3   omeprazole (PRILOSEC) 40 MG capsule TAKE 1 CAPSULE (40 MG TOTAL) BY MOUTH DAILY. 90 capsule 3   OXcarbazepine (TRILEPTAL) 150 MG tablet Take 300 mg by mouth  at bedtime.     traMADol (ULTRAM) 50 MG tablet Take 1 tablet (50 mg total) by mouth every 8 (eight) hours as needed for severe pain. 15 tablet 0   VENTOLIN HFA 108 (90 Base) MCG/ACT inhaler TAKE 2 PUFFS BY MOUTH EVERY 6 HOURS AS NEEDED FOR WHEEZE OR SHORTNESS OF BREATH 18 each 0   VRAYLAR capsule Take 3 mg by mouth at bedtime.     No current facility-administered medications for this visit.     Past Surgical History:  Procedure Laterality Date   COLONOSCOPY  05/2001   /notes 06/04/2001   ESOPHAGOGASTRODUODENOSCOPY  11/2002   Archie Endo 12/05/2002   EYE SURGERY     LAPAROSCOPIC CHOLECYSTECTOMY  12/2002   LITHOTRIPSY  X 1   STRABISMUS SURGERY  1975; 1997   "both eyes; right eye"   TONSILLECTOMY  1980     Allergies  Allergen Reactions   Ambien [Zolpidem Tartrate]     Seizure    Ciprofloxacin Anaphylaxis   Wellbutrin [Bupropion]     Seizure      No family history on file.   Social History Ms. Odem reports that she has been smoking cigarettes. She has never used smokeless tobacco. Ms. Mccroskey reports current alcohol use.  Review of Systems CONSTITUTIONAL: No weight loss, fever, chills, weakness or fatigue.  HEENT: Eyes: No visual loss, blurred vision, double vision or yellow sclerae.No hearing loss, sneezing, congestion, runny nose or sore throat.  SKIN: No rash or itching.  CARDIOVASCULAR:  RESPIRATORY: No shortness of breath, cough or sputum.  GASTROINTESTINAL: No anorexia, nausea, vomiting or diarrhea. No abdominal pain or blood.  GENITOURINARY: No burning on urination, no polyuria NEUROLOGICAL: No headache, dizziness, syncope, paralysis, ataxia, numbness or tingling in the extremities. No change in bowel or bladder control.  MUSCULOSKELETAL: No muscle, back pain, joint pain or stiffness.  LYMPHATICS: No enlarged nodes. No history of splenectomy.  PSYCHIATRIC: No history of depression or anxiety.  ENDOCRINOLOGIC: No reports of sweating, cold or heat intolerance. No  polyuria or polydipsia.  Marland Kitchen   Physical Examination There were no vitals filed for this visit. There were no vitals filed for this visit.  Gen: resting comfortably, no acute distress HEENT: no scleral icterus, pupils equal round and reactive, no palptable cervical adenopathy,  CV Resp: Clear to auscultation bilaterally GI: abdomen is soft, non-tender, non-distended, normal bowel sounds, no hepatosplenomegaly MSK: extremities are warm, no edema.  Skin: warm, no rash Neuro:  no focal deficits Psych: appropriate affect   Diagnostic Studies     Assessment and Plan        Arnoldo Lenis, M.D., F.A.C.C.

## 2021-12-24 ENCOUNTER — Ambulatory Visit: Payer: BC Managed Care – PPO | Admitting: Family Medicine

## 2022-01-07 DIAGNOSIS — Z9889 Other specified postprocedural states: Secondary | ICD-10-CM | POA: Diagnosis not present

## 2022-01-07 NOTE — Progress Notes (Signed)
ACUTE VISIT Chief Complaint  Patient presents with   trouble swallowing    X 6 months   HPI: [name] is a 51 y.o. female, who is here today with her wife complaining of difficulty swallowing as described above. She feels like food gets stuck in the middle of her chest, it does "hurt to swallow."  Problem is exacerbated by eating dry foods like rice and bread. Negative for exertional CP,palpitations,cough,wheezing,SOB,heartburn,abdominal pain,melena,of vomiting. + Burping. She has not tried OTC medications. Problem is getting worse.  Stopped Omeprazole a month ago. She has not smoked for 4 months, using nicotine gum 2 mg. She has appt for her colonoscopy 01/17/22.   Review of Systems  Constitutional:  Negative for activity change, appetite change, fever and unexpected weight change.  HENT:  Negative for mouth sores, postnasal drip and sore throat.   Gastrointestinal:  Negative for blood in stool.       No changes in bowel habits.  Endocrine: Negative for cold intolerance and heat intolerance.  Neurological:  Negative for syncope and weakness.  Rest see pertinent positives and negatives per HPI.  Current Outpatient Medications on File Prior to Visit  Medication Sig Dispense Refill   baclofen (LIORESAL) 10 MG tablet Take 0.5-1 tablets (5-10 mg total) by mouth 3 (three) times daily as needed for muscle spasms. 30 each 3   Cariprazine HCl 6 MG CAPS Take 6 mg by mouth daily.     dexmethylphenidate (FOCALIN) 10 MG tablet Take 10-20 mg by mouth daily.     Dexmethylphenidate HCl 35 MG CP24 Take 1 capsule by mouth every morning.     DULoxetine (CYMBALTA) 60 MG capsule Take 60 mg by mouth 2 (two) times daily.     levothyroxine (SYNTHROID) 25 MCG tablet Take 25 mcg by mouth daily.     LORazepam (ATIVAN) 1 MG tablet Take 1 mg by mouth every 6 (six) hours as needed for anxiety.     methocarbamol (ROBAXIN) 500 MG tablet Take 1 tablet (500 mg total) by mouth 2 (two) times daily as  needed for muscle spasms. 30 tablet 1   Multiple Vitamin (MULTI-VITAMIN DAILY PO) Take by mouth.     nabumetone (RELAFEN) 500 MG tablet Take 1 tablet (500 mg total) by mouth 2 (two) times daily as needed. 60 tablet 3   OXcarbazepine (TRILEPTAL) 150 MG tablet Take 300 mg by mouth at bedtime.     traMADol (ULTRAM) 50 MG tablet Take 1 tablet (50 mg total) by mouth every 8 (eight) hours as needed for severe pain. 15 tablet 0   VENTOLIN HFA 108 (90 Base) MCG/ACT inhaler TAKE 2 PUFFS BY MOUTH EVERY 6 HOURS AS NEEDED FOR WHEEZE OR SHORTNESS OF BREATH 18 each 0   VRAYLAR capsule Take 3 mg by mouth at bedtime.     No current facility-administered medications on file prior to visit.   Past Medical History:  Diagnosis Date   Anxiety    Bipolar disorder (HCC)    Depression    History of kidney stones    Seizures (HCC) ~ 2009   "from taking Wellbutrin"   Symptomatic bradycardia    Allergies  Allergen Reactions   Ambien [Zolpidem Tartrate]     Seizure    Ciprofloxacin Anaphylaxis   Wellbutrin [Bupropion]     Seizure    Social History   Socioeconomic History   Marital status: Married    Spouse name: Not on file   Number of children: Not on file  Years of education: Not on file   Highest education level: Not on file  Occupational History   Not on file  Tobacco Use   Smoking status: Former    Types: Cigarettes    Quit date: 10/15/2021    Years since quitting: 0.2   Smokeless tobacco: Never  Vaping Use   Vaping Use: Never used  Substance and Sexual Activity   Alcohol use: Yes    Comment: occ   Drug use: No   Sexual activity: Not on file  Other Topics Concern   Not on file  Social History Narrative   Not on file   Social Determinants of Health   Financial Resource Strain: Not on file  Food Insecurity: Not on file  Transportation Needs: Not on file  Physical Activity: Not on file  Stress: Not on file  Social Connections: Not on file   Vitals:   01/08/22 0713  BP:  126/70  Pulse: 80  Resp: 16  Temp: 98.1 F (36.7 C)  SpO2: 99%   Body mass index is 36.64 kg/m.  Physical Exam Vitals and nursing note reviewed.  Constitutional:      General: She is not in acute distress.    Appearance: She is well-developed.  HENT:     Head: Normocephalic and atraumatic.     Mouth/Throat:     Mouth: Mucous membranes are moist.     Pharynx: Oropharynx is clear.  Eyes:     Conjunctiva/sclera: Conjunctivae normal.  Cardiovascular:     Rate and Rhythm: Normal rate and regular rhythm.     Heart sounds: No murmur heard. Pulmonary:     Effort: Pulmonary effort is normal. No respiratory distress.     Breath sounds: Normal breath sounds.  Abdominal:     Palpations: Abdomen is soft. There is no hepatomegaly or mass.     Tenderness: There is no abdominal tenderness.  Lymphadenopathy:     Cervical: No cervical adenopathy.  Skin:    General: Skin is warm.     Findings: No erythema or rash.  Neurological:     General: No focal deficit present.     Mental Status: She is alert and oriented to person, place, and time.     Cranial Nerves: No cranial nerve deficit.  Psychiatric:     Comments: Well groomed, good eye contact.   ASSESSMENT AND PLAN:  [name] was seen today for trouble swallowing.  Diagnoses and all orders for this visit:  Dysphagia, unspecified type We discussed possible etiologies. Most likely caused by GERD. Recommend chewing slow, small sips of water when eating solids, and avoid talking at the same time she is chewing. Instructed about warning signs. If problem does not resolve, she is going to need EGD.  Gastroesophageal reflux disease, unspecified whether esophagitis present Omeprazole did not seem to help with GERD symptoms in the past. She agrees with starting Pantoprazole 40 mg daily 30 min before breakfast. Continue tobacco avoidance. GERD precautions.  -     pantoprazole (PROTONIX) 40 MG tablet; Take 1 tablet (40 mg total) by  mouth daily.  Return if symptoms worsen or fail to improve.  Murle Hellstrom G. [place], MD  [facility] [place] office.

## 2022-01-08 ENCOUNTER — Ambulatory Visit (INDEPENDENT_AMBULATORY_CARE_PROVIDER_SITE_OTHER): Payer: BC Managed Care – PPO | Admitting: Family Medicine

## 2022-01-08 ENCOUNTER — Encounter: Payer: Self-pay | Admitting: Family Medicine

## 2022-01-08 VITALS — BP 126/70 | HR 80 | Temp 98.1°F | Resp 16 | Ht 66.0 in | Wt 227.0 lb

## 2022-01-08 DIAGNOSIS — R1310 Dysphagia, unspecified: Secondary | ICD-10-CM | POA: Diagnosis not present

## 2022-01-08 DIAGNOSIS — K219 Gastro-esophageal reflux disease without esophagitis: Secondary | ICD-10-CM | POA: Diagnosis not present

## 2022-01-08 MED ORDER — PANTOPRAZOLE SODIUM 40 MG PO TBEC: 40.0000 mg | DELAYED_RELEASE_TABLET | Freq: Every day | ORAL | 3 refills | Status: DC

## 2022-01-08 NOTE — Patient Instructions (Addendum)
A few things to remember from today's visit:   Dysphagia, unspecified type  Gastroesophageal reflux disease, unspecified whether esophagitis present - Plan: pantoprazole (PROTONIX) 40 MG tablet  If you need refills please call your pharmacy. Do not use My Chart to request refills or for acute issues that need immediate attention.   Pantoprazole 40 mg to take 30 min before breakfast. Eat and chew slow, small pieces,chew well, and drink small amount of fluids with solids. Mention this to your gastroenterologist, you may need upper endoscopy at the same with your colonoscopy.  Please be sure medication list is accurate. If a new problem present, please set up appointment sooner than planned today.

## 2022-01-15 ENCOUNTER — Other Ambulatory Visit: Payer: Self-pay | Admitting: Family Medicine

## 2022-01-17 ENCOUNTER — Ambulatory Visit: Payer: BC Managed Care – PPO | Admitting: Cardiology

## 2022-01-17 NOTE — Progress Notes (Deleted)
Clinical Summary Ms. Bassett is a 51 y.o.female seen today as a new consult, referred by Dr [place]az[place] for the following medical problems.   1.DOE 10/2021 CXR: central airway thickening suggesting bronchitis 10/2015 CT chest 3 mm nodule, no significant lung disease Past Medical History:  Diagnosis Date   Anxiety    Bipolar disorder (HCC)    Depression    History of kidney stones    Seizures (HCC) ~ 2009   "from taking Wellbutrin"   Symptomatic bradycardia      Allergies  Allergen Reactions   Ambien [Zolpidem Tartrate]     Seizure    Ciprofloxacin Anaphylaxis   Wellbutrin [Bupropion]     Seizure     Current Outpatient Medications  Medication Sig Dispense Refill   baclofen (LIORESAL) 10 MG tablet Take 0.5-1 tablets (5-10 mg total) by mouth 3 (three) times daily as needed for muscle spasms. 30 each 3   Cariprazine HCl 6 MG CAPS Take 6 mg by mouth daily.     dexmethylphenidate (FOCALIN) 10 MG tablet Take 10-20 mg by mouth daily.     Dexmethylphenidate HCl 35 MG CP24 Take 1 capsule by mouth every morning.     DULoxetine (CYMBALTA) 60 MG capsule Take 60 mg by mouth 2 (two) times daily.     levothyroxine (SYNTHROID) 25 MCG tablet Take 25 mcg by mouth daily.     LORazepam (ATIVAN) 1 MG tablet Take 1 mg by mouth every 6 (six) hours as needed for anxiety.     methocarbamol (ROBAXIN) 500 MG tablet Take 1 tablet (500 mg total) by mouth 2 (two) times daily as needed for muscle spasms. 30 tablet 1   Multiple Vitamin (MULTI-VITAMIN DAILY PO) Take by mouth.     nabumetone (RELAFEN) 500 MG tablet Take 1 tablet (500 mg total) by mouth 2 (two) times daily as needed. 60 tablet 3   OXcarbazepine (TRILEPTAL) 150 MG tablet Take 300 mg by mouth at bedtime.     pantoprazole (PROTONIX) 40 MG tablet Take 1 tablet (40 mg total) by mouth daily. 30 tablet 3   traMADol (ULTRAM) 50 MG tablet Take 1 tablet (50 mg total) by mouth every 8 (eight) hours as needed for severe pain. 15 tablet 0   VENTOLIN HFA  108 (90 Base) MCG/ACT inhaler TAKE 2 PUFFS BY MOUTH EVERY 6 HOURS AS NEEDED FOR WHEEZE OR SHORTNESS OF BREATH 18 each 0   VRAYLAR capsule Take 3 mg by mouth at bedtime.     No current facility-administered medications for this visit.     Past Surgical History:  Procedure Laterality Date   COLONOSCOPY  05/2001   /notes 06/04/2001   ESOPHAGOGASTRODUODENOSCOPY  11/2002   Hattie Perch 12/05/2002   EYE SURGERY     LAPAROSCOPIC CHOLECYSTECTOMY  12/2002   LITHOTRIPSY  X 1   STRABISMUS SURGERY  1975; 1997   "both eyes; right eye"   TONSILLECTOMY  1980     Allergies  Allergen Reactions   Ambien [Zolpidem Tartrate]     Seizure    Ciprofloxacin Anaphylaxis   Wellbutrin [Bupropion]     Seizure      No family history on file.   Social History Ms. Coulson reports that she quit smoking about 3 months ago. Her smoking use included cigarettes. She has never used smokeless tobacco. Ms. Yandell reports current alcohol use.   Review of Systems CONSTITUTIONAL: No weight loss, fever, chills, weakness or fatigue.  HEENT: Eyes: No visual loss, blurred vision, double vision  or yellow sclerae.No hearing loss, sneezing, congestion, runny nose or sore throat.  SKIN: No rash or itching.  CARDIOVASCULAR:  RESPIRATORY: No shortness of breath, cough or sputum.  GASTROINTESTINAL: No anorexia, nausea, vomiting or diarrhea. No abdominal pain or blood.  GENITOURINARY: No burning on urination, no polyuria NEUROLOGICAL: No headache, dizziness, syncope, paralysis, ataxia, numbness or tingling in the extremities. No change in bowel or bladder control.  MUSCULOSKELETAL: No muscle, back pain, joint pain or stiffness.  LYMPHATICS: No enlarged nodes. No history of splenectomy.  PSYCHIATRIC: No history of depression or anxiety.  ENDOCRINOLOGIC: No reports of sweating, cold or heat intolerance. No polyuria or polydipsia.  Marland Kitchen   Physical Examination There were no vitals filed for this visit. There were no vitals  filed for this visit.  Gen: resting comfortably, no acute distress HEENT: no scleral icterus, pupils equal round and reactive, no palptable cervical adenopathy,  CV Resp: Clear to auscultation bilaterally GI: abdomen is soft, non-tender, non-distended, normal bowel sounds, no hepatosplenomegaly MSK: extremities are warm, no edema.  Skin: warm, no rash Neuro:  no focal deficits Psych: appropriate affect   Diagnostic Studies     Assessment and Plan        Antoine Poche, M.D., F.A.C.C.

## 2022-01-21 ENCOUNTER — Encounter: Payer: Self-pay | Admitting: Family Medicine

## 2022-01-22 ENCOUNTER — Telehealth: Payer: Self-pay

## 2022-01-22 ENCOUNTER — Encounter: Payer: Self-pay | Admitting: Family Medicine

## 2022-01-22 DIAGNOSIS — K635 Polyp of colon: Secondary | ICD-10-CM | POA: Diagnosis not present

## 2022-01-22 DIAGNOSIS — Z1211 Encounter for screening for malignant neoplasm of colon: Secondary | ICD-10-CM | POA: Diagnosis not present

## 2022-01-22 DIAGNOSIS — K2970 Gastritis, unspecified, without bleeding: Secondary | ICD-10-CM | POA: Diagnosis not present

## 2022-01-22 DIAGNOSIS — K449 Diaphragmatic hernia without obstruction or gangrene: Secondary | ICD-10-CM | POA: Diagnosis not present

## 2022-01-22 DIAGNOSIS — K2960 Other gastritis without bleeding: Secondary | ICD-10-CM | POA: Diagnosis not present

## 2022-01-22 DIAGNOSIS — D125 Benign neoplasm of sigmoid colon: Secondary | ICD-10-CM | POA: Diagnosis not present

## 2022-01-22 DIAGNOSIS — R1310 Dysphagia, unspecified: Secondary | ICD-10-CM | POA: Diagnosis not present

## 2022-01-22 DIAGNOSIS — K6389 Other specified diseases of intestine: Secondary | ICD-10-CM | POA: Diagnosis not present

## 2022-01-22 DIAGNOSIS — K259 Gastric ulcer, unspecified as acute or chronic, without hemorrhage or perforation: Secondary | ICD-10-CM | POA: Diagnosis not present

## 2022-01-22 LAB — HM COLONOSCOPY

## 2022-01-22 NOTE — Telephone Encounter (Signed)
LVM instructions for pt to call back to speak to [facility]. Need GI doctor name to obtain colonoscopy results.  Need records from GYN for last pap/mammo; records requested electronically 01/21/22

## 2022-01-23 ENCOUNTER — Other Ambulatory Visit: Payer: Self-pay | Admitting: Gastroenterology

## 2022-01-23 ENCOUNTER — Other Ambulatory Visit (HOSPITAL_COMMUNITY): Payer: Self-pay | Admitting: Gastroenterology

## 2022-01-23 DIAGNOSIS — R1310 Dysphagia, unspecified: Secondary | ICD-10-CM

## 2022-01-24 ENCOUNTER — Ambulatory Visit (HOSPITAL_COMMUNITY)
Admission: RE | Admit: 2022-01-24 | Discharge: 2022-01-24 | Disposition: A | Discharge status: Home / Self Care | Payer: BC Managed Care – PPO | Source: Ambulatory Visit | Attending: Gastroenterology | Admitting: Gastroenterology

## 2022-01-24 ENCOUNTER — Encounter: Payer: Self-pay | Admitting: Family Medicine

## 2022-01-24 DIAGNOSIS — R1310 Dysphagia, unspecified: Secondary | ICD-10-CM | POA: Diagnosis not present

## 2022-01-24 DIAGNOSIS — K449 Diaphragmatic hernia without obstruction or gangrene: Secondary | ICD-10-CM | POA: Diagnosis not present

## 2022-01-24 DIAGNOSIS — K224 Dyskinesia of esophagus: Secondary | ICD-10-CM | POA: Diagnosis not present

## 2022-01-30 ENCOUNTER — Encounter: Payer: Self-pay | Admitting: Family Medicine

## 2022-02-10 ENCOUNTER — Other Ambulatory Visit: Payer: Self-pay | Admitting: Family Medicine

## 2022-03-09 ENCOUNTER — Other Ambulatory Visit: Payer: Self-pay | Admitting: Family Medicine

## 2022-03-14 ENCOUNTER — Ambulatory Visit (INDEPENDENT_AMBULATORY_CARE_PROVIDER_SITE_OTHER): Payer: BC Managed Care – PPO

## 2022-03-14 DIAGNOSIS — E538 Deficiency of other specified B group vitamins: Secondary | ICD-10-CM | POA: Diagnosis not present

## 2022-03-14 MED ORDER — CYANOCOBALAMIN 1000 MCG/ML IJ SOLN
1000.0000 ug | Freq: Once | INTRAMUSCULAR | Status: AC
  Administered 2022-03-14: 1000 ug via INTRAMUSCULAR

## 2022-03-14 NOTE — Progress Notes (Signed)
Per orders of [place], Betty G, MD, injection of B12 given in  left  deltoid by Dionta Larke D Torina Ey. Patient tolerated injection well.  Lab Results  Component Value Date   VITAMINB12 390 06/26/2021

## 2022-04-03 ENCOUNTER — Other Ambulatory Visit: Payer: Self-pay | Admitting: Family Medicine

## 2022-04-03 DIAGNOSIS — K219 Gastro-esophageal reflux disease without esophagitis: Secondary | ICD-10-CM | POA: Diagnosis not present

## 2022-04-03 DIAGNOSIS — R1033 Periumbilical pain: Secondary | ICD-10-CM | POA: Diagnosis not present

## 2022-04-03 DIAGNOSIS — R6881 Early satiety: Secondary | ICD-10-CM | POA: Diagnosis not present

## 2022-04-03 DIAGNOSIS — K5900 Constipation, unspecified: Secondary | ICD-10-CM | POA: Diagnosis not present

## 2022-04-17 ENCOUNTER — Ambulatory Visit: Payer: BC Managed Care – PPO | Admitting: Cardiology

## 2022-04-17 NOTE — Progress Notes (Deleted)
Clinical Summary [name] is a 51 y.o.female seen today as a new consult, referred by Dr [place] for the following medical problems.  1.DOE -  -CXR central airway thickening suggesting bronchitis  Past Medical History:  Diagnosis Date   Anxiety    Bipolar disorder (HCC)    Depression    History of kidney stones    Seizures (HCC) ~ 2009   "from taking Wellbutrin"   Symptomatic bradycardia      Allergies  Allergen Reactions   Ambien [Zolpidem Tartrate]     Seizure    Ciprofloxacin Anaphylaxis   Wellbutrin [Bupropion]     Seizure     Current Outpatient Medications  Medication Sig Dispense Refill   baclofen (LIORESAL) 10 MG tablet Take 0.5-1 tablets (5-10 mg total) by mouth 3 (three) times daily as needed for muscle spasms. 30 each 3   Cariprazine HCl 6 MG CAPS Take 6 mg by mouth daily.     dexmethylphenidate (FOCALIN) 10 MG tablet Take 10-20 mg by mouth daily.     Dexmethylphenidate HCl 35 MG CP24 Take 1 capsule by mouth every morning.     DULoxetine (CYMBALTA) 60 MG capsule Take 60 mg by mouth 2 (two) times daily.     levothyroxine (SYNTHROID) 25 MCG tablet Take 25 mcg by mouth daily.     LORazepam (ATIVAN) 1 MG tablet Take 1 mg by mouth every 6 (six) hours as needed for anxiety.     methocarbamol (ROBAXIN) 500 MG tablet Take 1 tablet (500 mg total) by mouth 2 (two) times daily as needed for muscle spasms. 30 tablet 1   Multiple Vitamin (MULTI-VITAMIN DAILY PO) Take by mouth.     nabumetone (RELAFEN) 500 MG tablet Take 1 tablet (500 mg total) by mouth 2 (two) times daily as needed. 60 tablet 3   OXcarbazepine (TRILEPTAL) 150 MG tablet Take 300 mg by mouth at bedtime.     pantoprazole (PROTONIX) 40 MG tablet Take 1 tablet (40 mg total) by mouth daily. 30 tablet 3   traMADol (ULTRAM) 50 MG tablet Take 1 tablet (50 mg total) by mouth every 8 (eight) hours as needed for severe pain. 15 tablet 0   VRAYLAR capsule Take 3 mg by mouth at bedtime.     No current  facility-administered medications for this visit.     Past Surgical History:  Procedure Laterality Date   COLONOSCOPY  05/2001   /notes 06/04/2001   ESOPHAGOGASTRODUODENOSCOPY  11/2002   Hattie Perch 12/05/2002   EYE SURGERY     LAPAROSCOPIC CHOLECYSTECTOMY  12/2002   LITHOTRIPSY  X 1   STRABISMUS SURGERY  1975; 1997   "both eyes; right eye"   TONSILLECTOMY  1980     Allergies  Allergen Reactions   Ambien [Zolpidem Tartrate]     Seizure    Ciprofloxacin Anaphylaxis   Wellbutrin [Bupropion]     Seizure      No family history on file.   Social History [name] reports that she quit smoking about 6 months ago. Her smoking use included cigarettes. She has never used smokeless tobacco. [name] reports current alcohol use.   Review of Systems CONSTITUTIONAL: No weight loss, fever, chills, weakness or fatigue.  HEENT: Eyes: No visual loss, blurred vision, double vision or yellow sclerae.No hearing loss, sneezing, congestion, runny nose or sore throat.  SKIN: No rash or itching.  CARDIOVASCULAR:  RESPIRATORY: No shortness of breath, cough or sputum.  GASTROINTESTINAL: No anorexia, nausea, vomiting or  diarrhea. No abdominal pain or blood.  GENITOURINARY: No burning on urination, no polyuria NEUROLOGICAL: No headache, dizziness, syncope, paralysis, ataxia, numbness or tingling in the extremities. No change in bowel or bladder control.  MUSCULOSKELETAL: No muscle, back pain, joint pain or stiffness.  LYMPHATICS: No enlarged nodes. No history of splenectomy.  PSYCHIATRIC: No history of depression or anxiety.  ENDOCRINOLOGIC: No reports of sweating, cold or heat intolerance. No polyuria or polydipsia.  Marland Kitchen   Physical Examination There were no vitals filed for this visit. There were no vitals filed for this visit.  Gen: resting comfortably, no acute distress HEENT: no scleral icterus, pupils equal round and reactive, no palptable cervical adenopathy,  CV Resp: Clear to  auscultation bilaterally GI: abdomen is soft, non-tender, non-distended, normal bowel sounds, no hepatosplenomegaly MSK: extremities are warm, no edema.  Skin: warm, no rash Neuro:  no focal deficits Psych: appropriate affect   Diagnostic Studies     Assessment and Plan        Antoine Poche, M.D., F.A.C.C.

## 2022-04-23 ENCOUNTER — Telehealth: Payer: Self-pay | Admitting: Family Medicine

## 2022-04-23 MED ORDER — CYANOCOBALAMIN 1000 MCG/ML IJ SOLN: INTRAMUSCULAR | 11 refills | Status: DC

## 2022-04-23 MED ORDER — "SYRINGE 25G X 1"" 3 ML MISC": 2 refills | Status: DC

## 2022-04-23 NOTE — Telephone Encounter (Signed)
Patient wants to know if she can just get a vial of B-12 and syringes from the pharmacy and do her shots at home. Pt says she will check her mychart for the decision.

## 2022-05-06 ENCOUNTER — Telehealth: Payer: Self-pay | Admitting: Physical Medicine and Rehabilitation

## 2022-05-06 NOTE — Telephone Encounter (Signed)
Patient called needing to schedule an appointment with Dr. Ernestina Patches for her back. The number to contact patient is [phone]

## 2022-05-07 NOTE — Telephone Encounter (Signed)
OV scheduled.  °

## 2022-05-13 ENCOUNTER — Ambulatory Visit: Payer: BC Managed Care – PPO | Admitting: Physical Medicine and Rehabilitation

## 2022-05-20 ENCOUNTER — Telehealth (INDEPENDENT_AMBULATORY_CARE_PROVIDER_SITE_OTHER): Payer: BC Managed Care – PPO | Admitting: Family Medicine

## 2022-05-20 ENCOUNTER — Encounter: Payer: Self-pay | Admitting: Family Medicine

## 2022-05-20 ENCOUNTER — Ambulatory Visit: Payer: BC Managed Care – PPO | Admitting: Physical Medicine and Rehabilitation

## 2022-05-20 VITALS — Ht 66.0 in

## 2022-05-20 DIAGNOSIS — N390 Urinary tract infection, site not specified: Secondary | ICD-10-CM

## 2022-05-20 DIAGNOSIS — R300 Dysuria: Secondary | ICD-10-CM | POA: Diagnosis not present

## 2022-05-20 MED ORDER — NITROFURANTOIN MONOHYD MACRO 100 MG PO CAPS: 100.0000 mg | ORAL_CAPSULE | Freq: Two times a day (BID) | ORAL | 0 refills | Status: AC

## 2022-05-20 NOTE — Progress Notes (Signed)
Virtual Visit via Video Note I connected with [name] on 05/20/22 by a video enabled telemedicine application and verified that I am speaking with the correct person using two identifiers.  Location patient: home Location provider:work office Persons participating in the virtual visit: patient, provider  I discussed the limitations of evaluation and management by telemedicine and the availability of in person appointments. The patient expressed understanding and agreed to proceed.  Chief Complaint  Patient presents with   Dysuria   Chills   HPI: [name] is a 51 yo female with hx of chronic back pain, hypothyroidism, HTN,GERD,and depression c/o 3 days of urinary symptoms. Saturday morning while she was in [place] she had an episode of urgent incontinence when she got up in the morning, since then she has frequency, urgency, and dysuria. Dysuria  This is a new problem. The current episode started in the past 7 days. The problem occurs intermittently. The problem has been unchanged. The pain is mild. She is Sexually active (with female). Associated symptoms include hesitancy and urgency. Pertinent negatives include no flank pain, hematuria, possible pregnancy or sweats.  She does not feel like she is emptying her bladder.  She has not had a UTI in a few years. She has some nausea while traveling from [place], she has not had any today. + Chills but she has not had fever. Negative for unusual fatigue, change in appetite, abdominal pain, vomiting, changes in bowel habits, or vaginal discharge. She has had irregular menstrual period, LMP last week in 03/2022, it lasted 3 to 4 weeks.  She follows with gynecologist. She has not tried OTC medications.  ROS: See pertinent positives and negatives per HPI.  Past Medical History:  Diagnosis Date   Anxiety    Bipolar disorder (HCC)    Depression    History of kidney stones    Seizures (HCC) ~ 2009   "from taking Wellbutrin"    Symptomatic bradycardia    Past Surgical History:  Procedure Laterality Date   COLONOSCOPY  05/2001   /notes 06/04/2001   ESOPHAGOGASTRODUODENOSCOPY  11/2002   Hattie Perch 12/05/2002   EYE SURGERY     LAPAROSCOPIC CHOLECYSTECTOMY  12/2002   LITHOTRIPSY  X 1   STRABISMUS SURGERY  1975; 1997   "both eyes; right eye"   TONSILLECTOMY  1980   History reviewed. No pertinent family history.  Social History   Socioeconomic History   Marital status: Married    Spouse name: Not on file   Number of children: Not on file   Years of education: Not on file   Highest education level: Not on file  Occupational History   Not on file  Tobacco Use   Smoking status: Former    Types: Cigarettes    Quit date: 10/15/2021    Years since quitting: 0.5   Smokeless tobacco: Never  Vaping Use   Vaping Use: Never used  Substance and Sexual Activity   Alcohol use: Yes    Comment: occ   Drug use: No   Sexual activity: Not on file  Other Topics Concern   Not on file  Social History Narrative   Not on file   Social Determinants of Health   Financial Resource Strain: Not on file  Food Insecurity: Not on file  Transportation Needs: Not on file  Physical Activity: Not on file  Stress: Not on file  Social Connections: Not on file  Intimate Partner Violence: Not on file    Current Outpatient Medications:  baclofen (LIORESAL) 10 MG tablet, Take 0.5-1 tablets (5-10 mg total) by mouth 3 (three) times daily as needed for muscle spasms., Disp: 30 each, Rfl: 3   Cariprazine HCl 6 MG CAPS, Take 6 mg by mouth daily., Disp: , Rfl:    cyanocobalamin (VITAMIN B12) 1000 MCG/ML injection, Inject 1 ml (1000 mcg) every 4-5 weeks., Disp: 1 mL, Rfl: 11   dexmethylphenidate (FOCALIN) 10 MG tablet, Take 10-20 mg by mouth daily., Disp: , Rfl:    Dexmethylphenidate HCl 35 MG CP24, Take 1 capsule by mouth every morning., Disp: , Rfl:    DULoxetine (CYMBALTA) 60 MG capsule, Take 60 mg by mouth 2 (two) times daily.,  Disp: , Rfl:    levothyroxine (SYNTHROID) 25 MCG tablet, Take 25 mcg by mouth daily., Disp: , Rfl:    LORazepam (ATIVAN) 1 MG tablet, Take 1 mg by mouth every 6 (six) hours as needed for anxiety., Disp: , Rfl:    methocarbamol (ROBAXIN) 500 MG tablet, Take 1 tablet (500 mg total) by mouth 2 (two) times daily as needed for muscle spasms., Disp: 30 tablet, Rfl: 1   Multiple Vitamin (MULTI-VITAMIN DAILY PO), Take by mouth., Disp: , Rfl:    nabumetone (RELAFEN) 500 MG tablet, Take 1 tablet (500 mg total) by mouth 2 (two) times daily as needed., Disp: 60 tablet, Rfl: 3   nitrofurantoin, macrocrystal-monohydrate, (MACROBID) 100 MG capsule, Take 1 capsule (100 mg total) by mouth 2 (two) times daily for 5 days., Disp: 10 capsule, Rfl: 0   OXcarbazepine (TRILEPTAL) 150 MG tablet, Take 300 mg by mouth at bedtime., Disp: , Rfl:    pantoprazole (PROTONIX) 40 MG tablet, Take 1 tablet (40 mg total) by mouth daily., Disp: 30 tablet, Rfl: 3   Syringe/Needle, Disp, (SYRINGE 3CC/25GX1") 25G X 1" 3 ML MISC, To use with B12, Disp: 100 each, Rfl: 2   traMADol (ULTRAM) 50 MG tablet, Take 1 tablet (50 mg total) by mouth every 8 (eight) hours as needed for severe pain., Disp: 15 tablet, Rfl: 0   VRAYLAR capsule, Take 3 mg by mouth at bedtime., Disp: , Rfl:   EXAM:  VITALS per patient if applicable:Ht 5\' 6"  (1.676 m)   BMI 36.64 kg/m   GENERAL: alert, oriented, appears well and in no acute distress  HEENT: atraumatic, conjunctiva clear, no obvious abnormalities on inspection of external nose and ears  NECK: normal movements of the head and neck  LUNGS: on inspection no signs of respiratory distress, breathing rate appears normal, no obvious gross SOB, gasping or wheezing  CV: no obvious cyanosis  MS: moves all visible extremities without noticeable abnormality  PSYCH/NEURO: pleasant and cooperative, no obvious depression or anxiety, speech and thought processing grossly intact  ASSESSMENT AND  PLAN:  Discussed the following assessment and plan:  Dysuria - Plan: Culture, Urine, Urinalysis, Routine w reflex microscopic Not all the time. We discussed possible etiologies. She will collect UA tomorrow here in the clinic before starting abx treatment.  Urinary tract infection without hematuria, site unspecified - Plan: nitrofurantoin, macrocrystal-monohydrate, (MACROBID) 100 MG capsule Empiric treatment with Macrobid to start after she collects urine sample. Will follow and tailor treatment according to Ucx results and susceptibility report. Clearly instructed about warning signs. Increase fluid intake. F/U if symptoms persist.  We discussed possible serious and likely etiologies, options for evaluation and workup, limitations of telemedicine visit vs in person visit, treatment, treatment risks and precautions. The patient was advised to call back or seek an in-person evaluation if  the symptoms worsen or if the condition fails to improve as anticipated. I discussed the assessment and treatment plan with the patient. The patient was provided an opportunity to ask questions and all were answered. The patient agreed with the plan and demonstrated an understanding of the instructions.  Charrisse Masley G. [place], MD  [facility]. [place] office.

## 2022-06-03 ENCOUNTER — Ambulatory Visit: Payer: BC Managed Care – PPO | Admitting: Family Medicine

## 2022-06-03 DIAGNOSIS — F3178 Bipolar disorder, in full remission, most recent episode mixed: Secondary | ICD-10-CM | POA: Diagnosis not present

## 2022-06-03 NOTE — Progress Notes (Deleted)
ACUTE VISIT No chief complaint on file.  HPI: Ms.Caledonia Keeling is a 51 y.o. female, who is here today complaining of *** HPI  Review of Systems Rest see pertinent positives and negatives per HPI.  Current Outpatient Medications on File Prior to Visit  Medication Sig Dispense Refill  . baclofen (LIORESAL) 10 MG tablet Take 0.5-1 tablets (5-10 mg total) by mouth 3 (three) times daily as needed for muscle spasms. 30 each 3  . Cariprazine HCl 6 MG CAPS Take 6 mg by mouth daily.    . cyanocobalamin (VITAMIN B12) 1000 MCG/ML injection Inject 1 ml (1000 mcg) every 4-5 weeks. 1 mL 11  . dexmethylphenidate (FOCALIN) 10 MG tablet Take 10-20 mg by mouth daily.    Marland Kitchen Dexmethylphenidate HCl 35 MG CP24 Take 1 capsule by mouth every morning.    . DULoxetine (CYMBALTA) 60 MG capsule Take 60 mg by mouth 2 (two) times daily.    Marland Kitchen levothyroxine (SYNTHROID) 25 MCG tablet Take 25 mcg by mouth daily.    Marland Kitchen LORazepam (ATIVAN) 1 MG tablet Take 1 mg by mouth every 6 (six) hours as needed for anxiety.    . methocarbamol (ROBAXIN) 500 MG tablet Take 1 tablet (500 mg total) by mouth 2 (two) times daily as needed for muscle spasms. 30 tablet 1  . Multiple Vitamin (MULTI-VITAMIN DAILY PO) Take by mouth.    . nabumetone (RELAFEN) 500 MG tablet Take 1 tablet (500 mg total) by mouth 2 (two) times daily as needed. 60 tablet 3  . OXcarbazepine (TRILEPTAL) 150 MG tablet Take 300 mg by mouth at bedtime.    . pantoprazole (PROTONIX) 40 MG tablet Take 1 tablet (40 mg total) by mouth daily. 30 tablet 3  . Syringe/Needle, Disp, (SYRINGE 3CC/25GX1") 25G X 1" 3 ML MISC To use with B12 100 each 2  . traMADol (ULTRAM) 50 MG tablet Take 1 tablet (50 mg total) by mouth every 8 (eight) hours as needed for severe pain. 15 tablet 0  . VRAYLAR capsule Take 3 mg by mouth at bedtime.     No current facility-administered medications on file prior to visit.     Past Medical History:  Diagnosis Date  . Anxiety   . Bipolar disorder  ([place])   . Depression   . History of kidney stones   . Seizures ([place]) ~ 2009   "from taking Wellbutrin"  . Symptomatic bradycardia    Allergies  Allergen Reactions  . Ambien [Zolpidem Tartrate]     Seizure   . Ciprofloxacin Anaphylaxis  . Wellbutrin [Bupropion]     Seizure    Social History   Socioeconomic History  . Marital status: Married    Spouse name: Not on file  . Number of children: Not on file  . Years of education: Not on file  . Highest education level: Not on file  Occupational History  . Not on file  Tobacco Use  . Smoking status: Former    Types: Cigarettes    Quit date: 10/15/2021    Years since quitting: 0.6  . Smokeless tobacco: Never  Vaping Use  . Vaping Use: Never used  Substance and Sexual Activity  . Alcohol use: Yes    Comment: occ  . Drug use: No  . Sexual activity: Not on file  Other Topics Concern  . Not on file  Social History Narrative  . Not on file   Social Determinants of Health   Financial Resource Strain: Not on file  Food  Insecurity: Not on file  Transportation Needs: Not on file  Physical Activity: Not on file  Stress: Not on file  Social Connections: Not on file    There were no vitals filed for this visit. There is no height or weight on file to calculate BMI.  Physical Exam  ASSESSMENT AND PLAN:  There are no diagnoses linked to this encounter.   No follow-ups on file.   Betty G. [place], MD  [facility]. [place] office.  Discharge Instructions   None

## 2022-07-29 ENCOUNTER — Telehealth: Payer: Self-pay | Admitting: Physical Medicine and Rehabilitation

## 2022-07-29 NOTE — Telephone Encounter (Signed)
Pt's wife called asking for back appt. Pt phone number is [phone]

## 2022-07-30 NOTE — Telephone Encounter (Signed)
LVM #1 to return call to clinic 

## 2022-07-31 ENCOUNTER — Telehealth: Payer: Self-pay | Admitting: Physical Medicine and Rehabilitation

## 2022-07-31 NOTE — Telephone Encounter (Signed)
Patient states she is returning a call from [place]..please advise..[phone]

## 2022-08-05 NOTE — Telephone Encounter (Signed)
Spoke with patient and she is starting to have the same type of pain as before. She got her RFA 07/2021. She had a fall where she fell forward since the RFA. She stated her pain came back some time in October. Please advise

## 2022-08-06 ENCOUNTER — Other Ambulatory Visit: Payer: Self-pay | Admitting: Physical Medicine and Rehabilitation

## 2022-08-06 DIAGNOSIS — M47816 Spondylosis without myelopathy or radiculopathy, lumbar region: Secondary | ICD-10-CM

## 2022-08-06 DIAGNOSIS — G8929 Other chronic pain: Secondary | ICD-10-CM

## 2022-08-06 NOTE — Telephone Encounter (Signed)
Spoke with patient and informed her that RFA order was placed and awaiting insurance approval

## 2022-09-02 ENCOUNTER — Telehealth: Payer: Self-pay | Admitting: Physical Medicine and Rehabilitation

## 2022-09-02 NOTE — Telephone Encounter (Signed)
Patient states she got a denial letter from insurance for procedure.[phone]

## 2022-09-04 NOTE — Telephone Encounter (Signed)
This has been resolved, patient has been scheduled

## 2022-09-05 ENCOUNTER — Encounter: Payer: BC Managed Care – PPO | Admitting: Family Medicine

## 2022-09-08 IMAGING — MR MR LUMBAR SPINE W/O CM
4 of 6 series · 25 of 48 positions shown · non-contrast
Comparison: Lumbar spine radiographs 09/17/2020

CLINICAL DATA: Back and right hip and thigh pain for 1 year.

EXAM:
MRI LUMBAR SPINE WITHOUT CONTRAST
TECHNIQUE: Multiplanar, multisequence MR imaging of the lumbar spine was
performed. No intravenous contrast was administered.

[Series 2: T2 · sagittal · 4.0mm · 0.81mm/px · 5 of 16 slices shown (1 of 2)]
[im 1/16]
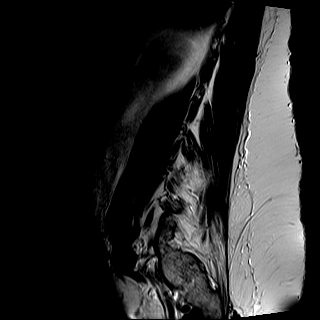
[im 4/16]
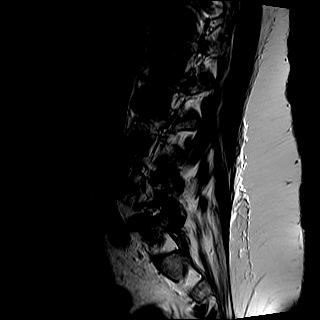
[im 8/16]
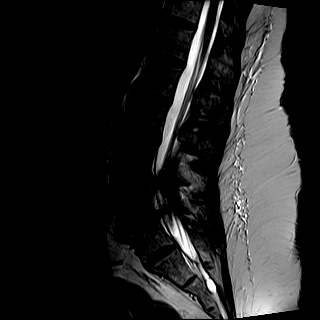
[im 12/16]
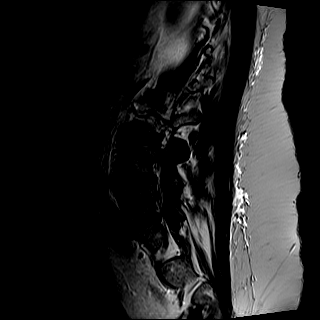
[im 16/16]
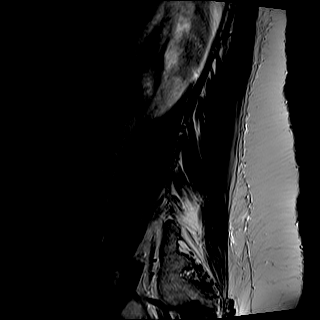

[Series 4: T1 · sagittal · 4.0mm · 0.81mm/px · 6 of 16 slices shown (1 of 2)]
[im 1/16]
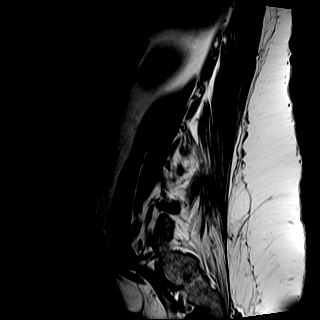
[im 4/16]
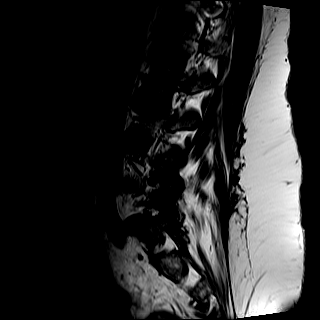
[im 7/16]
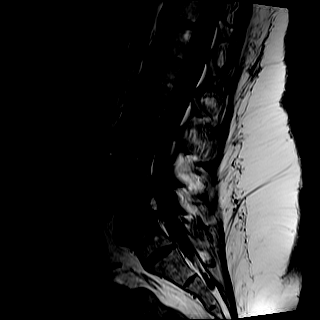
[im 10/16]
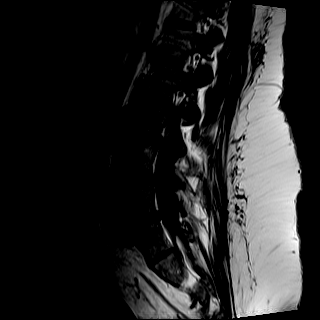
[im 13/16]
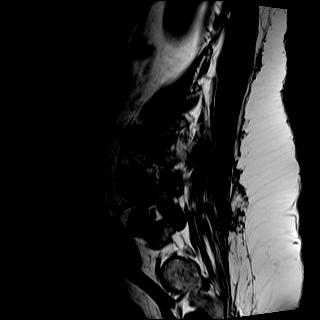
[im 16/16]
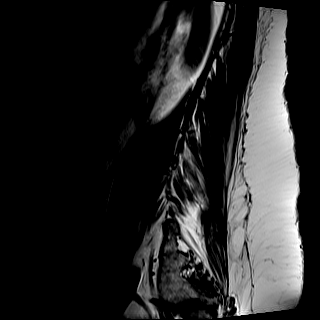

[Series 5: T2 · axial · 4.0mm · 0.39mm/px · z∈[-107,+110]mm · 9 of 36 slices shown (2 of 2)]
[im 1/36]
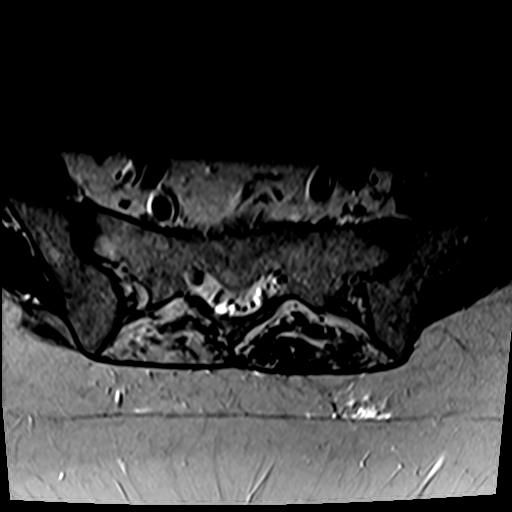
[im 6/36]
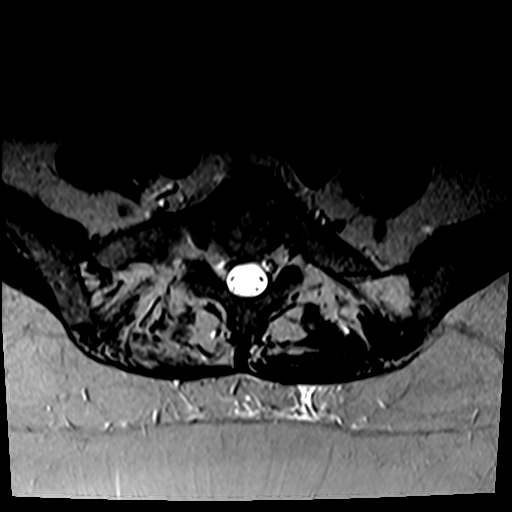
[im 12/36]
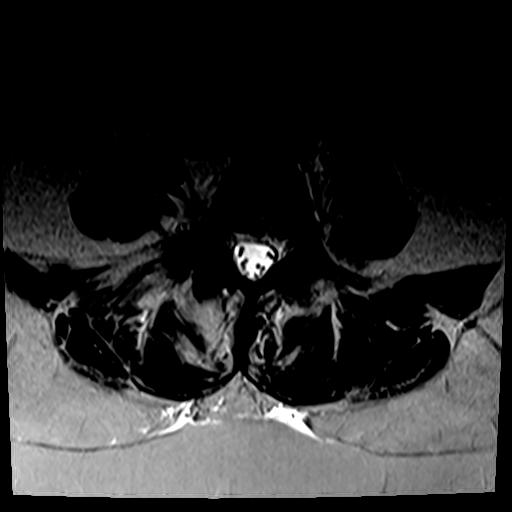
[im 15/36]
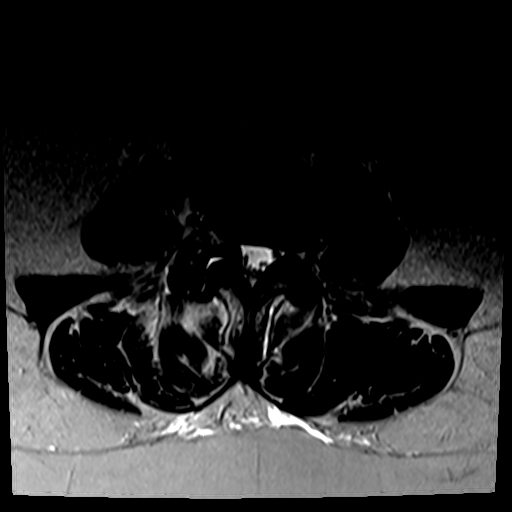
[im 18/36]
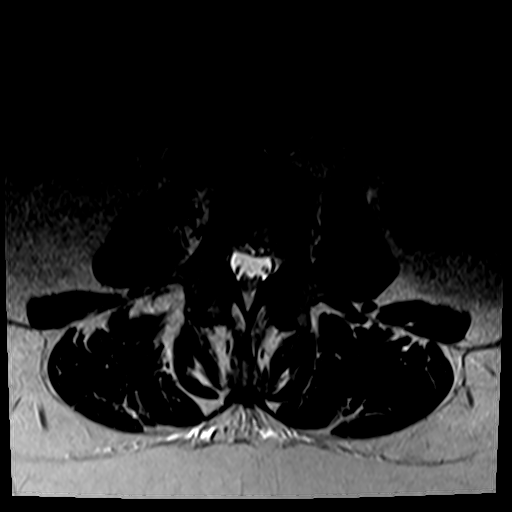
[im 21/36]
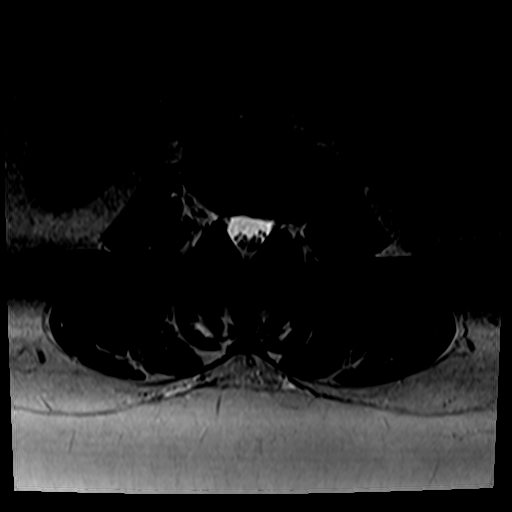
[im 24/36]
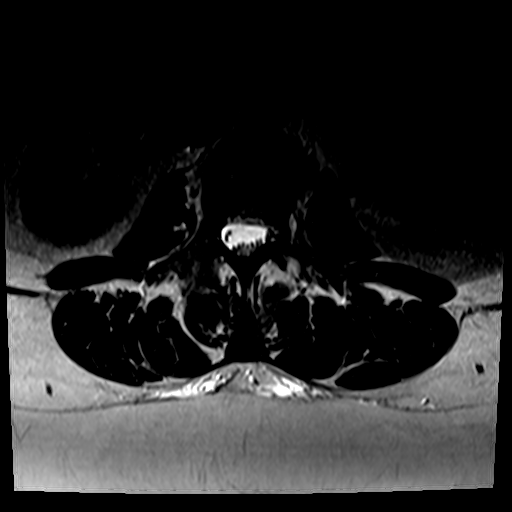
[im 30/36]
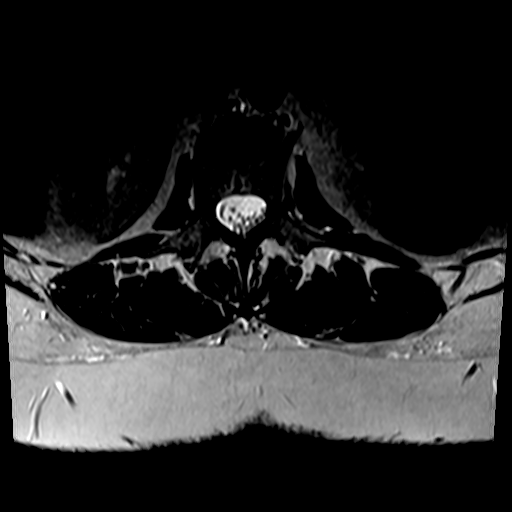
[im 36/36]
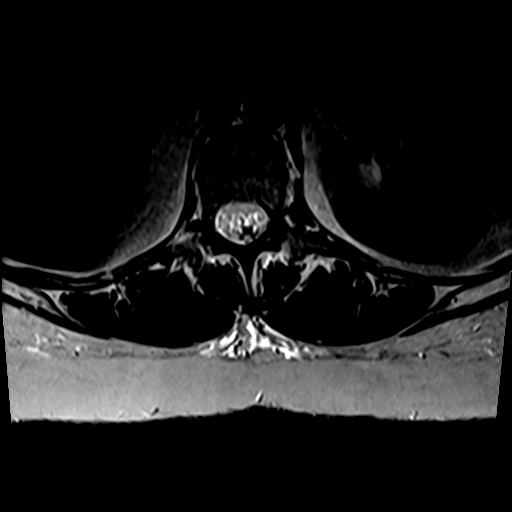

[Series 6: T1 · axial · 4.0mm · 0.39mm/px · z∈[-107,+81]mm · 5 of 36 slices shown (2 of 2)]
[im 1/36]
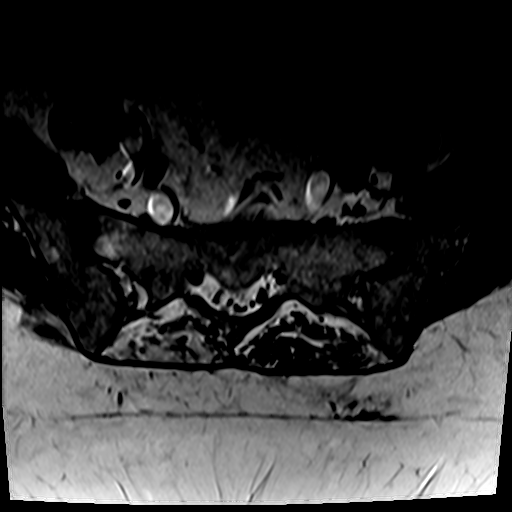
[im 6/36]
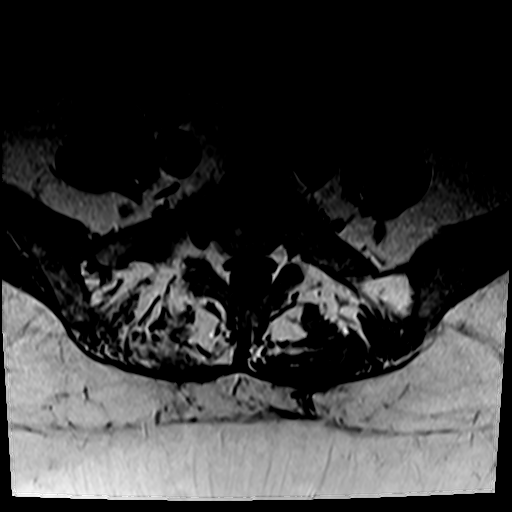
[im 12/36]
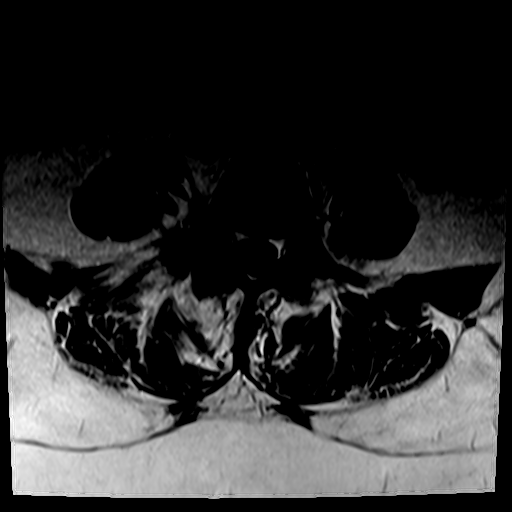
[im 18/36]
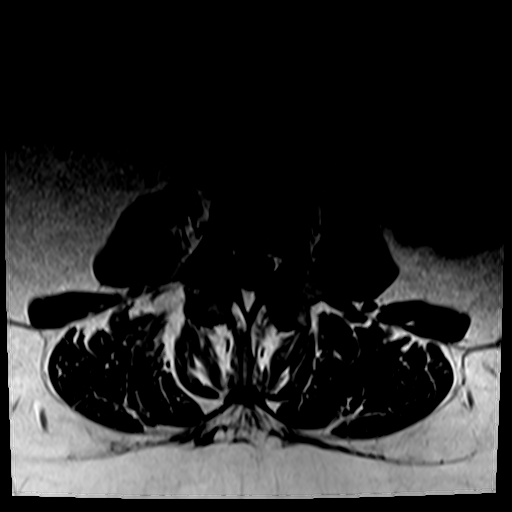
[im 30/36]
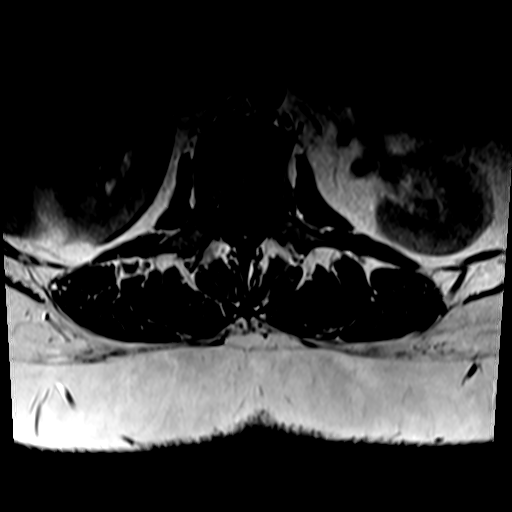

[25 of 48 positions shown; findings below may reference images not displayed]

FINDINGS: Segmentation: There are five lumbar type vertebral bodies. The last
full intervertebral disc space is labeled L5-S1. Small disc space
noted at S1-2.

Alignment:  Normal

Vertebrae:  Normal marrow signal.  No bone lesions or fractures.

Conus medullaris and cauda equina: Conus extends to the L1 level.
Conus and cauda equina appear normal.

Paraspinal and other soft tissues: No significant paraspinal or
retroperitoneal findings.

Disc levels:

L1-2: No significant findings.

L2-3: No significant findings.  Mild facet disease.

L3-4: Mild bulging annulus and mild facet disease but no focal disc
protrusions, spinal or foraminal stenosis.

L4-5: Diffuse annular bulge but no focal disc protrusion. There is
severe right-sided facet disease and moderate left-sided facet
disease but no significant spinal stenosis. Mild bilateral lateral
recess encroachment and mild foraminal encroachment.

L5-S1 disc desiccation and degeneration but no focal disc
protrusion. Moderate to advanced bilateral facet disease, right
greater than left but no significant lateral recess or foraminal
stenosis.
IMPRESSION: 1. Mild bilateral lateral recess encroachment and mild bilateral
foraminal encroachment at L4-5.
2. Moderate to advanced bilateral facet disease at L5-S1 but no
significant lateral recess or foraminal stenosis.

## 2022-09-15 LAB — HM PAP SMEAR: HM Pap smear: NORMAL

## 2022-09-16 ENCOUNTER — Ambulatory Visit: Payer: No Typology Code available for payment source | Admitting: Physical Medicine and Rehabilitation

## 2022-09-16 ENCOUNTER — Ambulatory Visit: Payer: Self-pay

## 2022-09-16 VITALS — BP 119/75 | HR 73

## 2022-09-16 DIAGNOSIS — M47816 Spondylosis without myelopathy or radiculopathy, lumbar region: Secondary | ICD-10-CM

## 2022-09-16 MED ORDER — METHYLPREDNISOLONE ACETATE 80 MG/ML IJ SUSP
80.0000 mg | Freq: Once | INTRAMUSCULAR | Status: AC
  Administered 2022-09-16: 80 mg

## 2022-09-16 NOTE — Progress Notes (Unsigned)
Functional Pain Scale - descriptive words and definitions  Distressing (6)    Pain is present/unable to complete most ADLs limited by pain/sleep is difficult and active distraction is only marginal. Moderate range order  Average Pain 6-7   +Driver, -BT, -Dye Allergies.  Right sided low back pain with no radiation

## 2022-09-16 NOTE — Patient Instructions (Signed)

## 2022-09-17 NOTE — Progress Notes (Signed)
[name] - 52 y.o. female MRN [id number]  Date of birth: Oct 25, 1970  Office Visit Note: Visit Date: 09/16/2022 PCP: Martinique, Betty G, MD Referred by: Martinique, Betty G, MD  Subjective: No chief complaint on file.  HPI:  [name] is a 52 y.o. female who comes in todayfor planned repeat radiofrequency ablation of the Right L4-5 and L5-S1  Lumbar facet joints. This would be ablation of the corresponding medial branches and/or dorsal rami.  Patient has had double diagnostic blocks with more than 70% relief.  Subsequent ablation gave them more than 6 months of over 60% relief.  They have had chronic back pain for quite some time, more than 3 months, which has been an ongoing situation with recalcitrant axial back pain.  They have no radicular pain.  Their axial pain is worse with standing and ambulating and on exam today with facet loading.  They have had physical therapy as well as home exercise program.  The imaging noted in the chart below indicated facet pathology. Accordingly they meet all the criteria and qualification for for radiofrequency ablation and we are going to complete this today hopefully for more longer term relief as part of comprehensive management program.   ROS Otherwise per HPI.  Assessment & Plan: Visit Diagnoses:    ICD-10-CM   1. Spondylosis without myelopathy or radiculopathy, lumbar region  M47.816 XR C-ARM NO REPORT    Radiofrequency,Lumbar    methylPREDNISolone acetate (DEPO-MEDROL) injection 80 mg      Plan: No additional findings.   Meds & Orders:  Meds ordered this encounter  Medications   methylPREDNISolone acetate (DEPO-MEDROL) injection 80 mg    Orders Placed This Encounter  Procedures   Radiofrequency,Lumbar   XR C-ARM NO REPORT    Follow-up: Return for visit to requesting provider as needed.   Procedures: No procedures performed  Lumbar Facet Joint Nerve Denervation  [name]      Date of Birth: 1971/08/15 MRN:  [id number] PCP: Martinique, Betty G, MD      Visit Date: 09/16/2022   Universal Protocol:    Date/Time: 01/31/248:14 AM  Consent Given By: the patient  Position: PRONE  Additional Comments: Vital signs were monitored before and after the procedure. Patient was prepped and draped in the usual sterile fashion. The correct patient, procedure, and site was verified.   Injection Procedure Details:   Procedure diagnoses:  1. Spondylosis without myelopathy or radiculopathy, lumbar region      Meds Administered:  Meds ordered this encounter  Medications   methylPREDNISolone acetate (DEPO-MEDROL) injection 80 mg     Laterality: Right  Location/Site:  L4-L5, L3 and L4 medial branches and L5-S1, L4 medial branch and L5 dorsal ramus  Needle: 18 ga.,  51mm active tip, 144mm RF Cannula  Needle Placement: Along juncture of superior articular process and transverse pocess  Findings:  -Comments:  Procedure Details: For each desired target nerve, the corresponding transverse process (sacral ala for the L5 dorsal rami) was identified and the fluoroscope was positioned to square off the endplates of the corresponding vertebral body to achieve a true AP midline view.  The beam was then obliqued 15 to 20 degrees and caudally tilted 15 to 20 degrees to line up a trajectory along the target nerves. The skin over the target of the junction of superior articulating process and transverse process (sacral ala for the L5 dorsal rami) was infiltrated with 9ml of 1% Lidocaine without Epinephrine.  The 18 gauge 19mm active tip  outer cannula was advanced in trajectory view to the target.  This procedure was repeated for each target nerve.  Then, for all levels, the outer cannula placement was fine-tuned and the position was then confirmed with bi-planar imaging.    Test stimulation was done both at sensory and motor levels to ensure there was no radicular stimulation. The target tissues were then infiltrated  with 1 ml of 1% Lidocaine without Epinephrine. Subsequently, a percutaneous neurotomy was carried out for 90 seconds at 80 degrees Celsius.  After the completion of the lesion, 1 ml of injectate was delivered. It was then repeated for each facet joint nerve mentioned above. Appropriate radiographs were obtained to verify the probe placement during the neurotomy.   Additional Comments:  The patient tolerated the procedure well Dressing: 2 x 2 sterile gauze and Band-Aid    Post-procedure details: Patient was observed during the procedure. Post-procedure instructions were reviewed.  Patient left the clinic in stable condition.      Clinical History: No specialty comments available.     Objective:  VS:  HT:    WT:   BMI:     BP:119/75  HR:73bpm  TEMP: ( )  RESP:  Physical Exam Vitals and nursing note reviewed.  Constitutional:      General: She is not in acute distress.    Appearance: Normal appearance. She is obese. She is not ill-appearing.  HENT:     Head: Normocephalic and atraumatic.     Right Ear: External ear normal.     Left Ear: External ear normal.  Eyes:     Extraocular Movements: Extraocular movements intact.  Cardiovascular:     Rate and Rhythm: Normal rate.     Pulses: Normal pulses.  Pulmonary:     Effort: Pulmonary effort is normal. No respiratory distress.  Abdominal:     General: There is no distension.     Palpations: Abdomen is soft.  Musculoskeletal:        General: Tenderness present.     Cervical back: Neck supple.     Right lower leg: No edema.     Left lower leg: No edema.     Comments: Patient has good distal strength with no pain over the greater trochanters.  No clonus or focal weakness.  Skin:    Findings: No erythema, lesion or rash.  Neurological:     General: No focal deficit present.     Mental Status: She is alert and oriented to person, place, and time.     Sensory: No sensory deficit.     Motor: No weakness or abnormal muscle  tone.     Coordination: Coordination normal.  Psychiatric:        Mood and Affect: Mood normal.        Behavior: Behavior normal.      Imaging: No results found.

## 2022-09-17 NOTE — Procedures (Signed)
Lumbar Facet Joint Nerve Denervation  [name]      Date of Birth: 1970-10-10 MRN: [id number] PCP: Martinique, Betty G, MD      Visit Date: 09/16/2022   Universal Protocol:    Date/Time: 01/31/248:14 AM  Consent Given By: the patient  Position: PRONE  Additional Comments: Vital signs were monitored before and after the procedure. Patient was prepped and draped in the usual sterile fashion. The correct patient, procedure, and site was verified.   Injection Procedure Details:   Procedure diagnoses:  1. Spondylosis without myelopathy or radiculopathy, lumbar region      Meds Administered:  Meds ordered this encounter  Medications   methylPREDNISolone acetate (DEPO-MEDROL) injection 80 mg     Laterality: Right  Location/Site:  L4-L5, L3 and L4 medial branches and L5-S1, L4 medial branch and L5 dorsal ramus  Needle: 18 ga.,  54mm active tip, 17mm RF Cannula  Needle Placement: Along juncture of superior articular process and transverse pocess  Findings:  -Comments:  Procedure Details: For each desired target nerve, the corresponding transverse process (sacral ala for the L5 dorsal rami) was identified and the fluoroscope was positioned to square off the endplates of the corresponding vertebral body to achieve a true AP midline view.  The beam was then obliqued 15 to 20 degrees and caudally tilted 15 to 20 degrees to line up a trajectory along the target nerves. The skin over the target of the junction of superior articulating process and transverse process (sacral ala for the L5 dorsal rami) was infiltrated with 37ml of 1% Lidocaine without Epinephrine.  The 18 gauge 83mm active tip outer cannula was advanced in trajectory view to the target.  This procedure was repeated for each target nerve.  Then, for all levels, the outer cannula placement was fine-tuned and the position was then confirmed with bi-planar imaging.    Test stimulation was done both at sensory and  motor levels to ensure there was no radicular stimulation. The target tissues were then infiltrated with 1 ml of 1% Lidocaine without Epinephrine. Subsequently, a percutaneous neurotomy was carried out for 90 seconds at 80 degrees Celsius.  After the completion of the lesion, 1 ml of injectate was delivered. It was then repeated for each facet joint nerve mentioned above. Appropriate radiographs were obtained to verify the probe placement during the neurotomy.   Additional Comments:  The patient tolerated the procedure well Dressing: 2 x 2 sterile gauze and Band-Aid    Post-procedure details: Patient was observed during the procedure. Post-procedure instructions were reviewed.  Patient left the clinic in stable condition.

## 2022-10-01 NOTE — Progress Notes (Signed)
HPI: MaryMary Shields is a 52 y.o. female with PMHx significant for HTN,hypothyroidism,depression,bipolar disorder, and GERD  here today with her wife for her routine physical.  Last CPE: 06/26/21. Last visit with her gyn on 09/15/22.  She admits to not engaging in regular physical activity and having a diet consisting of home cooking, eating out approximately three times per week, and preferring vegetable plates.  She consumes mainly chicken, and occasionally a small steak.  She reports having a Pop-Tart for breakfast today.  She experiences variable sleep patterns, with 2-5 hours on weekdays and more on weekends.  She denies smoking and reports minimal alcohol consumption.   Immunization History  Administered Date(s) Administered   Influenza Inj Mdck Quad Pf 09/09/2018   Influenza Nasal 06/01/2008   Influenza Split 09/09/2017   Influenza, Quadrivalent, Recombinant, Inj, Pf 05/19/2019   Influenza,inj,Quad PF,6+ Mos 08/17/2017, 04/29/2020   Influenza-Unspecified 05/19/2015, 05/18/2016, 08/17/2017, 05/22/2021, 05/18/2022   Moderna Covid-19 Vaccine Bivalent Booster 19yr & up 05/22/2021   Moderna Sars-Covid-2 Vaccination 11/01/2019, 11/29/2019, 06/29/2020   Tdap 07/19/2016   Unspecified SARS-COV-2 Vaccination 05/18/2022   Zoster Recombinat (Shingrix) 06/12/2022   Health Maintenance  Topic Date Due   Zoster Vaccines- Shingrix (2 of 2) 08/07/2022   COVID-19 Vaccine (6 - 2023-24 season) 10/19/2022 (Originally 07/13/2022)   HIV Screening  12/14/2023 (Originally 12/26/1985)   MAMMOGRAM  07/23/2023   PAP SMEAR-Modifier  02/01/2025   DTaP/Tdap/Td (2 - Td or Tdap) 07/19/2026   COLONOSCOPY (Pts 45-464yrInsurance coverage will need to be confirmed)  01/23/2027   INFLUENZA VACCINE  Completed   Hepatitis C Screening  Completed   HPV VACCINES  Aged Out   Her wife is concerned because [name] been complaining of CP in the middle of the night and is still having some shortness of  breath. She had a canceled appointment with a cardiologist due to work, referred initially because DOE, which she is reporting has greatly improved. She denies exertional CP and thinks chest pains are caused by GERD.  + heartburn. She follows with GI, Dr MaCollene Maresshe completed 2 months of PPI.  She also reports occasional irregular heartbeats, which she describes as feeling like her heart skips a beat. Hyperlipidemia: She is not on pharmacologic treatment. Lab Results  Component Value Date   CHOL 196 06/26/2021   HDL 62.30 06/26/2021   LDLCALC 117 (H) 06/26/2021   TRIG 84.0 06/26/2021   CHOLHDL 3 06/26/2021   Hypothyroidism: Currently she is on levothyroxine 25 mcg daily, which has been prescribed by her gynecologist.  According to patient, she has not had TSH done.  Lab Results  Component Value Date   TSH 1.29 06/26/2021   B12 deficiency: She is on B12 at 1000 mcg IM every month.  Lab Results  Component Value Date   VITAMINB12 390 06/26/2021   Bipolar disorder, anxiety, and depression: She follows with psychiatrist every 6 months. Reporting history of nightmares, "vivid dreams" almost every night.  Chronic lower back pain: Status post nerve ablation,which helped. She follows with Dr NeErnestina Patches Prediabetes: Negative for polyphagia, polydipsia, pyuria. Lab Results  Component Value Date   HGBA1C 6.1 06/26/2021   Review of Systems  Constitutional:  Negative for appetite change and fever.  HENT:  Negative for hearing loss, mouth sores and sore throat.   Eyes:  Negative for redness and visual disturbance.  Respiratory:  Negative for cough and wheezing.   Cardiovascular:  Negative for palpitations and leg swelling.  Gastrointestinal:  Negative for  abdominal pain, nausea and vomiting.       No changes in bowel habits.  Endocrine: Negative for cold intolerance and heat intolerance.  Genitourinary:  Negative for decreased urine volume, dysuria, hematuria, vaginal bleeding and vaginal  discharge.  Musculoskeletal:  Positive for arthralgias. Negative for gait problem and myalgias.  Skin:  Negative for color change and rash.  Neurological:  Negative for syncope, weakness and headaches.  Hematological:  Negative for adenopathy. Does not bruise/bleed easily.  Psychiatric/Behavioral:  Positive for sleep disturbance. Negative for confusion and hallucinations. The patient is nervous/anxious.   All other systems reviewed and are negative.  Current Outpatient Medications on File Prior to Visit  Medication Sig Dispense Refill   baclofen (LIORESAL) 10 MG tablet Take 0.5-1 tablets (5-10 mg total) by mouth 3 (three) times daily as needed for muscle spasms. 30 each 3   Cariprazine HCl 6 MG CAPS Take 6 mg by mouth daily.     cyanocobalamin (VITAMIN B12) 1000 MCG/ML injection Inject 1 ml (1000 mcg) every 4-5 weeks. 1 mL 11   dexmethylphenidate (FOCALIN) 10 MG tablet Take 10-20 mg by mouth daily.     Dexmethylphenidate HCl 35 MG CP24 Take 1 capsule by mouth every morning.     DULoxetine (CYMBALTA) 60 MG capsule Take 60 mg by mouth 2 (two) times daily.     levothyroxine (SYNTHROID) 25 MCG tablet Take 25 mcg by mouth daily.     LORazepam (ATIVAN) 1 MG tablet Take 1 mg by mouth every 6 (six) hours as needed for anxiety.     methocarbamol (ROBAXIN) 500 MG tablet Take 1 tablet (500 mg total) by mouth 2 (two) times daily as needed for muscle spasms. 30 tablet 1   Multiple Vitamin (MULTI-VITAMIN DAILY PO) Take by mouth.     nabumetone (RELAFEN) 500 MG tablet Take 1 tablet (500 mg total) by mouth 2 (two) times daily as needed. 60 tablet 3   OXcarbazepine (TRILEPTAL) 150 MG tablet Take 300 mg by mouth at bedtime.     pantoprazole (PROTONIX) 40 MG tablet Take 1 tablet (40 mg total) by mouth daily. 30 tablet 3   Syringe/Needle, Disp, (SYRINGE 3CC/25GX1") 25G X 1" 3 ML MISC To use with B12 100 each 2   VRAYLAR capsule Take 3 mg by mouth at bedtime.     No current facility-administered medications on  file prior to visit.   Past Medical History:  Diagnosis Date   Anxiety    Bipolar disorder ([place])    Depression    History of kidney stones    Seizures ([place]) ~ 2009   "from taking Wellbutrin"   Symptomatic bradycardia    Past Surgical History:  Procedure Laterality Date   COLONOSCOPY  05/2001   /notes 06/04/2001   ESOPHAGOGASTRODUODENOSCOPY  11/2002   Archie Endo 12/05/2002   EYE SURGERY     LAPAROSCOPIC CHOLECYSTECTOMY  12/2002   LITHOTRIPSY  X 1   STRABISMUS SURGERY  1975; 1997   "both eyes; right eye"   TONSILLECTOMY  1980   Allergies  Allergen Reactions   Ambien [Zolpidem Tartrate]     Seizure    Ciprofloxacin Anaphylaxis   Wellbutrin [Bupropion]     Seizure   History reviewed. No pertinent family history.  Social History   Socioeconomic History   Marital status: Married    Spouse name: Not on file   Number of children: Not on file   Years of education: Not on file   Highest education level: Not on file  Occupational History   Not on file  Tobacco Use   Smoking status: Former    Types: Cigarettes    Quit date: 10/15/2021    Years since quitting: 0.9   Smokeless tobacco: Never  Vaping Use   Vaping Use: Never used  Substance and Sexual Activity   Alcohol use: Yes    Comment: occ   Drug use: No   Sexual activity: Not on file  Other Topics Concern   Not on file  Social History Narrative   Not on file   Social Determinants of Health   Financial Resource Strain: Not on file  Food Insecurity: Not on file  Transportation Needs: Not on file  Physical Activity: Not on file  Stress: Not on file  Social Connections: Not on file   Vitals:   10/03/22 0742  BP: 124/80  Pulse: 80  Resp: 12  Temp: 97.9 F (36.6 C)  SpO2: 99%  Body mass index is 39.95 kg/m. Wt Readings from Last 3 Encounters:  10/03/22 247 lb 8 oz (112.3 kg)  01/08/22 227 lb (103 kg)  10/25/21 232 lb 4 oz (105.3 kg)   Physical Exam Vitals and nursing note reviewed.  Constitutional:       General: She is not in acute distress.    Appearance: She is well-developed.  HENT:     Head: Normocephalic and atraumatic.     Right Ear: Hearing, tympanic membrane, ear canal and external ear normal.     Left Ear: Hearing, tympanic membrane, ear canal and external ear normal.     Mouth/Throat:     Mouth: Mucous membranes are moist.     Pharynx: Oropharynx is clear. Uvula midline.  Eyes:     Extraocular Movements: Extraocular movements intact.     Conjunctiva/sclera: Conjunctivae normal.     Pupils: Pupils are equal, round, and reactive to light.  Neck:     Thyroid: No thyromegaly.     Trachea: No tracheal deviation.  Cardiovascular:     Rate and Rhythm: Normal rate and regular rhythm.     Heart sounds: No murmur heard.    Comments: DP and PT pulses palpable, bilateral. Pulmonary:     Effort: Pulmonary effort is normal. No respiratory distress.     Breath sounds: Normal breath sounds.  Abdominal:     Palpations: Abdomen is soft. There is no hepatomegaly or mass.     Tenderness: There is no abdominal tenderness.  Genitourinary:    Comments: Deferred to gyn. Musculoskeletal:     Right lower leg: No edema.     Left lower leg: No edema.     Comments: No major deformity or signs of synovitis appreciated.  Lymphadenopathy:     Cervical: No cervical adenopathy.     Upper Body:     Right upper body: No supraclavicular adenopathy.     Left upper body: No supraclavicular adenopathy.  Skin:    General: Skin is warm.     Findings: No erythema or rash.  Neurological:     General: No focal deficit present.     Mental Status: She is alert and oriented to person, place, and time.     Cranial Nerves: No cranial nerve deficit.     Coordination: Coordination normal.     Gait: Gait normal.     Deep Tendon Reflexes:     Reflex Scores:      Bicep reflexes are 2+ on the right side and 2+ on the left side.  Patellar reflexes are 2+ on the right side and 2+ on the left  side. Psychiatric:        Mood and Affect: Mood and affect normal.   ASSESSMENT AND PLAN: [name] was here today annual physical examination. Orders Placed This Encounter  Procedures   Basic metabolic panel   Hepatic function panel   Hemoglobin A1c   Lipid panel   Vitamin B12   TSH   Ambulatory referral to Cardiology   Routine general medical examination at a health care facility Assessment & Plan: We discussed the importance of regular physical activity and healthy diet for prevention of chronic illness and/or complications. Preventive guidelines reviewed. Vaccination up-to-date, she completed shingles vaccine. Continue following with gynecologist for her female preventive care. Next CPE in a year.   Hypothyroidism (acquired) Assessment & Plan: Continue levothyroxine 25 mcg daily. Last TSH 1.2 in 06/2021.  Orders: -     TSH; Future  Hyperlipidemia, unspecified hyperlipidemia type Assessment & Plan: Non pharmacologic treatment recommended for now. Further recommendations will be given according to 10 years CVD risk score and lipid panel numbers.  Orders: -     Basic metabolic panel; Future -     Hepatic function panel; Future -     Lipid panel; Future  B12 deficiency Assessment & Plan: Continue B12 1000 mcg IM every 4 to 5 weeks. Further recommendation will be given according to B12 result.  Orders: -     Vitamin B12; Future  Palpitations Assessment & Plan: History and examination today do not suggest a serious process. Her wife is very concerned about episodes of CP, which seem to be caused by GERD, as well as some episodes of shortness of breath, reported as improved. Referral to cardiology placed. Instructed about warning signs.  Orders: -     Ambulatory referral to Cardiology -     Basic metabolic panel; Future  Prediabetes Assessment & Plan: Encouraged a healthy lifestyle for diabetes prevention. Further recommendation will be given  according to hemoglobin A1c result.  Orders: -     Hemoglobin A1c; Future  Bipolar affective disorder, currently depressed, mild (HCC)  Depression, major, recurrent, in partial remission ([place]) Assessment & Plan: Continue following with psychiatrist.   Class 2 severe obesity with serious comorbidity and body mass index (BMI) of 38.0 to 38.9 in adult, unspecified obesity type [facility]) Assessment & Plan: She has gained about 20 pounds since 12/2021. She understands the benefits of wt loss as well as adverse effects of obesity. Consistency with healthy diet and physical activity encouraged.    Gastroesophageal reflux disease, unspecified whether esophagitis present Assessment & Plan: Problem definitely can be contributing to CP episodes. GERD precautions recommended. Resume Protonix. Continue following with GI.   Return in 1 year (on 10/04/2023).  Ahren Pettinger G. Martinique, MD  [facility]. [place] office.

## 2022-10-02 DIAGNOSIS — Z0000 Encounter for general adult medical examination without abnormal findings: Secondary | ICD-10-CM | POA: Insufficient documentation

## 2022-10-02 DIAGNOSIS — E785 Hyperlipidemia, unspecified: Secondary | ICD-10-CM | POA: Insufficient documentation

## 2022-10-03 ENCOUNTER — Ambulatory Visit: Payer: No Typology Code available for payment source | Admitting: Family Medicine

## 2022-10-03 ENCOUNTER — Encounter: Payer: Self-pay | Admitting: Family Medicine

## 2022-10-03 VITALS — BP 124/80 | HR 80 | Temp 97.9°F | Resp 12 | Ht 66.0 in | Wt 247.5 lb

## 2022-10-03 DIAGNOSIS — R002 Palpitations: Secondary | ICD-10-CM | POA: Diagnosis not present

## 2022-10-03 DIAGNOSIS — K219 Gastro-esophageal reflux disease without esophagitis: Secondary | ICD-10-CM

## 2022-10-03 DIAGNOSIS — E039 Hypothyroidism, unspecified: Secondary | ICD-10-CM | POA: Diagnosis not present

## 2022-10-03 DIAGNOSIS — Z0000 Encounter for general adult medical examination without abnormal findings: Secondary | ICD-10-CM

## 2022-10-03 DIAGNOSIS — E785 Hyperlipidemia, unspecified: Secondary | ICD-10-CM

## 2022-10-03 DIAGNOSIS — R7303 Prediabetes: Secondary | ICD-10-CM | POA: Diagnosis not present

## 2022-10-03 DIAGNOSIS — F3341 Major depressive disorder, recurrent, in partial remission: Secondary | ICD-10-CM

## 2022-10-03 DIAGNOSIS — E538 Deficiency of other specified B group vitamins: Secondary | ICD-10-CM

## 2022-10-03 DIAGNOSIS — Z6838 Body mass index (BMI) 38.0-38.9, adult: Secondary | ICD-10-CM

## 2022-10-03 DIAGNOSIS — R748 Abnormal levels of other serum enzymes: Secondary | ICD-10-CM

## 2022-10-03 DIAGNOSIS — F3131 Bipolar disorder, current episode depressed, mild: Secondary | ICD-10-CM

## 2022-10-03 LAB — BASIC METABOLIC PANEL
BUN: 10 mg/dL (ref 6–23)
CO2: 28 mEq/L (ref 19–32)
Calcium: 9.6 mg/dL (ref 8.4–10.5)
Chloride: 100 mEq/L (ref 96–112)
Creatinine, Ser: 0.82 mg/dL (ref 0.40–1.20)
GFR: 83.2 mL/min (ref >60.00)
Glucose, Bld: 103 mg/dL — ABNORMAL HIGH (ref 70–99)
Potassium: 4.1 mEq/L (ref 3.5–5.1)
Sodium: 136 mEq/L (ref 135–145)

## 2022-10-03 LAB — LIPID PANEL
Cholesterol: 223 mg/dL — ABNORMAL HIGH (ref 0–200)
HDL: 73.4 mg/dL (ref >39.00)
LDL Cholesterol: 134 mg/dL — ABNORMAL HIGH (ref 0–99)
NonHDL: 149.45
Total CHOL/HDL Ratio: 3
Triglycerides: 77 mg/dL (ref 0.0–149.0)
VLDL: 15.4 mg/dL (ref 0.0–40.0)

## 2022-10-03 LAB — HEPATIC FUNCTION PANEL
ALT: 16 U/L (ref 0–35)
AST: 13 U/L (ref 0–37)
Albumin: 4.2 g/dL (ref 3.5–5.2)
Alkaline Phosphatase: 167 U/L — ABNORMAL HIGH (ref 39–117)
Bilirubin, Direct: 0.1 mg/dL (ref 0.0–0.3)
Total Bilirubin: 0.3 mg/dL (ref 0.2–1.2)
Total Protein: 7.5 g/dL (ref 6.0–8.3)

## 2022-10-03 LAB — VITAMIN B12: Vitamin B-12: 522 pg/mL (ref 211–911)

## 2022-10-03 LAB — TSH: TSH: 2.7 u[IU]/mL (ref 0.35–5.50)

## 2022-10-03 LAB — HEMOGLOBIN A1C: Hgb A1c MFr Bld: 6.1 % (ref 4.6–6.5)

## 2022-10-03 NOTE — Assessment & Plan Note (Signed)
We discussed the importance of regular physical activity and healthy diet for prevention of chronic illness and/or complications. Preventive guidelines reviewed. Vaccination up-to-date, she completed shingles vaccine. Continue following with gynecologist for her female preventive care. Next CPE in a year.

## 2022-10-03 NOTE — Assessment & Plan Note (Signed)
History and examination today do not suggest a serious process. Her wife is very concerned about episodes of CP, which seem to be caused by GERD, as well as some episodes of shortness of breath, reported as improved. Referral to cardiology placed. Instructed about warning signs.

## 2022-10-03 NOTE — Assessment & Plan Note (Signed)
Continue levothyroxine 25 mcg daily. Last TSH 1.2 in 06/2021.

## 2022-10-03 NOTE — Patient Instructions (Addendum)
A few things to remember from today's visit:  Routine general medical examination at a health care facility  Hypothyroidism (acquired) - Plan: TSH  Hyperlipidemia, unspecified hyperlipidemia type - Plan: Basic metabolic panel, Hepatic function panel, Lipid panel  B12 deficiency - Plan: Vitamin B12  Palpitations - Plan: Ambulatory referral to Cardiology, Basic metabolic panel  Prediabetes - Plan: Hemoglobin A1c  Do not use My Chart to request refills or for acute issues that need immediate attention. If you send a my chart message, it may take a few days to be addressed, specially if I am not in the office.  Please be sure medication list is accurate. If a new problem present, please set up appointment sooner than planned today.  Health Maintenance, Female Adopting a healthy lifestyle and getting preventive care are important in promoting health and wellness. Ask your health care provider about: The right schedule for you to have regular tests and exams. Things you can do on your own to prevent diseases and keep yourself healthy. What should I know about diet, weight, and exercise? Eat a healthy diet  Eat a diet that includes plenty of vegetables, fruits, low-fat dairy products, and lean protein. Do not eat a lot of foods that are high in solid fats, added sugars, or sodium. Maintain a healthy weight Body mass index (BMI) is used to identify weight problems. It estimates body fat based on height and weight. Your health care provider can help determine your BMI and help you achieve or maintain a healthy weight. Get regular exercise Get regular exercise. This is one of the most important things you can do for your health. Most adults should: Exercise for at least 150 minutes each week. The exercise should increase your heart rate and make you sweat (moderate-intensity exercise). Do strengthening exercises at least twice a week. This is in addition to the moderate-intensity  exercise. Spend less time sitting. Even light physical activity can be beneficial. Watch cholesterol and blood lipids Have your blood tested for lipids and cholesterol at 51 years of age, then have this test every 5 years. Have your cholesterol levels checked more often if: Your lipid or cholesterol levels are high. You are older than 51 years of age. You are at high risk for heart disease. What should I know about cancer screening? Depending on your health history and family history, you may need to have cancer screening at various ages. This may include screening for: Breast cancer. Cervical cancer. Colorectal cancer. Skin cancer. Lung cancer. What should I know about heart disease, diabetes, and high blood pressure? Blood pressure and heart disease High blood pressure causes heart disease and increases the risk of stroke. This is more likely to develop in people who have high blood pressure readings or are overweight. Have your blood pressure checked: Every 3-5 years if you are 42-62 years of age. Every year if you are 45 years old or older. Diabetes Have regular diabetes screenings. This checks your fasting blood sugar level. Have the screening done: Once every three years after age 81 if you are at a normal weight and have a low risk for diabetes. More often and at a younger age if you are overweight or have a high risk for diabetes. What should I know about preventing infection? Hepatitis B If you have a higher risk for hepatitis B, you should be screened for this virus. Talk with your health care provider to find out if you are at risk for hepatitis B infection. Hepatitis  C Testing is recommended for: Everyone born from [birth year] through 1964. Anyone with known risk factors for hepatitis C. Sexually transmitted infections (STIs) Get screened for STIs, including gonorrhea and chlamydia, if: You are sexually active and are younger than 51 years of age. You are older than 51 years  of age and your health care provider tells you that you are at risk for this type of infection. Your sexual activity has changed since you were last screened, and you are at increased risk for chlamydia or gonorrhea. Ask your health care provider if you are at risk. Ask your health care provider about whether you are at high risk for HIV. Your health care provider may recommend a prescription medicine to help prevent HIV infection. If you choose to take medicine to prevent HIV, you should first get tested for HIV. You should then be tested every 3 months for as long as you are taking the medicine. Pregnancy If you are about to stop having your period (premenopausal) and you may become pregnant, seek counseling before you get pregnant. Take 400 to 800 micrograms (mcg) of folic acid every day if you become pregnant. Ask for birth control (contraception) if you want to prevent pregnancy. Osteoporosis and menopause Osteoporosis is a disease in which the bones lose minerals and strength with aging. This can result in bone fractures. If you are 25 years old or older, or if you are at risk for osteoporosis and fractures, ask your health care provider if you should: Be screened for bone loss. Take a calcium or vitamin D supplement to lower your risk of fractures. Be given hormone replacement therapy (HRT) to treat symptoms of menopause. Follow these instructions at home: Alcohol use Do not drink alcohol if: Your health care provider tells you not to drink. You are pregnant, may be pregnant, or are planning to become pregnant. If you drink alcohol: Limit how much you have to: 0-1 drink a day. Know how much alcohol is in your drink. In the [place] one drink equals one 12 oz bottle of beer (355 mL), one 5 oz glass of wine (148 mL), or one 1 oz glass of hard liquor (44 mL). Lifestyle Do not use any products that contain nicotine or tobacco. These products include cigarettes, chewing tobacco, and vaping  devices, such as e-cigarettes. If you need help quitting, ask your health care provider. Do not use street drugs. Do not share needles. Ask your health care provider for help if you need support or information about quitting drugs. General instructions Schedule regular health, dental, and eye exams. Stay current with your vaccines. Tell your health care provider if: You often feel depressed. You have ever been abused or do not feel safe at home. Summary Adopting a healthy lifestyle and getting preventive care are important in promoting health and wellness. Follow your health care provider's instructions about healthy diet, exercising, and getting tested or screened for diseases. Follow your health care provider's instructions on monitoring your cholesterol and blood pressure. This information is not intended to replace advice given to you by your health care provider. Make sure you discuss any questions you have with your health care provider. Document Revised: 12/24/2020 Document Reviewed: 12/24/2020 Elsevier Patient Education  [employer].

## 2022-10-03 NOTE — Assessment & Plan Note (Signed)
Continue following with psychiatrist.

## 2022-10-03 NOTE — Assessment & Plan Note (Signed)
Problem definitely can be contributing to CP episodes. GERD precautions recommended. Resume Protonix. Continue following with GI.

## 2022-10-03 NOTE — Assessment & Plan Note (Signed)
She has gained about 20 pounds since 12/2021. She understands the benefits of wt loss as well as adverse effects of obesity. Consistency with healthy diet and physical activity encouraged.

## 2022-10-03 NOTE — Assessment & Plan Note (Signed)
Encouraged a healthy lifestyle for diabetes prevention. Further recommendation will be given according to hemoglobin A1c result.

## 2022-10-03 NOTE — Assessment & Plan Note (Signed)
Continue B12 1000 mcg IM every 4 to 5 weeks. Further recommendation will be given according to B12 result.

## 2022-10-03 NOTE — Assessment & Plan Note (Signed)
Non pharmacologic treatment recommended for now. Further recommendations will be given according to 10 years CVD risk score and lipid panel numbers. 

## 2022-10-04 NOTE — Addendum Note (Signed)
Addended by: Martin[place], Adaleen Hulgan G on: 10/04/2022 11:16 PM   Modules accepted: Orders

## 2022-10-06 ENCOUNTER — Other Ambulatory Visit: Payer: Self-pay | Admitting: Obstetrics and Gynecology

## 2022-10-06 DIAGNOSIS — N644 Mastodynia: Secondary | ICD-10-CM

## 2022-10-17 ENCOUNTER — Ambulatory Visit: Payer: No Typology Code available for payment source

## 2022-10-17 ENCOUNTER — Ambulatory Visit
Admission: RE | Admit: 2022-10-17 | Discharge: 2022-10-17 | Disposition: A | Discharge status: Home / Self Care | Payer: No Typology Code available for payment source | Source: Ambulatory Visit | Attending: Obstetrics and Gynecology | Admitting: Obstetrics and Gynecology

## 2022-10-17 DIAGNOSIS — N644 Mastodynia: Secondary | ICD-10-CM

## 2022-10-28 ENCOUNTER — Encounter (HOSPITAL_BASED_OUTPATIENT_CLINIC_OR_DEPARTMENT_OTHER): Payer: Self-pay | Admitting: Pharmacist

## 2022-10-28 ENCOUNTER — Other Ambulatory Visit (HOSPITAL_BASED_OUTPATIENT_CLINIC_OR_DEPARTMENT_OTHER): Payer: Self-pay

## 2022-10-28 MED ORDER — WEGOVY 0.25 MG/0.5ML ~~LOC~~ SOAJ
0.2500 mg | SUBCUTANEOUS | 1 refills | Status: AC
  Filled 2022-10-28: qty 2, 28d supply, fill #0

## 2022-10-29 ENCOUNTER — Other Ambulatory Visit (HOSPITAL_BASED_OUTPATIENT_CLINIC_OR_DEPARTMENT_OTHER): Payer: Self-pay

## 2022-10-31 ENCOUNTER — Ambulatory Visit: Payer: No Typology Code available for payment source | Admitting: Cardiology

## 2022-11-18 ENCOUNTER — Encounter: Payer: Self-pay | Admitting: Cardiovascular Disease

## 2022-11-18 ENCOUNTER — Ambulatory Visit: Payer: No Typology Code available for payment source | Attending: Cardiology | Admitting: Cardiovascular Disease

## 2022-11-18 VITALS — BP 123/85 | HR 73 | Ht 65.0 in | Wt 252.0 lb

## 2022-11-18 DIAGNOSIS — R002 Palpitations: Secondary | ICD-10-CM

## 2022-11-18 NOTE — Progress Notes (Signed)
Cardiology Office Note:    Date:  11/18/2022   ID:  [name], DOB 01/11/1971, MRN [id number]  PCP:  [place], Betty G, [place] [facility] Cardiologist:  Mertie Moores, MD     Referring MD: Martinique, Betty G, MD   Chief Complaint  Patient presents with   Palpitations    History of Present Illness:    Seen with wife, [name] is a 52 y.o. female with a hx of  anxiety, bipolar disease, palpitations She was seen in December, 2017 by Dr. Sallyanne Kuster for episodes of falling, lightheadedness and malaise.  She was found to be bradycardic.  Her bradycardia was thought to be due to medications for her bipolar disease.   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[employer], Designer, television/film set)   Does not do much exercise  Does housework Walks some  Is prescribed Murphy but has not started yet on care need to as well as I can jump up and down it happens-   Past Medical History:  Diagnosis Date   Anxiety    Bipolar disorder    Depression    History of kidney stones    Seizures ~ 2009   "from taking Wellbutrin"   Symptomatic bradycardia     Past Surgical History:  Procedure Laterality Date   COLONOSCOPY  05/2001   /notes 06/04/2001   ESOPHAGOGASTRODUODENOSCOPY  11/2002   Archie Endo 12/05/2002   EYE SURGERY     LAPAROSCOPIC CHOLECYSTECTOMY  12/2002   LITHOTRIPSY  X [place]; 1997   "both eyes; right eye"   TONSILLECTOMY  1980    Current Medications: Current Meds  Medication Sig   AZSTARYS 52.3-10.4 MG CAPS Take 1 capsule by mouth every morning.   baclofen (LIORESAL) 10 MG tablet Take 0.5-1 tablets (5-10 mg total) by mouth 3 (three) times daily as needed for muscle spasms.    cyanocobalamin (VITAMIN B12) 1000 MCG/ML injection Inject 1 ml (1000 mcg) every 4-5 weeks.   DULoxetine (CYMBALTA) 60 MG capsule Take 60 mg by mouth 2 (two) times daily.   levothyroxine (SYNTHROID) 25 MCG tablet Take 25 mcg by mouth daily.   LORazepam (ATIVAN) 1 MG tablet Take 1 mg by mouth every 6 (six) hours as needed for anxiety.   methocarbamol (ROBAXIN) 500 MG tablet Take 1 tablet (500 mg total) by mouth 2 (two) times daily as needed for muscle spasms.   methylphenidate (RITALIN) 20 MG tablet Take 20 mg by mouth daily as needed.   Multiple Vitamin (MULTI-VITAMIN DAILY Shields) Take by mouth.   nabumetone (RELAFEN) 500 MG tablet Take 1 tablet (500 mg total) by mouth 2 (two) times daily as needed.   OXcarbazepine (TRILEPTAL) 150 MG tablet Take 300 mg by mouth at bedtime.   pantoprazole (PROTONIX) 40 MG tablet Take 1 tablet (40 mg total) by mouth daily.   Semaglutide-Weight Management (WEGOVY) 0.25 MG/0.5ML SOAJ Inject 0.25 mg into the skin once a week.   Syringe/Needle, Disp, (SYRINGE 3CC/25GX1") 25G X 1" 3 ML MISC To use with B12   VRAYLAR capsule Take 3 mg by mouth at bedtime.     Allergies:   Ambien [zolpidem tartrate], Ciprofloxacin, and Wellbutrin [bupropion]  Social History   Socioeconomic History   Marital status: Married    Spouse name: Not on file   Number of children: Not on file   Years of education: Not on file   Highest education level: Not on file  Occupational History   Not on file  Tobacco Use   Smoking status: Former    Types: Cigarettes    Quit date: 10/15/2021    Years since quitting: 1.0   Smokeless tobacco: Never  Vaping Use   Vaping Use: Never used  Substance and Sexual Activity   Alcohol use: Yes    Comment: occ   Drug use: No   Sexual activity: Not on file  Other Topics Concern   Not on file  Social History Narrative   Not on file   Social Determinants of Health   Financial Resource Strain: Not on file  Food Insecurity: Not on file   Transportation Needs: Not on file  Physical Activity: Not on file  Stress: Not on file  Social Connections: Not on file     Family History: The patient's family history is not on file.  ROS:   Please see the history of present illness.     All other systems reviewed and are negative.  EKGs/Labs/Other Studies Reviewed:    The following studies were reviewed today:   EKG: November 18, 2022: Normal sinus rhythm at 73.  Minimal voltage criteria for LVH.  Poor R wave progression-likely due to lead placement.  Recent Labs: 10/03/2022: ALT 16; BUN 10; Creatinine, Ser 0.82; Potassium 4.1; Sodium 136; TSH 2.70  Recent Lipid Panel    Component Value Date/Time   CHOL 223 (H) 10/03/2022 0845   TRIG 77.0 10/03/2022 0845   HDL 73.40 10/03/2022 0845   CHOLHDL 3 10/03/2022 0845   VLDL 15.4 10/03/2022 0845   LDLCALC 134 (H) 10/03/2022 0845     Risk Assessment/Calculations:                Physical Exam:    VS:  BP 123/85   Pulse 73   Ht 5\' 5"  (1.651 m)   Wt 252 lb (114.3 kg)   SpO2 99%   BMI 41.93 kg/m     Wt Readings from Last 3 Encounters:  11/18/22 252 lb (114.3 kg)  10/03/22 247 lb 8 oz (112.3 kg)  01/08/22 227 lb (103 kg)     GEN: moderately obese female ,  in no acute distress HEENT: Normal NECK: No JVD; No carotid bruits LYMPHATICS: No lymphadenopathy CARDIAC: RRR, no murmurs, rubs, gallops RESPIRATORY:  Clear to auscultation without rales, wheezing or rhonchi  ABDOMEN: Soft, non-tender, non-distended MUSCULOSKELETAL:  No edema; No deformity  SKIN: Warm and dry NEUROLOGIC:  Alert and oriented x 3 PSYCHIATRIC:  Normal affect   ASSESSMENT:    No diagnosis found. PLAN:    In order of problems listed above:  Clinically her palpitations sound like premature ventricular contractions.  They typically occur when she is at rest and they typically occur in the evening.  They last for just a second or so.  I have reassured her that these are almost certainly benign.   We discussed the fact that I could place a monitor on her and we would find out exactly what these are but I am not sure that she needs to pay the money to wear the monitor.  I encouraged her to work on weight loss.  She needs to improve her diet.  She needs to exercise.  Her partner thinks that she may have sleep apnea.  I encouraged her to discuss this with her medical doctor.  Will see her on an as-needed basis.          Medication Adjustments/Labs and Tests Ordered: Current medicines are reviewed at length with the patient today.  Concerns regarding medicines are outlined above.  No orders of the defined types were placed in this encounter.  No orders of the defined types were placed in this encounter.   There are no Patient Instructions on file for this visit.   Signed, Mertie Moores, MD  11/18/2022 11:33 AM    [address]

## 2022-11-18 NOTE — Patient Instructions (Signed)
Medication Instructions:  Your physician recommends that you continue on your current medications as directed. Please refer to the Current Medication list given to you today.  *If you need a refill on your cardiac medications before your next appointment, please call your pharmacy*  Follow-Up: At [facility], you and your health needs are our priority.  As part of our continuing mission to provide you with exceptional heart care, we have created designated Provider Care Teams.  These Care Teams include your primary Cardiologist (physician) and Advanced Practice Providers (APPs -  Physician Assistants and Nurse Practitioners) who all work together to provide you with the care you need, when you need it.  Your next appointment:   Follow up as needed with Dr. Acie Fredrickson

## 2023-01-16 ENCOUNTER — Ambulatory Visit: Payer: No Typology Code available for payment source | Admitting: Family Medicine

## 2023-01-20 ENCOUNTER — Other Ambulatory Visit: Payer: Self-pay | Admitting: Family Medicine

## 2023-02-18 ENCOUNTER — Encounter: Payer: Self-pay | Admitting: Family Medicine

## 2023-02-18 ENCOUNTER — Ambulatory Visit: Payer: No Typology Code available for payment source | Admitting: Family Medicine

## 2023-02-18 VITALS — BP 98/62 | HR 75 | Temp 97.7°F | Wt 238.0 lb

## 2023-02-18 DIAGNOSIS — H6992 Unspecified Eustachian tube disorder, left ear: Secondary | ICD-10-CM

## 2023-02-18 NOTE — Progress Notes (Signed)
   Subjective:    Patient ID: [name], female    DOB: December 01, 1970, 52 y.o.   MRN: [id number]  HPI Here for 3 days of "clicking" in the left ear, and now with pain in the left ear. No URI symptoms. Using Flonase daily.    Review of Systems  Constitutional: Negative.   HENT:  Positive for ear pain and hearing loss. Negative for congestion, postnasal drip, sinus pressure and sore throat.   Eyes: Negative.   Respiratory: Negative.         Objective:   Physical Exam Constitutional:      General: She is not in acute distress.    Appearance: Normal appearance.  HENT:     Right Ear: Tympanic membrane, ear canal and external ear normal.     Left Ear: Tympanic membrane, ear canal and external ear normal.     Nose: Nose normal.     Mouth/Throat:     Pharynx: No oropharyngeal exudate.  Eyes:     Conjunctiva/sclera: Conjunctivae normal.  Pulmonary:     Effort: Pulmonary effort is normal.     Breath sounds: Normal breath sounds.  Lymphadenopathy:     Cervical: No cervical adenopathy.  Neurological:     Mental Status: She is alert.           Assessment & Plan:  Eustachian tube dysfunction. She will continue to use Flonase, and she will add Zyrtec D BID until this has stopped.  Gershon Crane, MD

## 2023-03-04 ENCOUNTER — Other Ambulatory Visit (HOSPITAL_BASED_OUTPATIENT_CLINIC_OR_DEPARTMENT_OTHER): Payer: Self-pay

## 2023-03-12 ENCOUNTER — Other Ambulatory Visit (HOSPITAL_BASED_OUTPATIENT_CLINIC_OR_DEPARTMENT_OTHER): Payer: Self-pay

## 2023-04-22 ENCOUNTER — Telehealth: Payer: Self-pay

## 2023-04-22 ENCOUNTER — Other Ambulatory Visit (HOSPITAL_BASED_OUTPATIENT_CLINIC_OR_DEPARTMENT_OTHER): Payer: Self-pay

## 2023-04-22 ENCOUNTER — Other Ambulatory Visit: Payer: Self-pay | Admitting: Physical Medicine and Rehabilitation

## 2023-04-22 DIAGNOSIS — M47816 Spondylosis without myelopathy or radiculopathy, lumbar region: Secondary | ICD-10-CM

## 2023-04-22 DIAGNOSIS — G8929 Other chronic pain: Secondary | ICD-10-CM

## 2023-04-22 NOTE — Telephone Encounter (Signed)
Patient called in stating her pain is coming back. She states the pain is worse when lying or sitting down. She wants to know if she needs another RFA. Last RFA was 09/16/22. Please advise

## 2023-04-24 NOTE — Progress Notes (Deleted)
ACUTE VISIT No chief complaint on file.  HPI: [name] is a 52 y.o. female, who is here today complaining of *** HPI  Review of Systems See other pertinent positives and negatives in HPI.  Current Outpatient Medications on File Prior to Visit  Medication Sig Dispense Refill   AZSTARYS 52.3-10.4 MG CAPS Take 1 capsule by mouth every morning.     cyanocobalamin (VITAMIN B12) 1000 MCG/ML injection INJECT 1 ML (1000 MCG) EVERY 4-5 WEEKS. 3 mL 3   Dexmethylphenidate HCl 35 MG CP24 Take 1 capsule by mouth every morning.     DULoxetine (CYMBALTA) 60 MG capsule Take 60 mg by mouth 2 (two) times daily.     levothyroxine (SYNTHROID) 25 MCG tablet Take 25 mcg by mouth daily.     LORazepam (ATIVAN) 1 MG tablet Take 1 mg by mouth every 6 (six) hours as needed for anxiety.     methylphenidate (RITALIN) 20 MG tablet Take 20 mg by mouth daily as needed.     Multiple Vitamin (MULTI-VITAMIN DAILY PO) Take by mouth.     OXcarbazepine (TRILEPTAL) 150 MG tablet Take 300 mg by mouth at bedtime.     pantoprazole (PROTONIX) 40 MG tablet Take 1 tablet (40 mg total) by mouth daily. 30 tablet 3   Semaglutide-Weight Management (WEGOVY) 0.25 MG/0.5ML SOAJ Inject 0.25 mg into the skin once a week. 2 mL 1   Syringe/Needle, Disp, (SYRINGE 3CC/25GX1") 25G X 1" 3 ML MISC To use with B12 100 each 2   VRAYLAR capsule Take 3 mg by mouth at bedtime.     No current facility-administered medications on file prior to visit.    Past Medical History:  Diagnosis Date   Anxiety    Bipolar disorder (HCC)    Depression    History of kidney stones    Seizures (HCC) ~ 2009   "from taking Wellbutrin"   Symptomatic bradycardia    Allergies  Allergen Reactions   Ambien [Zolpidem Tartrate]     Seizure    Ciprofloxacin Anaphylaxis   Wellbutrin [Bupropion]     Seizure    Social History   Socioeconomic History   Marital status: Married    Spouse name: Not on file   Number of children: Not on file   Years  of education: Not on file   Highest education level: Not on file  Occupational History   Not on file  Tobacco Use   Smoking status: Former    Current packs/day: 0.00    Types: Cigarettes    Quit date: 10/15/2021    Years since quitting: 1.5   Smokeless tobacco: Never  Vaping Use   Vaping status: Never Used  Substance and Sexual Activity   Alcohol use: Yes    Comment: occ   Drug use: No   Sexual activity: Not on file  Other Topics Concern   Not on file  Social History Narrative   Not on file   Social Determinants of Health   Financial Resource Strain: Not on file  Food Insecurity: Not on file  Transportation Needs: Not on file  Physical Activity: Not on file  Stress: Not on file  Social Connections: Not on file    There were no vitals filed for this visit. There is no height or weight on file to calculate BMI.  Physical Exam  ASSESSMENT AND PLAN: There are no diagnoses linked to this encounter.  No follow-ups on file.  Betty G. [place], MD  [facility]. [place]  office.  Discharge Instructions   None

## 2023-04-27 ENCOUNTER — Ambulatory Visit: Payer: No Typology Code available for payment source | Admitting: Family Medicine

## 2023-05-13 ENCOUNTER — Ambulatory Visit: Payer: No Typology Code available for payment source | Admitting: Physical Medicine and Rehabilitation

## 2023-05-13 ENCOUNTER — Other Ambulatory Visit: Payer: Self-pay

## 2023-05-13 VITALS — BP 115/73 | HR 75

## 2023-05-13 DIAGNOSIS — M47816 Spondylosis without myelopathy or radiculopathy, lumbar region: Secondary | ICD-10-CM

## 2023-05-13 MED ORDER — METHYLPREDNISOLONE ACETATE 80 MG/ML IJ SUSP
80.0000 mg | Freq: Once | INTRAMUSCULAR | Status: AC
  Administered 2023-05-13: 80 mg

## 2023-05-13 NOTE — Progress Notes (Signed)
Functional Pain Scale - descriptive words and definitions  Moderate (4)   Constantly aware of pain, can complete ADLs with modification/sleep marginally affected at times/passive distraction is of no use, but active distraction gives some relief. Moderate range order  Average Pain 6   +Driver, -BT, -Dye Allergies.  Lower back pain on right side

## 2023-05-13 NOTE — Patient Instructions (Signed)

## 2023-05-15 NOTE — Progress Notes (Signed)
[name] - 52 y.o. female MRN [id number]  Date of birth: 1971-05-08  Office Visit Note: Visit Date: 05/13/2023 PCP: [place], Betty G, MD Referred by: [place], Betty G, MD  Subjective: Chief Complaint  Patient presents with   Lower Back - Pain   HPI:  [name] is a 52 y.o. female who comes in todayfor planned repeat radiofrequency ablation of the Right L4-5 and L5-S1  Lumbar facet joints. This would be ablation of the corresponding medial branches and/or dorsal rami.  Patient has had double diagnostic blocks with more than 70% relief.  Subsequent ablation gave them more than 6 months of over 60% relief.  They have had chronic back pain for quite some time, more than 3 months, which has been an ongoing situation with recalcitrant axial back pain.  They have no radicular pain.  Their axial pain is worse with standing and ambulating and on exam today with facet loading.  They have had physical therapy as well as home exercise program.  The imaging noted in the chart below indicated facet pathology. Accordingly they meet all the criteria and qualification for for radiofrequency ablation and we are going to complete this today hopefully for more longer term relief as part of comprehensive management program.   ROS Otherwise per HPI.  Assessment & Plan: Visit Diagnoses:    ICD-10-CM   1. Spondylosis without myelopathy or radiculopathy, lumbar region  M47.816 XR C-ARM NO REPORT    Radiofrequency,Lumbar    methylPREDNISolone acetate (DEPO-MEDROL) injection 80 mg      Plan: No additional findings.   Meds & Orders:  Meds ordered this encounter  Medications   methylPREDNISolone acetate (DEPO-MEDROL) injection 80 mg    Orders Placed This Encounter  Procedures   Radiofrequency,Lumbar   XR C-ARM NO REPORT    Follow-up: No follow-ups on file.   Procedures: No procedures performed  Lumbar Facet Joint Nerve Denervation  [name]      Date of Birth: Oct 11, 1970 MRN:  [id number] PCP: [place], Betty G, MD      Visit Date: 05/13/2023   Universal Protocol:    Date/Time: 05/14/2409:16 AM  Consent Given By: the patient  Position: PRONE  Additional Comments: Vital signs were monitored before and after the procedure. Patient was prepped and draped in the usual sterile fashion. The correct patient, procedure, and site was verified.   Injection Procedure Details:   Procedure diagnoses:  1. Spondylosis without myelopathy or radiculopathy, lumbar region      Meds Administered:  Meds ordered this encounter  Medications   methylPREDNISolone acetate (DEPO-MEDROL) injection 80 mg     Laterality: Right  Location/Site:  L4-L5, L3 and L4 medial branches and L5-S1, L4 medial branch and L5 dorsal ramus  Needle: 18 ga.,  10mm active tip, RF Cannula  Needle Placement: Along juncture of superior articular process and transverse pocess  Findings:  -Comments:  Procedure Details: For each desired target nerve, the corresponding transverse process (sacral ala for the L5 dorsal rami) was identified and the fluoroscope was positioned to square off the endplates of the corresponding vertebral body to achieve a true AP midline view.  The beam was then obliqued 15 to 20 degrees and caudally tilted 15 to 20 degrees to line up a trajectory along the target nerves. The skin over the target of the junction of superior articulating process and transverse process (sacral ala for the L5 dorsal rami) was infiltrated with 1ml of 1% Lidocaine without Epinephrine.  The 18  gauge 10mm active tip outer cannula was advanced in trajectory view to the target.  This procedure was repeated for each target nerve.  Then, for all levels, the outer cannula placement was fine-tuned and the position was then confirmed with bi-planar imaging.    Test stimulation was done both at sensory and motor levels to ensure there was no radicular stimulation. The target tissues were then infiltrated  with 1 ml of 1% Lidocaine without Epinephrine. Subsequently, a percutaneous neurotomy was carried out for 90 seconds at 80 degrees Celsius.  After the completion of the lesion, 1 ml of injectate was delivered. It was then repeated for each facet joint nerve mentioned above. Appropriate radiographs were obtained to verify the probe placement during the neurotomy.   Additional Comments:  No complications occurred Dressing: 2 x 2 sterile gauze and Band-Aid    Post-procedure details: Patient was observed during the procedure. Post-procedure instructions were reviewed.  Patient left the clinic in stable condition.      Clinical History: No specialty comments available.     Objective:  VS:  HT:    WT:   BMI:     BP:115/73  HR:75bpm  TEMP: ( )  RESP:  Physical Exam Vitals and nursing note reviewed.  Constitutional:      General: She is not in acute distress.    Appearance: Normal appearance. She is not ill-appearing.  HENT:     Head: Normocephalic and atraumatic.     Right Ear: External ear normal.     Left Ear: External ear normal.  Eyes:     Extraocular Movements: Extraocular movements intact.  Cardiovascular:     Rate and Rhythm: Normal rate.     Pulses: Normal pulses.  Pulmonary:     Effort: Pulmonary effort is normal. No respiratory distress.  Abdominal:     General: There is no distension.     Palpations: Abdomen is soft.  Musculoskeletal:        General: Tenderness present.     Cervical back: Neck supple.     Right lower leg: No edema.     Left lower leg: No edema.     Comments: Patient has good distal strength with no pain over the greater trochanters.  No clonus or focal weakness.  Skin:    Findings: No erythema, lesion or rash.  Neurological:     General: No focal deficit present.     Mental Status: She is alert and oriented to person, place, and time.     Sensory: No sensory deficit.     Motor: No weakness or abnormal muscle tone.     Coordination:  Coordination normal.  Psychiatric:        Mood and Affect: Mood normal.        Behavior: Behavior normal.      Imaging: No results found.

## 2023-05-15 NOTE — Procedures (Signed)
Lumbar Facet Joint Nerve Denervation  [name]      Date of Birth: 19-Dec-1970 MRN: [id number] PCP: [place], Betty G, MD      Visit Date: 05/13/2023   Universal Protocol:    Date/Time: 05/14/2409:16 AM  Consent Given By: the patient  Position: PRONE  Additional Comments: Vital signs were monitored before and after the procedure. Patient was prepped and draped in the usual sterile fashion. The correct patient, procedure, and site was verified.   Injection Procedure Details:   Procedure diagnoses:  1. Spondylosis without myelopathy or radiculopathy, lumbar region      Meds Administered:  Meds ordered this encounter  Medications   methylPREDNISolone acetate (DEPO-MEDROL) injection 80 mg     Laterality: Right  Location/Site:  L4-L5, L3 and L4 medial branches and L5-S1, L4 medial branch and L5 dorsal ramus  Needle: 18 ga.,  10mm active tip, RF Cannula  Needle Placement: Along juncture of superior articular process and transverse pocess  Findings:  -Comments:  Procedure Details: For each desired target nerve, the corresponding transverse process (sacral ala for the L5 dorsal rami) was identified and the fluoroscope was positioned to square off the endplates of the corresponding vertebral body to achieve a true AP midline view.  The beam was then obliqued 15 to 20 degrees and caudally tilted 15 to 20 degrees to line up a trajectory along the target nerves. The skin over the target of the junction of superior articulating process and transverse process (sacral ala for the L5 dorsal rami) was infiltrated with 1ml of 1% Lidocaine without Epinephrine.  The 18 gauge 10mm active tip outer cannula was advanced in trajectory view to the target.  This procedure was repeated for each target nerve.  Then, for all levels, the outer cannula placement was fine-tuned and the position was then confirmed with bi-planar imaging.    Test stimulation was done both at sensory  and motor levels to ensure there was no radicular stimulation. The target tissues were then infiltrated with 1 ml of 1% Lidocaine without Epinephrine. Subsequently, a percutaneous neurotomy was carried out for 90 seconds at 80 degrees Celsius.  After the completion of the lesion, 1 ml of injectate was delivered. It was then repeated for each facet joint nerve mentioned above. Appropriate radiographs were obtained to verify the probe placement during the neurotomy.   Additional Comments:  No complications occurred Dressing: 2 x 2 sterile gauze and Band-Aid    Post-procedure details: Patient was observed during the procedure. Post-procedure instructions were reviewed.  Patient left the clinic in stable condition.

## 2023-06-11 ENCOUNTER — Encounter: Payer: Self-pay | Admitting: Family Medicine

## 2023-06-11 ENCOUNTER — Ambulatory Visit: Payer: No Typology Code available for payment source | Admitting: Family Medicine

## 2023-06-11 VITALS — BP 100/80 | HR 88 | Temp 98.1°F | Ht 65.0 in | Wt 234.2 lb

## 2023-06-11 DIAGNOSIS — H66002 Acute suppurative otitis media without spontaneous rupture of ear drum, left ear: Secondary | ICD-10-CM

## 2023-06-11 DIAGNOSIS — R0981 Nasal congestion: Secondary | ICD-10-CM

## 2023-06-11 DIAGNOSIS — H1033 Unspecified acute conjunctivitis, bilateral: Secondary | ICD-10-CM | POA: Diagnosis not present

## 2023-06-11 LAB — POC COVID19 BINAXNOW: SARS Coronavirus 2 Ag: NEGATIVE

## 2023-06-11 MED ORDER — AMOXICILLIN-POT CLAVULANATE 500-125 MG PO TABS: 1.0000 | ORAL_TABLET | Freq: Two times a day (BID) | ORAL | 0 refills | Status: AC

## 2023-06-11 MED ORDER — POLYMYXIN B-TRIMETHOPRIM 10000-0.1 UNIT/ML-% OP SOLN: 1.0000 [drp] | OPHTHALMIC | 0 refills | Status: DC

## 2023-06-11 NOTE — Progress Notes (Signed)
Established Patient Office Visit   Subjective  Patient ID: [name], female    DOB: [birth year]  Age: 52 y.o. MRN: [id number]  Chief Complaint  Patient presents with   Cough    Patient is a 2 female followed by Dr. Ocie Doyne for acute concern facial pain/pressure, dental pain, headache sore throat cough, and decreased appetite x 4-5 days.  Symptoms started over the weekend.  Woke up with matted, draining, red, sore eyes.  Left ear has been popping.  Not painful.  Denies itching of eyes, nausea, vomiting, fever, chills, body aches, diarrhea.  Tried Mucinex, Alka-Seltzer cold and flu, Excedrin Migraine, Tylenol.  Patient endorses her wife has been sick.  Cough    Patient Active Problem List   Diagnosis Date Noted   Palpitations 10/03/2022   B12 deficiency 10/03/2022   Prediabetes 10/03/2022   Hyperlipidemia 10/02/2022   Routine general medical examination at a health care facility 10/02/2022   GERD (gastroesophageal reflux disease) 06/26/2021   Class 2 obesity with body mass index (BMI) of 38.0 to 38.9 in adult 09/17/2020   Chronic lower back pain 12/14/2018   ADHD 12/14/2018   Hypothyroidism (acquired) 12/14/2018   Depression, major, recurrent, in partial remission (HCC) 12/14/2018   Fall    Symptomatic bradycardia 07/29/2016   Hypotension 07/29/2016   Bipolar disorder (HCC)    Past Medical History:  Diagnosis Date   Anxiety    Bipolar disorder (HCC)    Depression    History of kidney stones    Seizures (HCC) ~ 2009   "from taking Wellbutrin"   Symptomatic bradycardia    Past Surgical History:  Procedure Laterality Date   COLONOSCOPY  05/2001   /notes 06/04/2001   ESOPHAGOGASTRODUODENOSCOPY  11/2002   Hattie Perch 12/05/2002   EYE SURGERY     LAPAROSCOPIC CHOLECYSTECTOMY  12/2002   LITHOTRIPSY  X 1   STRABISMUS SURGERY  1975; 1997   "both eyes; right eye"   TONSILLECTOMY  1980   Social History   Tobacco Use   Smoking status: Former    Current packs/day:  0.00    Types: Cigarettes    Quit date: 10/15/2021    Years since quitting: 1.6   Smokeless tobacco: Never  Vaping Use   Vaping status: Never Used  Substance Use Topics   Alcohol use: Yes    Comment: occ   Drug use: No   History reviewed. No pertinent family history. Allergies  Allergen Reactions   Ambien [Zolpidem Tartrate]     Seizure    Ciprofloxacin Anaphylaxis   Wellbutrin [Bupropion]     Seizure      Review of Systems  Respiratory:  Positive for cough.    Negative unless stated above    Objective:     BP 100/80 (BP Location: Right Arm, Patient Position: Sitting, Cuff Size: Large)   Pulse 88   Temp 98.1 F (36.7 C) (Oral)   Ht 5\' 5"  (1.651 m)   Wt 234 lb 3.2 oz (106.2 kg)   LMP  (LMP Unknown)   SpO2 97%   BMI 38.97 kg/m  BP Readings from Last 3 Encounters:  06/11/23 100/80  05/13/23 115/73  02/18/23 98/62   Wt Readings from Last 3 Encounters:  06/11/23 234 lb 3.2 oz (106.2 kg)  02/18/23 238 lb (108 kg)  11/18/22 252 lb (114.3 kg)      Physical Exam Constitutional:      General: She is not in acute distress.    Appearance: Normal appearance.  HENT:     Head: Normocephalic and atraumatic.     Right Ear: External ear normal.     Left Ear: External ear normal.     Ears:     Comments: Left TM with mild suppurative fluid and erythema.  Right TM full, no erythema or supra fluid.    Nose:     Right Turbinates: Enlarged and swollen.     Left Turbinates: Enlarged.     Right Sinus: Maxillary sinus tenderness present.     Mouth/Throat:     Mouth: Mucous membranes are moist.     Pharynx: Postnasal drip present.     Tonsils: No tonsillar exudate.  Eyes:     General:        Right eye: Discharge present.        Left eye: Discharge present.    Extraocular Movements: Extraocular movements intact.     Conjunctiva/sclera:     Right eye: Right conjunctiva is injected.     Left eye: Left conjunctiva is injected.     Pupils: Pupils are equal, round, and  reactive to light.     Comments: Edema bilateral upper eyelids.  Conjunctival injection bilaterally.  Cardiovascular:     Rate and Rhythm: Normal rate and regular rhythm.     Heart sounds: Normal heart sounds. No murmur heard.    No gallop.  Pulmonary:     Effort: Pulmonary effort is normal. No respiratory distress.     Breath sounds: Normal breath sounds. No wheezing, rhonchi or rales.  Skin:    General: Skin is warm and dry.  Neurological:     Mental Status: She is alert and oriented to person, place, and time.      Results for orders placed or performed in visit on 06/11/23  POC COVID-19  Result Value Ref Range   SARS Coronavirus 2 Ag Negative Negative      Assessment & Plan:  Acute suppurative otitis media of left ear without spontaneous rupture of tympanic membrane, recurrence not specified -     Amoxicillin-Pot Clavulanate; Take 1 tablet by mouth in the morning and at bedtime for 7 days.  Dispense: 14 tablet; Refill: 0  Acute bacterial conjunctivitis of both eyes -     Polymyxin B-Trimethoprim; Place 1 drop into both eyes every 4 (four) hours.  Dispense: 10 mL; Refill: 0  Nasal congestion -     POC COVID-19 BinaxNow  Patient with URI symptoms x 4-5 days.  COVID testing negative.  Start ABX for left AOM which will also help with sinusitis likely developing.  Start Polytrim eyedrops for conjunctivitis.  Frequent handwashing encouraged.  Continue OTC supportive care with cough/cold medications, warm fluids, rest, hydration, Tylenol, etc.  Given strict precautions.  Return if symptoms worsen or fail to improve.   Deeann Saint, MD

## 2023-06-17 ENCOUNTER — Other Ambulatory Visit (HOSPITAL_BASED_OUTPATIENT_CLINIC_OR_DEPARTMENT_OTHER): Payer: Self-pay

## 2023-08-04 ENCOUNTER — Other Ambulatory Visit (HOSPITAL_BASED_OUTPATIENT_CLINIC_OR_DEPARTMENT_OTHER): Payer: Self-pay

## 2023-09-01 ENCOUNTER — Ambulatory Visit (INDEPENDENT_AMBULATORY_CARE_PROVIDER_SITE_OTHER): Payer: BC Managed Care – PPO | Admitting: Family Medicine

## 2023-09-01 VITALS — BP 126/80 | HR 76 | Resp 16 | Ht 65.0 in | Wt 222.5 lb

## 2023-09-01 DIAGNOSIS — R7303 Prediabetes: Secondary | ICD-10-CM

## 2023-09-01 DIAGNOSIS — E039 Hypothyroidism, unspecified: Secondary | ICD-10-CM | POA: Diagnosis not present

## 2023-09-01 DIAGNOSIS — R748 Abnormal levels of other serum enzymes: Secondary | ICD-10-CM

## 2023-09-01 DIAGNOSIS — F3131 Bipolar disorder, current episode depressed, mild: Secondary | ICD-10-CM

## 2023-09-01 DIAGNOSIS — R5383 Other fatigue: Secondary | ICD-10-CM

## 2023-09-01 DIAGNOSIS — E538 Deficiency of other specified B group vitamins: Secondary | ICD-10-CM | POA: Diagnosis not present

## 2023-09-01 DIAGNOSIS — E559 Vitamin D deficiency, unspecified: Secondary | ICD-10-CM | POA: Diagnosis not present

## 2023-09-01 NOTE — Assessment & Plan Note (Signed)
Continue B12 1000 mcg IM every 4 to 5 weeks. Further recommendation will be given according to B12 result. 

## 2023-09-01 NOTE — Assessment & Plan Note (Signed)
 Last TSH was normal at 2.7. Continue levothyroxine 25 mcg daily. Further recommendation will be given according to TSH result.

## 2023-09-01 NOTE — Progress Notes (Signed)
 ACUTE VISIT Chief Complaint  Patient presents with   Fatigue   HPI: [name] is a 53 y.o. female with a PMHx significant for HTN, hypothyroidism, depression, bipolar disorder, and GERD, who is here today with her wife complaining of fatigue.   Patient complains of increased fatigue for the last few weeks. She says she feels rested when she wakes up, but feels tired by the mid afternoon. Negative for any new medication or stressful event.  She reports she is sleeping 6 hours per night at the most, but sometimes gets less.  Her wife does not think she sleeps well, has not noted episodes of apnea.  She doesn's believe she is snoring louder. Her wife mentions she has not had her sleep study done.  She would like to have her thyroid  checked. Hypothyroidism: Currently she is on levothyroxine 25 mcg daily.  Lab Results  Component Value Date   TSH 2.70 10/03/2022   Pertinent negatives include recent illness, fever, chills, abnormal weight loss (on wt loss medication), nausea, vomiting, abdominal pain, or recent illness.   Bipolar disorder: She sees her psychiatrist every 6 months.   Prediabetes: Negative for polydipsia, polyuria, polyphagia. She mentions she has been taking a weight loss medication, Wegovy , prescribed by her gynecologist.  Lab Results  Component Value Date   HGBA1C 6.1 10/03/2022   B12 deficiency: Currently she is on B12 at 1000 mcg every 4 to 5 weeks. Lab Results  Component Value Date   VITAMINB12 522 10/03/2022   Review of Systems  Constitutional:  Negative for chills and fever.  HENT:  Negative for sore throat and trouble swallowing.   Respiratory:  Negative for cough, shortness of breath and wheezing.   Cardiovascular:  Negative for palpitations and leg swelling.  Gastrointestinal:  Negative for abdominal pain, nausea and vomiting.  Endocrine: Negative for cold intolerance and heat intolerance.  Genitourinary:  Negative for decreased urine volume,  dysuria and hematuria.  Skin:  Negative for rash.  Neurological:  Negative for syncope and weakness.  Psychiatric/Behavioral:  Positive for sleep disturbance. Negative for confusion and hallucinations.   See other pertinent positives and negatives in HPI.  Current Outpatient Medications on File Prior to Visit  Medication Sig Dispense Refill   AZSTARYS 52.3-10.4 MG CAPS Take 1 capsule by mouth every morning.     cyanocobalamin  (VITAMIN B12) 1000 MCG/ML injection INJECT 1 ML (1000 MCG) EVERY 4-5 WEEKS. 3 mL 3   Dexmethylphenidate HCl 35 MG CP24 Take 1 capsule by mouth every morning.     DULoxetine  (CYMBALTA ) 60 MG capsule Take 60 mg by mouth 2 (two) times daily.     levothyroxine (SYNTHROID) 25 MCG tablet Take 25 mcg by mouth daily.     LORazepam  (ATIVAN ) 1 MG tablet Take 1 mg by mouth every 6 (six) hours as needed for anxiety.     methylphenidate  (RITALIN ) 20 MG tablet Take 20 mg by mouth daily as needed.     Multiple Vitamin (MULTI-VITAMIN DAILY PO) Take by mouth.     OXcarbazepine  (TRILEPTAL ) 150 MG tablet Take 300 mg by mouth at bedtime.     pantoprazole  (PROTONIX ) 40 MG tablet Take 1 tablet (40 mg total) by mouth daily. 30 tablet 3   Semaglutide -Weight Management (WEGOVY ) 0.25 MG/0.5ML SOAJ Inject 0.25 mg into the skin once a week. 2 mL 1   Syringe/Needle, Disp, (SYRINGE 3CC/25GX1) 25G X 1 3 ML MISC To use with B12 100 each 2   trimethoprim -polymyxin b  (POLYTRIM ) ophthalmic solution Place  1 drop into both eyes every 4 (four) hours. 10 mL 0   VRAYLAR capsule Take 3 mg by mouth at bedtime.     No current facility-administered medications on file prior to visit.   Past Medical History:  Diagnosis Date   Anxiety    Bipolar disorder (HCC)    Depression    History of kidney stones    Seizures (HCC) ~ 2009   from taking Wellbutrin   Symptomatic bradycardia    Allergies  Allergen Reactions   Ambien [Zolpidem Tartrate]     Seizure    Ciprofloxacin  Anaphylaxis   Wellbutrin  [Bupropion]     Seizure    Social History   Socioeconomic History   Marital status: Married    Spouse name: Not on file   Number of children: Not on file   Years of education: Not on file   Highest education level: Master's degree (e.g., MA, MS, MEng, MEd, MSW, MBA)  Occupational History   Not on file  Tobacco Use   Smoking status: Former    Current packs/day: 0.00    Types: Cigarettes    Quit date: 10/15/2021    Years since quitting: 1.8   Smokeless tobacco: Never  Vaping Use   Vaping status: Never Used  Substance and Sexual Activity   Alcohol use: Yes    Comment: occ   Drug use: No   Sexual activity: Not on file  Other Topics Concern   Not on file  Social History Narrative   Not on file   Social Drivers of Health   Financial Resource Strain: Low Risk  (09/01/2023)   Overall Financial Resource Strain (CARDIA)    Difficulty of Paying Living Expenses: Not hard at all  Food Insecurity: No Food Insecurity (09/01/2023)   Hunger Vital Sign    Worried About Running Out of Food in the Last Year: Never true    Ran Out of Food in the Last Year: Never true  Transportation Needs: No Transportation Needs (09/01/2023)   PRAPARE - Administrator, Civil Service (Medical): No    Lack of Transportation (Non-Medical): No  Physical Activity: Unknown (09/01/2023)   Exercise Vital Sign    Days of Exercise per Week: 0 days    Minutes of Exercise per Session: Not on file  Stress: No Stress Concern Present (09/01/2023)   [employer] of Occupational Health - Occupational Stress Questionnaire    Feeling of Stress : Not at all  Social Connections: Socially Integrated (09/01/2023)   Social Connection and Isolation Panel [NHANES]    Frequency of Communication with Friends and Family: More than three times a week    Frequency of Social Gatherings with Friends and Family: More than three times a week    Attends Religious Services: More than 4 times per year    Active Member of  [employer] or Organizations: Yes    Attends Banker Meetings: More than 4 times per year    Marital Status: Married   Vitals:   09/01/23 1543  BP: 126/80  Pulse: 76  Resp: 16  SpO2: 99%   Wt Readings from Last 3 Encounters:  09/01/23 222 lb 8 oz (100.9 kg)  06/11/23 234 lb 3.2 oz (106.2 kg)  02/18/23 238 lb (108 kg)   Body mass index is 37.03 kg/m.  Physical Exam Vitals and nursing note reviewed.  Constitutional:      General: She is not in acute distress.    Appearance: She is well-developed.  HENT:     Head: Normocephalic and atraumatic.     Mouth/Throat:     Mouth: Mucous membranes are moist.     Pharynx: Oropharynx is clear.  Eyes:     Conjunctiva/sclera: Conjunctivae normal.  Cardiovascular:     Rate and Rhythm: Normal rate and regular rhythm.     Pulses:          Dorsalis pedis pulses are 2+ on the right side and 2+ on the left side.     Heart sounds: No murmur heard. Pulmonary:     Effort: Pulmonary effort is normal. No respiratory distress.     Breath sounds: Normal breath sounds.  Abdominal:     Palpations: Abdomen is soft. There is no hepatomegaly or mass.     Tenderness: There is no abdominal tenderness.  Musculoskeletal:     Right lower leg: No edema.     Left lower leg: No edema.  Lymphadenopathy:     Cervical: No cervical adenopathy.  Skin:    General: Skin is warm.     Findings: No erythema or rash.  Neurological:     General: No focal deficit present.     Mental Status: She is alert and oriented to person, place, and time.     Cranial Nerves: No cranial nerve deficit.     Gait: Gait normal.  Psychiatric:        Mood and Affect: Mood and affect normal.   ASSESSMENT AND PLAN:  Ms. Limpert was seen today for fatigue.   Lab Results  Component Value Date   TSH 2.28 09/01/2023   Lab Results  Component Value Date   VITAMINB12 587 09/01/2023   Lab Results  Component Value Date   NA 139 09/01/2023   CL 102 09/01/2023   K 3.9  09/01/2023   CO2 30 09/01/2023   BUN 7 09/01/2023   CREATININE 0.70 09/01/2023   GFR 99.95 09/01/2023   CALCIUM 9.5 09/01/2023   ALBUMIN 4.2 09/01/2023   ALBUMIN 4.2 09/01/2023   GLUCOSE 85 09/01/2023   Lab Results  Component Value Date   ALT 12 09/01/2023   ALT 12 09/01/2023   AST 13 09/01/2023   AST 13 09/01/2023   ALKPHOS 147 (H) 09/01/2023   ALKPHOS 147 (H) 09/01/2023   BILITOT 0.3 09/01/2023   BILITOT 0.3 09/01/2023   Lab Results  Component Value Date   HGBA1C 5.7 09/01/2023   Lab Results  Component Value Date   WBC 10.8 (H) 09/01/2023   HGB 12.9 09/01/2023   HCT 40.0 09/01/2023   MCV 86.9 09/01/2023   PLT 337.0 09/01/2023   Fatigue, unspecified type We discussed possible etiologies: Systemic illness, immunologic,endocrinology,sleep disorder, psychiatric/psychologic, infectious,medications side effects, and idiopathic.  Some of her chronic comorbidities could be contributing factors. If problem is persistent, recommend scheduling sleep study. Examination today does not suggest a serious process. She reports all of her age-appropriate cancer screening up today. Further recommendations will be given according to lab results.  -     Comprehensive metabolic panel; Future -     TSH; Future  Prediabetes Assessment & Plan: She has lost some weight since her last visit, currently on Wegovy , which has been prescribed by her gynecologist for weight loss. Encouraged consistency with a healthy lifestyle for diabetes prevention. Last hemoglobin A1c 6.1 in 09/2022. Further recommendation will be given according to hemoglobin A1c result.  Orders: -     Comprehensive metabolic panel; Future -     Hemoglobin A1c; Future  B12 deficiency Assessment & Plan: Continue B12 1000 mcg IM every 4 to 5 weeks. Further recommendation will be given according to B12 result.  Orders: -     CBC with Differential/Platelet; Future -     Vitamin B12; Future  Hypothyroidism  (acquired) Assessment & Plan: Last TSH was normal at 2.7. Continue levothyroxine 25 mcg daily. Further recommendation will be given according to TSH result.  Orders: -     Comprehensive metabolic panel; Future -     TSH; Future  Bipolar affective disorder, currently depressed, mild (HCC) Assessment & Plan: Following with psychiatry regularly.  Orders: -     VITAMIN D  25 Hydroxy (Vit-D Deficiency, Fractures); Future  Return if symptoms worsen or fail to improve, for keep next appointment.  I, Leonce PARAS Wierda, acting as a scribe for Vardaan Depascale, MD., have documented all relevant documentation on the behalf of Adelynn Gipe, MD, as directed by  Frankey Botting, MD while in the presence of Tessi Eustache, MD.   I, Yancey Pedley, MD, have reviewed all documentation for this visit. The documentation on 09/01/23 for the exam, diagnosis, procedures, and orders are all accurate and complete.  Woods Gangemi G. Ashrith Sagan, MD  [facility]. [place] office.

## 2023-09-01 NOTE — Patient Instructions (Addendum)
 A few things to remember from today's visit:  Prediabetes - Plan: Comprehensive metabolic panel, Hemoglobin A1c  B12 deficiency - Plan: CBC with Differential/Platelet, Vitamin B12  Hypothyroidism (acquired) - Plan: Comprehensive metabolic panel, TSH  Bipolar affective disorder, currently depressed, mild (HCC), Chronic - Plan: VITAMIN D  25 Hydroxy (Vit-D Deficiency, Fractures)  Fatigue, unspecified type - Plan: Comprehensive metabolic panel, TSH  If labs are in normal range and fatigue is persistent, recommend try to re-arrange sleep study.  Do not use My Chart to request refills or for acute issues that need immediate attention. If you send a my chart message, it may take a few days to be addressed, specially if I am not in the office.  Please be sure medication list is accurate. If a new problem present, please set up appointment sooner than planned today.

## 2023-09-01 NOTE — Assessment & Plan Note (Signed)
 She has lost some weight since her last visit, currently on Wegovy , which has been prescribed by her gynecologist for weight loss. Encouraged consistency with a healthy lifestyle for diabetes prevention. Last hemoglobin A1c 6.1 in 09/2022. Further recommendation will be given according to hemoglobin A1c result.

## 2023-09-01 NOTE — Assessment & Plan Note (Signed)
Following with psychiatry regularly. ?

## 2023-09-02 LAB — COMPREHENSIVE METABOLIC PANEL WITH GFR
ALT: 12 U/L (ref 0–35)
AST: 13 U/L (ref 0–37)
Albumin: 4.2 g/dL (ref 3.5–5.2)
Alkaline Phosphatase: 147 U/L — ABNORMAL HIGH (ref 39–117)
BUN: 7 mg/dL (ref 6–23)
CO2: 30 meq/L (ref 19–32)
Calcium: 9.5 mg/dL (ref 8.4–10.5)
Chloride: 102 meq/L (ref 96–112)
Creatinine, Ser: 0.7 mg/dL (ref 0.40–1.20)
GFR: 99.95 mL/min
Glucose, Bld: 85 mg/dL (ref 70–99)
Potassium: 3.9 meq/L (ref 3.5–5.1)
Sodium: 139 meq/L (ref 135–145)
Total Bilirubin: 0.3 mg/dL (ref 0.2–1.2)
Total Protein: 6.8 g/dL (ref 6.0–8.3)

## 2023-09-02 LAB — CBC WITH DIFFERENTIAL/PLATELET
Basophils Absolute: 0.1 10*3/uL (ref 0.0–0.1)
Basophils Relative: 1.3 % (ref 0.0–3.0)
Eosinophils Absolute: 0.3 10*3/uL (ref 0.0–0.7)
Eosinophils Relative: 2.5 % (ref 0.0–5.0)
HCT: 40 % (ref 36.0–46.0)
Hemoglobin: 12.9 g/dL (ref 12.0–15.0)
Lymphocytes Relative: 31.8 % (ref 12.0–46.0)
Lymphs Abs: 3.4 10*3/uL (ref 0.7–4.0)
MCHC: 32.3 g/dL (ref 30.0–36.0)
MCV: 86.9 fL (ref 78.0–100.0)
Monocytes Absolute: 0.6 10*3/uL (ref 0.1–1.0)
Monocytes Relative: 5.2 % (ref 3.0–12.0)
Neutro Abs: 6.4 10*3/uL (ref 1.4–7.7)
Neutrophils Relative %: 59.2 % (ref 43.0–77.0)
Platelets: 337 10*3/uL (ref 150.0–400.0)
RBC: 4.61 Mil/uL (ref 3.87–5.11)
RDW: 15.1 % (ref 11.5–15.5)
WBC: 10.8 10*3/uL — ABNORMAL HIGH (ref 4.0–10.5)

## 2023-09-02 LAB — HEMOGLOBIN A1C: Hgb A1c MFr Bld: 5.7 % (ref 4.6–6.5)

## 2023-09-02 LAB — HEPATIC FUNCTION PANEL
ALT: 12 U/L (ref 0–35)
AST: 13 U/L (ref 0–37)
Albumin: 4.2 g/dL (ref 3.5–5.2)
Alkaline Phosphatase: 147 U/L — ABNORMAL HIGH (ref 39–117)
Bilirubin, Direct: 0.1 mg/dL (ref 0.0–0.3)
Total Bilirubin: 0.3 mg/dL (ref 0.2–1.2)
Total Protein: 6.8 g/dL (ref 6.0–8.3)

## 2023-09-02 LAB — TSH: TSH: 2.28 u[IU]/mL (ref 0.35–5.50)

## 2023-09-02 LAB — VITAMIN D 25 HYDROXY (VIT D DEFICIENCY, FRACTURES): VITD: 19.11 ng/mL — ABNORMAL LOW (ref 30.00–100.00)

## 2023-09-02 LAB — GAMMA GT: GGT: 10 U/L (ref 7–51)

## 2023-09-02 LAB — VITAMIN B12: Vitamin B-12: 587 pg/mL (ref 211–911)

## 2023-09-03 DIAGNOSIS — E559 Vitamin D deficiency, unspecified: Secondary | ICD-10-CM | POA: Insufficient documentation

## 2023-09-03 MED ORDER — VITAMIN D (ERGOCALCIFEROL) 1.25 MG (50000 UNIT) PO CAPS: 50000.0000 [IU] | ORAL_CAPSULE | ORAL | 0 refills | Status: AC

## 2023-09-04 DIAGNOSIS — G4730 Sleep apnea, unspecified: Secondary | ICD-10-CM

## 2023-09-08 ENCOUNTER — Ambulatory Visit
Admission: RE | Admit: 2023-09-08 | Discharge: 2023-09-08 | Discharge status: Home / Self Care | Payer: BC Managed Care – PPO | Source: Ambulatory Visit | Attending: Family Medicine | Admitting: Family Medicine

## 2023-09-08 DIAGNOSIS — R748 Abnormal levels of other serum enzymes: Secondary | ICD-10-CM

## 2023-09-09 NOTE — Progress Notes (Signed)
HPI: [name] is a 53 y.o. female with a PMHx significant for HTN, hypothyroidism, depression, bipolar disorder, and GERD, who is here today for follow up on her recent liver ultrasound.  Last seen on 09/01/2023  RUQ [place] ordered due to persistently elevated alkaline phosphate.   Denies abdominal pain, nausea, vomiting, changes in bowel habits,jaundice, blood in stool or melena.      Latest Ref Rng & Units 09/01/2023    4:42 PM 10/03/2022    8:45 AM 10/25/2021    9:14 AM  Hepatic Function  Total Protein 6.0 - 8.3 g/dL 6.0 - 8.3 g/dL 6.8    6.8  7.5  6.9   Albumin 3.5 - 5.2 g/dL 3.5 - 5.2 g/dL 4.2    4.2  4.2  4.1   AST 0 - 37 U/L 0 - 37 U/L 13    13  13  10    ALT 0 - 35 U/L 0 - 35 U/L 12    12  16  11    Alk Phosphatase 39 - 117 U/L 39 - 117 U/L 147    147  167  146   Total Bilirubin 0.2 - 1.2 mg/dL 0.2 - 1.2 mg/dL 0.3    0.3  0.3  0.3   Bilirubin, Direct 0.0 - 0.3 mg/dL 0.1  0.1     US Abdomen limited RUQ impression:  Status post cholecystectomy.  Mild increased hepatic parenchymal echogenicity suggestive of steatosis.   Prediabetes: Negative for polydipsia,polyuria, or polyphagia.  Lab Results  Component Value Date   HGBA1C 5.7 09/01/2023   Lab Results  Component Value Date   CHOL 223 (H) 10/03/2022   HDL 73.40 10/03/2022   LDLCALC 134 (H) 10/03/2022   TRIG 77.0 10/03/2022   CHOLHDL 3 10/03/2022   She has been seeing a nutritionist once per month through her gynecologist's office. She is also on Semaglutide 0.25 mg weekly.  Review of Systems  Constitutional:  Negative for chills and fever.  HENT:  Negative for sore throat and trouble swallowing.   Respiratory:  Negative for shortness of breath.   Cardiovascular:  Negative for chest pain and leg swelling.  Skin:  Negative for rash.  Neurological:  Negative for syncope and weakness.  See other pertinent positives and negatives in HPI.  Current Outpatient Medications on File Prior to Visit   Medication Sig Dispense Refill   AZSTARYS 52.3-10.4 MG CAPS Take 1 capsule by mouth every morning.     cyanocobalamin (VITAMIN B12) 1000 MCG/ML injection INJECT 1 ML (1000 MCG) EVERY 4-5 WEEKS. 3 mL 3   Dexmethylphenidate HCl 35 MG CP24 Take 1 capsule by mouth every morning.     DULoxetine (CYMBALTA) 60 MG capsule Take 60 mg by mouth 2 (two) times daily.     levothyroxine (SYNTHROID) 25 MCG tablet Take 25 mcg by mouth daily.     LORazepam (ATIVAN) 1 MG tablet Take 1 mg by mouth every 6 (six) hours as needed for anxiety.     methylphenidate (RITALIN) 20 MG tablet Take 20 mg by mouth daily as needed.     Multiple Vitamin (MULTI-VITAMIN DAILY PO) Take by mouth.     OXcarbazepine (TRILEPTAL) 150 MG tablet Take 300 mg by mouth at bedtime.     pantoprazole (PROTONIX) 40 MG tablet Take 1 tablet (40 mg total) by mouth daily. 30 tablet 3   Semaglutide-Weight Management (WEGOVY) 0.25 MG/0.5ML SOAJ Inject 0.25 mg into the skin once a week. 2 mL 1  Syringe/Needle, Disp, (SYRINGE 3CC/25GX1") 25G X 1" 3 ML MISC To use with B12 100 each 2   trimethoprim-polymyxin b (POLYTRIM) ophthalmic solution Place 1 drop into both eyes every 4 (four) hours. 10 mL 0   Vitamin D, Ergocalciferol, (DRISDOL) 1.25 MG (50000 UNIT) CAPS capsule Take 1 capsule (50,000 Units total) by mouth every 7 (seven) days for 12 doses. 12 capsule 0   VRAYLAR capsule Take 3 mg by mouth at bedtime.     No current facility-administered medications on file prior to visit.    Past Medical History:  Diagnosis Date   Anxiety    Bipolar disorder (HCC)    Depression    History of kidney stones    Seizures (HCC) ~ 2009   "from taking Wellbutrin"   Symptomatic bradycardia    Allergies  Allergen Reactions   Ambien [Zolpidem Tartrate]     Seizure    Ciprofloxacin Anaphylaxis   Wellbutrin [Bupropion]     Seizure    Social History   Socioeconomic History   Marital status: Married    Spouse name: Not on file   Number of children: Not  on file   Years of education: Not on file   Highest education level: Master's degree (e.g., MA, MS, MEng, MEd, MSW, MBA)  Occupational History   Not on file  Tobacco Use   Smoking status: Former    Current packs/day: 0.00    Types: Cigarettes    Quit date: 10/15/2021    Years since quitting: 1.9   Smokeless tobacco: Never  Vaping Use   Vaping status: Never Used  Substance and Sexual Activity   Alcohol use: Yes    Comment: occ   Drug use: No   Sexual activity: Not on file  Other Topics Concern   Not on file  Social History Narrative   Not on file   Social Drivers of Health   Financial Resource Strain: Low Risk  (09/01/2023)   Overall Financial Resource Strain (CARDIA)    Difficulty of Paying Living Expenses: Not hard at all  Food Insecurity: No Food Insecurity (09/01/2023)   Hunger Vital Sign    Worried About Running Out of Food in the Last Year: Never true    Ran Out of Food in the Last Year: Never true  Transportation Needs: No Transportation Needs (09/01/2023)   PRAPARE - Administrator, Civil Service (Medical): No    Lack of Transportation (Non-Medical): No  Physical Activity: Unknown (09/01/2023)   Exercise Vital Sign    Days of Exercise per Week: 0 days    Minutes of Exercise per Session: Not on file  Stress: No Stress Concern Present (09/01/2023)   [employer] of Occupational Health - Occupational Stress Questionnaire    Feeling of Stress : Not at all  Social Connections: Socially Integrated (09/01/2023)   Social Connection and Isolation Panel [NHANES]    Frequency of Communication with Friends and Family: More than three times a week    Frequency of Social Gatherings with Friends and Family: More than three times a week    Attends Religious Services: More than 4 times per year    Active Member of Clubs or Organizations: Yes    Attends Banker Meetings: More than 4 times per year    Marital Status: Married    Vitals:   09/11/23 0804   BP: 124/70  Pulse: 88  Resp: 16  SpO2: 98%   Body mass index is 37.03 kg/m.  Physical  Exam Vitals and nursing note reviewed.  Constitutional:      General: She is not in acute distress.    Appearance: She is well-developed.  HENT:     Head: Normocephalic and atraumatic.  Eyes:     Conjunctiva/sclera: Conjunctivae normal.  Cardiovascular:     Rate and Rhythm: Normal rate and regular rhythm.     Heart sounds: No murmur heard. Pulmonary:     Effort: Pulmonary effort is normal. No respiratory distress.     Breath sounds: Normal breath sounds.  Abdominal:     Palpations: Abdomen is soft. There is no hepatomegaly or mass.     Tenderness: There is no abdominal tenderness.  Skin:    General: Skin is warm.     Findings: No erythema or rash.  Neurological:     General: No focal deficit present.     Mental Status: She is alert and oriented to person, place, and time.     Gait: Gait normal.  Psychiatric:        Mood and Affect: Mood and affect normal.   ASSESSMENT AND PLAN:  Ms. Maiorana was seen today to follow on her liver ultrasound.   Hepatic steatosis Assessment & Plan: We discussed Dx,prognosis,and available treatment options. Continue low fat diet and working on wt loss. Low fat diet. She is on wagovy 0.25 mg weekly for wt loss. We will continue following LFT's.   Prediabetes Assessment & Plan: Last HgA1C in 08/2023 improved, 5.7. Encouraged consistency with a healthy lifestyle for diabetes prevention.   Class 2 severe obesity with serious comorbidity and body mass index (BMI) of 37.0 to 37.9 in adult, unspecified obesity type [facility]) Assessment & Plan: Consistency with healthy diet and physical activity encouraged. On pharmacologic treatment, [id number]. Following with gynecologist and nutritionist.    Return if symptoms worsen or fail to improve, for keep next appointment.  I, Rolla Etienne Wierda, acting as a scribe for Jazminn Pomales [place], MD., have documented all  relevant documentation on the behalf of Poetry Cerro [place], MD, as directed by  Stoy Fenn [place], MD while in the presence of Berkeley Vanaken [place], MD.   I, Garron Eline [place], MD, have reviewed all documentation for this visit. The documentation on 09/11/23 for the exam, diagnosis, procedures, and orders are all accurate and complete.  Harlis Champoux G. [place], MD  [facility]. [place] office.

## 2023-09-11 ENCOUNTER — Ambulatory Visit (INDEPENDENT_AMBULATORY_CARE_PROVIDER_SITE_OTHER): Payer: BC Managed Care – PPO | Admitting: Family Medicine

## 2023-09-11 ENCOUNTER — Encounter: Payer: Self-pay | Admitting: Family Medicine

## 2023-09-11 VITALS — BP 124/70 | HR 88 | Resp 16 | Ht 65.0 in

## 2023-09-11 DIAGNOSIS — Z6837 Body mass index (BMI) 37.0-37.9, adult: Secondary | ICD-10-CM

## 2023-09-11 DIAGNOSIS — R7303 Prediabetes: Secondary | ICD-10-CM | POA: Diagnosis not present

## 2023-09-11 DIAGNOSIS — E66812 Obesity, class 2: Secondary | ICD-10-CM | POA: Diagnosis not present

## 2023-09-11 DIAGNOSIS — K760 Fatty (change of) liver, not elsewhere classified: Secondary | ICD-10-CM | POA: Insufficient documentation

## 2023-09-11 NOTE — Patient Instructions (Signed)
A few things to remember from today's visit:  Hepatic steatosis  Prediabetes  If you need refills for medications you take chronically, please call your pharmacy. Do not use My Chart to request refills or for acute issues that need immediate attention. If you send a my chart message, it may take a few days to be addressed, specially if I am not in the office.  Please be sure medication list is accurate. If a new problem present, please set up appointment sooner than planned today.

## 2023-09-11 NOTE — Assessment & Plan Note (Signed)
Consistency with healthy diet and physical activity encouraged. On pharmacologic treatment, [id number]. Following with gynecologist and nutritionist.

## 2023-09-11 NOTE — Assessment & Plan Note (Signed)
We discussed Dx,prognosis,and available treatment options. Continue low fat diet and working on wt loss. Low fat diet. She is on wagovy 0.25 mg weekly for wt loss. We will continue following LFT's.

## 2023-09-11 NOTE — Assessment & Plan Note (Signed)
Last HgA1C in 08/2023 improved, 5.7. Encouraged consistency with a healthy lifestyle for diabetes prevention.

## 2023-09-28 NOTE — Progress Notes (Signed)
ACUTE VISIT Chief Complaint  Patient presents with   Pain    Right side pain near liver that started on Sunday, did have an episode of chills, but no respiratory symptoms or further chills episodes.    HPI: [name] is a 53 y.o. female with a PMHx significant for HTN, hypothyroidism, depression, bipolar disorder, and GERD, who is here today with her wife complaining of right-sided abdominal pain.  She reports this is a new problem. She complains of a new intermittent right-sided abdominal pain right under her ribcage since 2/9.   Not radiated and stable.  She describes it as an achy pain and rates it as a 4/10.  She endorses some associated nausea and chills.  The pain is worsened by eating.  She has been taking ibuprofen for the pain.  Pertinent negatives include fever, chills, change in appetite, heartburn, numbness, tingling, or a skin rash. No history of trauma or unusual recent activities.   She is concerned about this being caused by fatty liver or other liver disease. Negative for nausea, skin pruritus, or jaundice.  Abdominal US from 09/08/2023 Impression:  Status post cholecystectomy Mild increased hepatic parenchymal echogenicity suggestive of steatosis.     Latest Ref Rng & Units 09/01/2023    4:42 PM 10/03/2022    8:45 AM 10/25/2021    9:14 AM  Hepatic Function  Total Protein 6.0 - 8.3 g/dL 6.0 - 8.3 g/dL 6.8    6.8  7.5  6.9   Albumin 3.5 - 5.2 g/dL 3.5 - 5.2 g/dL 4.2    4.2  4.2  4.1   AST 0 - 37 U/L 0 - 37 U/L 13    13  13  10    ALT 0 - 35 U/L 0 - 35 U/L 12    12  16  11    Alk Phosphatase 39 - 117 U/L 39 - 117 U/L 147    147  167  146   Total Bilirubin 0.2 - 1.2 mg/dL 0.2 - 1.2 mg/dL 0.3    0.3  0.3  0.3   Bilirubin, Direct 0.0 - 0.3 mg/dL 0.1  0.1     Review of Systems  Constitutional:  Negative for activity change, appetite change and unexpected weight change.  HENT:  Negative for sore throat and trouble swallowing.   Respiratory:   Negative for cough, shortness of breath and wheezing.   Gastrointestinal:  Negative for blood in stool and vomiting.  Genitourinary:  Negative for decreased urine volume, dysuria and hematuria.  Neurological:  Negative for syncope and weakness.  See other pertinent positives and negatives in HPI.  Current Outpatient Medications on File Prior to Visit  Medication Sig Dispense Refill   AZSTARYS 52.3-10.4 MG CAPS Take 1 capsule by mouth every morning.     cyanocobalamin (VITAMIN B12) 1000 MCG/ML injection INJECT 1 ML (1000 MCG) EVERY 4-5 WEEKS. 3 mL 3   Dexmethylphenidate HCl 35 MG CP24 Take 1 capsule by mouth every morning.     DULoxetine (CYMBALTA) 60 MG capsule Take 60 mg by mouth 2 (two) times daily.     levothyroxine (SYNTHROID) 25 MCG tablet Take 25 mcg by mouth daily.     LORazepam (ATIVAN) 1 MG tablet Take 1 mg by mouth every 6 (six) hours as needed for anxiety.     methylphenidate (RITALIN) 20 MG tablet Take 20 mg by mouth daily as needed.     Multiple Vitamin (MULTI-VITAMIN DAILY PO) Take by mouth.  OXcarbazepine (TRILEPTAL) 150 MG tablet Take 300 mg by mouth at bedtime.     pantoprazole (PROTONIX) 40 MG tablet Take 1 tablet (40 mg total) by mouth daily. 30 tablet 3   Semaglutide-Weight Management (WEGOVY) 0.25 MG/0.5ML SOAJ Inject 0.25 mg into the skin once a week. 2 mL 1   Syringe/Needle, Disp, (SYRINGE 3CC/25GX1") 25G X 1" 3 ML MISC To use with B12 100 each 2   trimethoprim-polymyxin b (POLYTRIM) ophthalmic solution Place 1 drop into both eyes every 4 (four) hours. 10 mL 0   Vitamin D, Ergocalciferol, (DRISDOL) 1.25 MG (50000 UNIT) CAPS capsule Take 1 capsule (50,000 Units total) by mouth every 7 (seven) days for 12 doses. 12 capsule 0   VRAYLAR capsule Take 3 mg by mouth at bedtime.     No current facility-administered medications on file prior to visit.    Past Medical History:  Diagnosis Date   Anxiety    Bipolar disorder (HCC)    Depression    History of kidney stones     Seizures (HCC) ~ 2009   "from taking Wellbutrin"   Symptomatic bradycardia    Allergies  Allergen Reactions   Ambien [Zolpidem Tartrate]     Seizure    Ciprofloxacin Anaphylaxis   Wellbutrin [Bupropion]     Seizure    Social History   Socioeconomic History   Marital status: Married    Spouse name: Not on file   Number of children: Not on file   Years of education: Not on file   Highest education level: Master's degree (e.g., MA, MS, MEng, MEd, MSW, MBA)  Occupational History   Not on file  Tobacco Use   Smoking status: Former    Current packs/day: 0.00    Types: Cigarettes    Quit date: 10/15/2021    Years since quitting: 1.9   Smokeless tobacco: Never  Vaping Use   Vaping status: Never Used  Substance and Sexual Activity   Alcohol use: Yes    Comment: occ   Drug use: No   Sexual activity: Not on file  Other Topics Concern   Not on file  Social History Narrative   Not on file   Social Drivers of Health   Financial Resource Strain: Low Risk  (09/01/2023)   Overall Financial Resource Strain (CARDIA)    Difficulty of Paying Living Expenses: Not hard at all  Food Insecurity: No Food Insecurity (09/01/2023)   Hunger Vital Sign    Worried About Running Out of Food in the Last Year: Never true    Ran Out of Food in the Last Year: Never true  Transportation Needs: No Transportation Needs (09/01/2023)   PRAPARE - Administrator, Civil Service (Medical): No    Lack of Transportation (Non-Medical): No  Physical Activity: Unknown (09/01/2023)   Exercise Vital Sign    Days of Exercise per Week: 0 days    Minutes of Exercise per Session: Not on file  Stress: No Stress Concern Present (09/01/2023)   [employer] of Occupational Health - Occupational Stress Questionnaire    Feeling of Stress : Not at all  Social Connections: Socially Integrated (09/01/2023)   Social Connection and Isolation Panel [NHANES]    Frequency of Communication with Friends and  Family: More than three times a week    Frequency of Social Gatherings with Friends and Family: More than three times a week    Attends Religious Services: More than 4 times per year    Active Member  of Clubs or Organizations: Yes    Attends Banker Meetings: More than 4 times per year    Marital Status: Married    Vitals:   09/29/23 0704  BP: 126/80  Pulse: 88  Resp: 12  SpO2: 97%   Body mass index is 37.13 kg/m.  Physical Exam Vitals and nursing note reviewed.  Constitutional:      General: She is not in acute distress.    Appearance: She is well-developed.  HENT:     Head: Normocephalic and atraumatic.     Mouth/Throat:     Mouth: Mucous membranes are dry.     Pharynx: Oropharynx is clear. Uvula midline.  Eyes:     Conjunctiva/sclera: Conjunctivae normal.  Cardiovascular:     Rate and Rhythm: Normal rate and regular rhythm.     Heart sounds: No murmur heard. Pulmonary:     Effort: Pulmonary effort is normal. No respiratory distress.     Breath sounds: Normal breath sounds.  Abdominal:     Palpations: Abdomen is soft. There is no hepatomegaly or mass.     Tenderness: There is no abdominal tenderness.  Lymphadenopathy:     Cervical: No cervical adenopathy.  Skin:    General: Skin is warm.     Findings: No erythema or rash.  Neurological:     General: No focal deficit present.     Mental Status: She is alert and oriented to person, place, and time.     Gait: Gait normal.  Psychiatric:        Mood and Affect: Affect normal. Mood is anxious.   ASSESSMENT AND PLAN:  Ms. Wineland was seen today for right-sided abdominal pain.   RUQ abdominal pain Points to area under right rib cage. We discussed possible etiologies. I do not think hepatic steatosis is a contributing factor. ?  Musculoskeletal and radicular pain to be considered. We discussed options at this time, including rib cage imaging, which she had in 07/2019 for 4 days of right rib cage.  She  prefers to hold on further workup for now. Continue monitoring for new symptoms, including rash.  Hepatic steatosis We discussed diagnosis, prognosis, and treatment options. For now recommend continuing low-fat diet. Last liver US done on 09/08/23, we could repeat in 1-2 years.  Elevated alkaline phosphatase level No other liver test abnormalities, it has been as hight as 167, last one 147. We reviewed possible etiologies. I do not think further testing is needed at this time, we will continue monitoring regularly.  She voices understanding and agrees with plan.  I spent a total of 30 minutes in both face to face and non face to face activities for this visit on the date of this encounter. During this time history was obtained and documented, examination was performed, prior labs/imaging reviewed, and assessment/plan discussed.  Return if symptoms worsen or fail to improve, for keep next appointment.  I, Rolla Etienne Wierda, acting as a scribe for Yosef Krogh [place], MD., have documented all relevant documentation on the behalf of Moishe Schellenberg [place], MD, as directed by  Alease Fait [place], MD while in the presence of Noya Santarelli [place], MD.   I, Nickalus Thornsberry [place], MD, have reviewed all documentation for this visit. The documentation on 09/29/23 for the exam, diagnosis, procedures, and orders are all accurate and complete.  Doryan Bahl G. [place], MD  [facility]. [place] office.

## 2023-09-29 ENCOUNTER — Ambulatory Visit (INDEPENDENT_AMBULATORY_CARE_PROVIDER_SITE_OTHER): Payer: BC Managed Care – PPO | Admitting: Family Medicine

## 2023-09-29 ENCOUNTER — Encounter: Payer: Self-pay | Admitting: Family Medicine

## 2023-09-29 VITALS — BP 126/80 | HR 88 | Resp 12 | Ht 65.0 in | Wt 223.1 lb

## 2023-09-29 DIAGNOSIS — R1011 Right upper quadrant pain: Secondary | ICD-10-CM | POA: Diagnosis not present

## 2023-09-29 DIAGNOSIS — R748 Abnormal levels of other serum enzymes: Secondary | ICD-10-CM | POA: Diagnosis not present

## 2023-09-29 DIAGNOSIS — K760 Fatty (change of) liver, not elsewhere classified: Secondary | ICD-10-CM

## 2023-09-29 NOTE — Patient Instructions (Addendum)
A few things to remember from today's visit:  RUQ abdominal pain  Hepatic steatosis  Elevated alkaline phosphatase level  Pain can be caused by muscular problems. I do not think it is liver pain. Monitor for new symptoms. Try topical icy hot for a few days at bedtime. Monitor for rash. If worse we can consider X ray and labs.  If you need refills for medications you take chronically, please call your pharmacy. Do not use My Chart to request refills or for acute issues that need immediate attention. If you send a my chart message, it may take a few days to be addressed, specially if I am not in the office.  Please be sure medication list is accurate. If a new problem present, please set up appointment sooner than planned today.

## 2023-10-12 ENCOUNTER — Telehealth: Payer: Self-pay | Admitting: Neurology

## 2023-10-12 ENCOUNTER — Institutional Professional Consult (permissible substitution): Payer: BC Managed Care – PPO | Admitting: Neurology

## 2023-10-12 ENCOUNTER — Encounter: Payer: Self-pay | Admitting: Neurology

## 2023-10-12 NOTE — Telephone Encounter (Signed)
 NS for sleep consult.

## 2023-11-19 ENCOUNTER — Other Ambulatory Visit: Payer: Self-pay | Admitting: Family Medicine

## 2023-11-19 DIAGNOSIS — E559 Vitamin D deficiency, unspecified: Secondary | ICD-10-CM

## 2023-12-11 ENCOUNTER — Other Ambulatory Visit (HOSPITAL_BASED_OUTPATIENT_CLINIC_OR_DEPARTMENT_OTHER): Payer: Self-pay

## 2023-12-24 LAB — HM MAMMOGRAPHY

## 2024-01-05 ENCOUNTER — Ambulatory Visit: Payer: Self-pay

## 2024-01-05 NOTE — Telephone Encounter (Signed)
 Copied from CRM [id number]. Topic: Clinical - Red Word Triage >> Jan 05, 2024 11:23 AM Lajean Pike wrote: Red Word that prompted transfer to Nurse Triage: Right side is burning, skin is extremely sore to the touch, spot on back is starting to burn as well, and chills.   Chief Complaint: Pain to skin of side and back  Symptoms: Pain to the skin on her right side by her waistline and to her right back  Frequency: Constant  Pertinent Negatives: Patient denies any redness or rash Disposition: [] ED /[] Urgent Care (no appt availability in office) / [x] Appointment(In office/virtual)/ []  [place] [facility]/ [] Home Care/ [] Refused Recommended Disposition /[] [facility]/ []  Follow-up with PCP Additional Notes: Patient calling stating she believes she may have shingles. She states that for the last 2-3 days she has been experiencing pain to her right side by her waistline and to a spot on her right sided back. She states that there is no visible rash but is concerned it may be shingles. Patient states she is also experiencing chills but denies any other symptoms at this time. Appointment made for tomorrow for evaluation and treatment of her symptoms.     Reason for Disposition  [1] Shingles rash (matches SYMPTOMS) AND [2] onset < 72 hours ago (3 days)    No rash, just pain  Answer Assessment - Initial Assessment Questions 1. APPEARANCE of RASH: "Describe the rash."      No rash 2. LOCATION: "Where is the rash located?"      Right sided back  3. ONSET: "When did the rash start?"      2-3 days ago  4. ITCHING: "Does the rash itch?" If Yes, ask: "How bad is the itch?"  (Scale 1-10; or mild, moderate, severe)     0/10 5. PAIN: "Does the rash hurt?" If Yes, ask: "How bad is the pain?"  (Scale 0-10; or none, mild, moderate, severe)    - NONE (0): no pain    - MILD (1-3): doesn't interfere with normal activities     - MODERATE (4-7): interferes with normal activities or awakens from sleep     -  SEVERE (8-10): excruciating pain, unable to do any normal activities     3/10 6. OTHER SYMPTOMS: "Do you have any other symptoms?" (e.g., fever)     Chills  Protocols used: Shingles (Zoster)-A-AH

## 2024-01-06 ENCOUNTER — Ambulatory Visit: Admitting: Family Medicine

## 2024-02-15 ENCOUNTER — Ambulatory Visit: Payer: Self-pay

## 2024-02-15 NOTE — Telephone Encounter (Signed)
 Noted

## 2024-02-15 NOTE — Telephone Encounter (Signed)
 FYI Only or Action Required?: FYI only for provider.  Patient was last seen in primary care on 09/29/2023 by [place], Betty G, MD. Called Nurse Triage reporting Oral Swelling. Symptoms began today. Interventions attempted: Nothing. Symptoms are: gradually worsening.  Triage Disposition: Go to ED or PCP/Alternative with Approval Referred to local UC or ED.   Patient/caregiver understands and will follow disposition?: Yes           Copied from CRM [id number]. Topic: Clinical - Pink Word Triage >> Feb 15, 2024  4:07 PM Powell HERO wrote: Reason for Triage: Upper lip has swollen, started on one side and is now swollen all the way across. No other symptoms, doesn't know of anything new she has ingested that could have caused it. States she feels totally fine. She states it looks like she got a lip injection. Only on her top lip. Reason for Disposition  [1] Severe swelling AND [2] cause unknown  Answer Assessment - Initial Assessment Questions 1. ONSET: When did the swelling start? (e.g., minutes, hours, days)     ---------------- This morning ( just upper lip)     2. SEVERITY: How swollen is it?     ------------ Severe- Entire upper lip. Hard to move the lip now.  Looks like I got a collagen injection into my entire upper lip ------ Reports it started on the left side of her lip, now has progressed to the right side.   3. ITCHING: Is there any itching? If Yes, ask: How much?   (Scale 1-10; mild, moderate or severe)     ---------------------------------- Denies    4. PAIN: Is the swelling painful to touch? If Yes, ask: How painful is it?   (Scale 1-10; mild, moderate or severe)     ---------------------------- Denies    5. CAUSE: What do you think is causing the lip swelling?     ------------------- Unsure    6. RECURRENT SYMPTOM: Have you had lip swelling before? If Yes, ask: When was the last time? What happened that time?     ------------------------ Denies     7. OTHER SYMPTOMS: Do you have any other symptoms? (e.g., toothache)    --Denies   Additional Information: Denies new medication and bug bites  Took Benadyl this morning- No relief  Protocols used: Lip Swelling-A-AH

## 2024-02-18 ENCOUNTER — Ambulatory Visit: Payer: Self-pay

## 2024-02-18 NOTE — Telephone Encounter (Signed)
 Patient has appt 7/8 with Dr. [place]

## 2024-02-18 NOTE — Telephone Encounter (Signed)
 FYI Only or Action Required?: Action required by provider: update on patient condition and if patient needs any additional medications.  Patient was last seen in primary care on 09/29/2023 by [place], Betty G, MD. Called Nurse Triage reporting Allergic Reaction. Symptoms began February 15, 2024. Interventions attempted: OTC medications: Benadryl, Prescription medications: Prednisone  (has three more days left) Epipen (used on 6/30)has one more pen available, and Rest, hydration, or home remedies. Symptoms are: Hives have improved with oral benadryl.  Triage Disposition: Home Care-patient given strict return precautions for ED.   Patient/caregiver understands and will follow disposition?: Yes  Copied from CRM [id number]. Topic: Clinical - Red Word Triage >> Feb 18, 2024 12:41 PM [place] E wrote: [facility] that prompted transfer to Nurse Triage: Allergic reaction. Patient woke up with welts on both of her arms, along with her eyes being swollen. Patient taking Prednisone , but welts are very itchy. Reason for Disposition  Widespread hives  Answer Assessment - Initial Assessment Questions 1. APPEARANCE: What does the rash look like?      Red welts 2. LOCATION: Where is the rash located?      On both arms 3. NUMBER: How many hives are there?      numerous 4. SIZE: How big are the hives? (inches, cm, compare to coins) Do they all look the same or is there lots of variation in shape and size?      Patient is having a hard time estimating 5. ONSET: When did the hives begin? (Hours or days ago)      Woke up this morning 6. ITCHING: Does it itch? If Yes, ask: How bad is the itch?    - MILD: doesn't interfere with normal activities   - MODERATE-SEVERE: interferes with work, school, sleep, or other activities      Mild-patient reports just having taking 1 Benadryl 7. RECURRENT PROBLEM: Have you had hives before? If Yes, ask: When was the last time? and What happened that time?       no 8. TRIGGERS: Were you exposed to any new food, plant, cosmetic product or animal just before the hives began?     Patient reports having a new kitten but has had him for three weeks.  9. OTHER SYMPTOMS: Do you have any other symptoms? (e.g., fever, tongue swelling, difficulty breathing, abdomen pain)     Lip swelling that occurred on 02/15/2024  Patient woke this morning with hives to both arms. Patient unsure of what is causing them. Denies shortness of breath, throat tightening, or chest pain. Patient reports taking Prednisone  and did take Benadryl. Reports benadryl has helped with the itching and the hives have decreased.Patient was seen on 02/15/2024 at Urgent Care for lip swelling of her upper lip. Patient reports improvement of after receiving IM shot of steroid. Patient reports swelling to her bottom lip on Monday night-was given an Epipen prescription and used it on Monday night. Patient reports no swelling currently.   Patient given instructions for home care for hives and given strict instructions for ED. Patient instructed on 24 hour antihistamine use along with Benadryl use. Patient does have an appointment with PCP on 7/8 and instructed to keep her appointment. Patient verbalized understanding and all questions answered.  Protocols used: Hives-A-AH

## 2024-02-22 ENCOUNTER — Other Ambulatory Visit: Payer: Self-pay | Admitting: Family Medicine

## 2024-02-22 DIAGNOSIS — H1033 Unspecified acute conjunctivitis, bilateral: Secondary | ICD-10-CM

## 2024-02-23 ENCOUNTER — Encounter: Payer: Self-pay | Admitting: Family Medicine

## 2024-02-23 ENCOUNTER — Ambulatory Visit (INDEPENDENT_AMBULATORY_CARE_PROVIDER_SITE_OTHER): Admitting: Family Medicine

## 2024-02-23 VITALS — BP 110/70 | HR 82 | Temp 97.9°F | Resp 16 | Ht 65.0 in | Wt 221.4 lb

## 2024-02-23 DIAGNOSIS — T783XXD Angioneurotic edema, subsequent encounter: Secondary | ICD-10-CM | POA: Diagnosis not present

## 2024-02-23 NOTE — Patient Instructions (Addendum)
 A few things to remember from today's visit:  Angioedema, subsequent encounter  Continue Zyrtec 10 mg daily for 30 days then wean it off. If another episode we can arrange appt with allergy specialist.  Do not use My Chart to request refills or for acute issues that need immediate attention. If you send a my chart message, it may take a few days to be addressed, specially if I am not in the office.  Please be sure medication list is accurate. If a new problem present, please set up appointment sooner than planned today.

## 2024-02-23 NOTE — Progress Notes (Signed)
 ACUTE VISIT Chief Complaint  Patient presents with   Follow-up    Urgent care visit 6/30 for lip swelling/allergic reaction    HPI: [name] is a 53 y.o. female with past medical history significant for hypertension, hypothyroidism, depression/bipolar disorder, hepatic asteatosis, and GERD who is here today with his wife complaining of episode of upper lip swelling with associated numbness, for which she was seen at Urgent Care for on 6/30.  At the time she was given a steroid injection and was prescribed Prednisone  and an Epi pen.  The following night, on 7/1, her bottom lip began swelling but resolved after administrating Epi.  She reports the next day she developed hives.  Her symptoms since then has fully resolved with no reoccurrences.  Currently taking Zyrtec once daily, in the mornings.  She did get a new cat about 3 weeks prior to onset of symptoms, but notes her cat stays indoors. She has also had cats in the past with no issues No new medications, unusual foods, perfumes, soaps, detergents, or other hygiene products.  Denies any fever, abnormal weight loss, dyspnea, cough, CP, nausea, vomiting, dizziness, new joint pain/swelling, or headaches.   She also reports hot flashes ongoing 3-4 months. She does not believe these episodes are related to her allergic reaction, however, she notes this has become more frequent recently.   Review of Systems  Constitutional:  Negative for activity change and appetite change.  HENT:  Negative for congestion, mouth sores and sore throat.   Respiratory:  Negative for chest tightness, wheezing and stridor.   Cardiovascular:  Negative for palpitations.  Gastrointestinal:  Negative for abdominal pain.       Changes in bowel habits.  Endocrine: Negative for cold intolerance and heat intolerance.  Genitourinary:  Negative for decreased urine volume, dysuria and hematuria.  Musculoskeletal:  Negative for myalgias.  Neurological:   Negative for syncope, facial asymmetry and weakness.  See other pertinent positives and negatives in HPI.  Current Outpatient Medications on File Prior to Visit  Medication Sig Dispense Refill   AZSTARYS 52.3-10.4 MG CAPS Take 1 capsule by mouth every morning.     cyanocobalamin  (VITAMIN B12) 1000 MCG/ML injection INJECT 1 ML (1000 MCG) EVERY 4-5 WEEKS. 3 mL 3   Dexmethylphenidate HCl 35 MG CP24 Take 1 capsule by mouth every morning.     DULoxetine  (CYMBALTA ) 60 MG capsule Take 60 mg by mouth 2 (two) times daily.     levothyroxine (SYNTHROID) 25 MCG tablet Take 25 mcg by mouth daily.     LORazepam  (ATIVAN ) 1 MG tablet Take 1 mg by mouth every 6 (six) hours as needed for anxiety.     methylphenidate  (RITALIN ) 20 MG tablet Take 20 mg by mouth daily as needed.     Multiple Vitamin (MULTI-VITAMIN DAILY PO) Take by mouth.     OXcarbazepine  (TRILEPTAL ) 150 MG tablet Take 300 mg by mouth at bedtime.     pantoprazole  (PROTONIX ) 40 MG tablet Take 1 tablet (40 mg total) by mouth daily. 30 tablet 3   Semaglutide -Weight Management (WEGOVY ) 0.25 MG/0.5ML SOAJ Inject 0.25 mg into the skin once a week. 2 mL 1   VRAYLAR capsule Take 3 mg by mouth at bedtime.     No current facility-administered medications on file prior to visit.    Past Medical History:  Diagnosis Date   Anxiety    Bipolar disorder (HCC)    Depression    History of kidney stones    Seizures (  HCC) ~ 2009   from taking Wellbutrin   Symptomatic bradycardia    Thyroid  disease    Allergies  Allergen Reactions   Ambien [Zolpidem Tartrate]     Seizure    Ciprofloxacin  Anaphylaxis   Wellbutrin [Bupropion]     Seizure    Social History   Socioeconomic History   Marital status: Married    Spouse name: Not on file   Number of children: Not on file   Years of education: Not on file   Highest education level: Master's degree (e.g., MA, MS, MEng, MEd, MSW, MBA)  Occupational History   Not on file  Tobacco Use   Smoking  status: Former    Current packs/day: 0.00    Types: Cigarettes    Quit date: 10/15/2021    Years since quitting: 2.3   Smokeless tobacco: Never  Vaping Use   Vaping status: Never Used  Substance and Sexual Activity   Alcohol use: Yes    Comment: occ   Drug use: No   Sexual activity: Not on file  Other Topics Concern   Not on file  Social History Narrative   Not on file   Social Drivers of Health   Financial Resource Strain: Low Risk  (09/01/2023)   Overall Financial Resource Strain (CARDIA)    Difficulty of Paying Living Expenses: Not hard at all  Food Insecurity: No Food Insecurity (09/01/2023)   Hunger Vital Sign    Worried About Running Out of Food in the Last Year: Never true    Ran Out of Food in the Last Year: Never true  Transportation Needs: No Transportation Needs (09/01/2023)   PRAPARE - Administrator, Civil Service (Medical): No    Lack of Transportation (Non-Medical): No  Physical Activity: Unknown (09/01/2023)   Exercise Vital Sign    Days of Exercise per Week: 0 days    Minutes of Exercise per Session: Not on file  Stress: No Stress Concern Present (09/01/2023)   [employer] of Occupational Health - Occupational Stress Questionnaire    Feeling of Stress : Not at all  Social Connections: Socially Integrated (09/01/2023)   Social Connection and Isolation Panel    Frequency of Communication with Friends and Family: More than three times a week    Frequency of Social Gatherings with Friends and Family: More than three times a week    Attends Religious Services: More than 4 times per year    Active Member of [employer] or Organizations: Yes    Attends Engineer, structural: More than 4 times per year    Marital Status: Married    Vitals:   02/23/24 0830  BP: 110/70  Pulse: 82  Resp: 16  Temp: 97.9 F (36.6 C)  SpO2: 99%   Body mass index is 36.84 kg/m.  Physical Exam Vitals and nursing note reviewed.  Constitutional:      General:  She is not in acute distress.    Appearance: She is well-developed.  HENT:     Head: Normocephalic and atraumatic.     Mouth/Throat:     Mouth: Mucous membranes are moist. No oral lesions or angioedema.     Pharynx: Oropharynx is clear. No pharyngeal swelling.  Eyes:     Conjunctiva/sclera: Conjunctivae normal.  Cardiovascular:     Rate and Rhythm: Normal rate and regular rhythm.     Heart sounds: No murmur heard. Pulmonary:     Effort: Pulmonary effort is normal. No respiratory distress.  Breath sounds: Normal breath sounds.  Abdominal:     Palpations: Abdomen is soft. There is no hepatomegaly, splenomegaly or mass.     Tenderness: There is no abdominal tenderness.  Lymphadenopathy:     Cervical: No cervical adenopathy.  Skin:    General: Skin is warm.     Findings: No erythema or rash.  Neurological:     General: No focal deficit present.     Mental Status: She is alert and oriented to person, place, and time.     Gait: Gait normal.  Psychiatric:        Mood and Affect: Mood and affect normal.    ASSESSMENT AND PLAN:  Ms. DESHAUNA CAYSON was seen today for lip swelling.  Angioedema, subsequent encounter  Problem has resolved, no prior episodes. We discussed possible etiologies. No new medications and her current ones do not have angioedema as a common side effect. We discussed options at this time, we decided to hold on immunology referral for now but will be considered if she has another episode. She has an EpiPen at home. Continue Zyrtec 10 mg daily for 30 days. Clearly instructed about warning signs.  Return if symptoms worsen or fail to improve, for keep next appointment.  I, Vernell Forest, acting as a scribe for Leeland Lovelady [place], MD., have documented all relevant documentation on the behalf of Lenore Moyano [place], MD, as directed by   while in the presence of Monserrath Junio [place], MD.  I, Nikolina Simerson [place], MD, have reviewed all documentation for this visit. The documentation on  02/23/24 for the exam, diagnosis, procedures, and orders are all accurate and complete.  Shaida Route G. [place], MD  [facility]. [place] office.

## 2024-02-29 ENCOUNTER — Other Ambulatory Visit (HOSPITAL_BASED_OUTPATIENT_CLINIC_OR_DEPARTMENT_OTHER): Payer: Self-pay

## 2024-03-18 ENCOUNTER — Encounter: Payer: Self-pay | Admitting: Family Medicine

## 2024-03-18 ENCOUNTER — Ambulatory Visit (INDEPENDENT_AMBULATORY_CARE_PROVIDER_SITE_OTHER): Admitting: Family Medicine

## 2024-03-18 VITALS — BP 120/70 | HR 82 | Temp 97.9°F | Resp 12 | Ht 65.0 in | Wt 213.1 lb

## 2024-03-18 DIAGNOSIS — Z23 Encounter for immunization: Secondary | ICD-10-CM

## 2024-03-18 DIAGNOSIS — E785 Hyperlipidemia, unspecified: Secondary | ICD-10-CM | POA: Diagnosis not present

## 2024-03-18 DIAGNOSIS — Z1329 Encounter for screening for other suspected endocrine disorder: Secondary | ICD-10-CM | POA: Diagnosis not present

## 2024-03-18 DIAGNOSIS — K760 Fatty (change of) liver, not elsewhere classified: Secondary | ICD-10-CM | POA: Diagnosis not present

## 2024-03-18 DIAGNOSIS — Z13228 Encounter for screening for other metabolic disorders: Secondary | ICD-10-CM | POA: Diagnosis not present

## 2024-03-18 DIAGNOSIS — Z130 Encounter for screening for diseases of the blood and blood-forming organs and certain disorders involving the immune mechanism: Secondary | ICD-10-CM

## 2024-03-18 DIAGNOSIS — Z0000 Encounter for general adult medical examination without abnormal findings: Secondary | ICD-10-CM | POA: Diagnosis not present

## 2024-03-18 DIAGNOSIS — F3341 Major depressive disorder, recurrent, in partial remission: Secondary | ICD-10-CM

## 2024-03-18 DIAGNOSIS — E559 Vitamin D deficiency, unspecified: Secondary | ICD-10-CM | POA: Diagnosis not present

## 2024-03-18 LAB — HEPATIC FUNCTION PANEL
ALT: 10 U/L (ref 0–35)
AST: 11 U/L (ref 0–37)
Albumin: 4.3 g/dL (ref 3.5–5.2)
Alkaline Phosphatase: 151 U/L — ABNORMAL HIGH (ref 39–117)
Bilirubin, Direct: 0.1 mg/dL (ref 0.0–0.3)
Total Bilirubin: 0.3 mg/dL (ref 0.2–1.2)
Total Protein: 7 g/dL (ref 6.0–8.3)

## 2024-03-18 LAB — HEMOGLOBIN A1C: Hgb A1c MFr Bld: 5.8 % (ref 4.6–6.5)

## 2024-03-18 LAB — VITAMIN D 25 HYDROXY (VIT D DEFICIENCY, FRACTURES): VITD: 34.19 ng/mL (ref 30.00–100.00)

## 2024-03-18 LAB — BASIC METABOLIC PANEL WITH GFR
BUN: 12 mg/dL (ref 6–23)
CO2: 29 meq/L (ref 19–32)
Calcium: 9.8 mg/dL (ref 8.4–10.5)
Chloride: 102 meq/L (ref 96–112)
Creatinine, Ser: 0.8 mg/dL (ref 0.40–1.20)
GFR: 84.83 mL/min (ref >60.00)
Glucose, Bld: 86 mg/dL (ref 70–99)
Potassium: 4.6 meq/L (ref 3.5–5.1)
Sodium: 139 meq/L (ref 135–145)

## 2024-03-18 LAB — LIPID PANEL
Cholesterol: 200 mg/dL (ref 0–200)
HDL: 56.8 mg/dL (ref >39.00)
LDL Cholesterol: 128 mg/dL — ABNORMAL HIGH (ref 0–99)
NonHDL: 143.05
Total CHOL/HDL Ratio: 4
Triglycerides: 77 mg/dL (ref 0.0–149.0)
VLDL: 15.4 mg/dL (ref 0.0–40.0)

## 2024-03-18 NOTE — Progress Notes (Signed)
 HPI: [name] is a 53 y.o. female with PMHx significant for HTN, HLD, prediabetes, hypothyroidism, depression/bipolar disorder, hepatic asteatosis, and GERD, who is here today with her wife for her routine physical.  Last CPE: 10/03/2022  Exercise: Tries to get 5,000 steps per day, tracking on a FitBit Diet: Home cooked meals 50% of the time, the other 50% is eating out/fast food. Not eating vegetables daily.  Sleep: 5 hours/night  Smoking: Former. Started in 2007 and quit in 2023, but reports quitting several times, smoking for a total of ~8 years 3/4 ppd (~15 cigarettes)  Alcohol consumption: Occasional  Dental: UTD with routine dental care.  Vision: UTD with routine vision exams  She follows with gynecologist regularly for her female preventive care, wt loss,and hypothyroidism. LMP: 04/2023  Chronic medical problems:   Hypertension:Not currently on pharmacologic treatment.   Lab Results  Component Value Date   CREATININE 0.70 09/01/2023   BUN 7 09/01/2023   NA 139 09/01/2023   K 3.9 09/01/2023   CL 102 09/01/2023   CO2 30 09/01/2023   Weight management She is following up with a weight loss program through gynecology once every 1-2 months. Currently on Wegovy  weekly. She is trying to work on [employer] prioritizing proteins and is even working with a Scientific laboratory technician.  Over the past 15 months she has lost about 40 lbs.  Lab Results  Component Value Date   HGBA1C 5.7 09/01/2023   HGBA1C 6.1 10/03/2022   HGBA1C 6.1 06/26/2021   Wt Readings from Last 5 Encounters:  03/18/24 213 lb 2 oz (96.7 kg)  02/23/24 221 lb 6 oz (100.4 kg)  09/29/23 223 lb 2 oz (101.2 kg)  09/01/23 222 lb 8 oz (100.9 kg)  06/11/23 234 lb 3.2 oz (106.2 kg)   Hyperlipidemia Not currently on pharmacologic treatment. Following a low fat diet: Eating out about 50% of the time. Lab Results  Component Value Date   CHOL 223 (H) 10/03/2022   HDL 73.40 10/03/2022   LDLCALC 134 (H)  10/03/2022   TRIG 77.0 10/03/2022   CHOLHDL 3 10/03/2022   Hypothyroidism Currently taking Synthroid 25 mcg once daily.   Lab Results  Component Value Date   TSH 2.28 09/01/2023   Depression/Bipolar disorder/anxiety Moods are overall stable.  Currently on Cymbalta  60 mg BID, trileptal  150 mg 2 tab daily,Vraylar 30 mg daily, Lorazepam  1 mg qid prn,and also on Ritalin  20 mg daily as needed.  Follows up with psychiatry twice yearly.   Vit D deficiency: Completed 12 weeks of Ergocalciferol  50,000 U weekly. It was recommended to continue OTC 2000 U daily.  Immunization History  Administered Date(s) Administered   Influenza Inj Mdck Quad Pf 09/09/2018   Influenza Nasal 06/01/2008   Influenza Split 09/09/2017   Influenza, Quadrivalent, Recombinant, Inj, Pf 05/19/2019   Influenza,inj,Quad PF,6+ Mos 08/17/2017, 04/29/2020   Influenza-Unspecified 05/19/2015, 05/18/2016, 08/17/2017, 05/22/2021, 05/18/2022   Moderna Covid-19 Vaccine Bivalent Booster 36yrs & up 05/22/2021   Moderna Sars-Covid-2 Vaccination 11/01/2019, 11/29/2019, 06/29/2020   Tdap 07/19/2016   Unspecified SARS-COV-2 Vaccination 05/18/2022   Zoster Recombinant(Shingrix) 06/12/2022   Health Maintenance  Topic Date Due   HIV Screening  Never done   Hepatitis B Vaccines (1 of 3 - 19+ 3-dose series) Never done   Pneumococcal Vaccine: 50+ Years (1 of 1 - PCV) Never done   Zoster Vaccines- Shingrix (2 of 2) 08/07/2022   Cervical Cancer Screening (HPV/Pap Cotest)  02/02/2023   COVID-19 Vaccine (6 - 2024-25 season) 04/19/2023  MAMMOGRAM  07/23/2023   INFLUENZA VACCINE  03/18/2024   DTaP/Tdap/Td (2 - Td or Tdap) 07/19/2026   Colonoscopy  01/23/2027   Hepatitis C Screening  Completed   HPV VACCINES  Aged Out   Meningococcal B Vaccine  Aged Out   She has no acute concerns today.  Review of Systems  Constitutional:  Negative for activity change, appetite change and fever.  HENT:  Negative for mouth sores, sore throat and  trouble swallowing.   Eyes:  Negative for redness and visual disturbance.  Respiratory:  Negative for cough, shortness of breath and wheezing.   Cardiovascular:  Negative for chest pain and leg swelling.  Gastrointestinal:  Negative for abdominal pain, nausea and vomiting.  Endocrine: Negative for cold intolerance, heat intolerance, polydipsia, polyphagia and polyuria.  Genitourinary:  Negative for decreased urine volume, dysuria and hematuria.  Musculoskeletal:  Negative for gait problem and myalgias.  Skin:  Negative for color change and rash.  Allergic/Immunologic: Negative for environmental allergies.  Neurological:  Negative for seizures, syncope, weakness and headaches.  Hematological:  Negative for adenopathy. Does not bruise/bleed easily.  Psychiatric/Behavioral:  Negative for confusion and hallucinations.   All other systems reviewed and are negative.  Current Outpatient Medications on File Prior to Visit  Medication Sig Dispense Refill   AZSTARYS 52.3-10.4 MG CAPS Take 1 capsule by mouth every morning.     cyanocobalamin  (VITAMIN B12) 1000 MCG/ML injection INJECT 1 ML (1000 MCG) EVERY 4-5 WEEKS. 3 mL 3   Dexmethylphenidate HCl 35 MG CP24 Take 1 capsule by mouth every morning.     DULoxetine  (CYMBALTA ) 60 MG capsule Take 60 mg by mouth 2 (two) times daily.     levothyroxine (SYNTHROID) 25 MCG tablet Take 25 mcg by mouth daily.     LORazepam  (ATIVAN ) 1 MG tablet Take 1 mg by mouth every 6 (six) hours as needed for anxiety.     methylphenidate  (RITALIN ) 20 MG tablet Take 20 mg by mouth daily as needed.     Multiple Vitamin (MULTI-VITAMIN DAILY PO) Take by mouth.     OXcarbazepine  (TRILEPTAL ) 150 MG tablet Take 300 mg by mouth at bedtime.     pantoprazole  (PROTONIX ) 40 MG tablet Take 1 tablet (40 mg total) by mouth daily. 30 tablet 3   Semaglutide -Weight Management (WEGOVY ) 0.25 MG/0.5ML SOAJ Inject 0.25 mg into the skin once a week. 2 mL 1   VRAYLAR capsule Take 3 mg by mouth at  bedtime.     No current facility-administered medications on file prior to visit.   Past Medical History:  Diagnosis Date   Anxiety    Bipolar disorder (HCC)    Depression    History of kidney stones    Seizures (HCC) ~ 2009   from taking Wellbutrin   Symptomatic bradycardia    Thyroid  disease    Past Surgical History:  Procedure Laterality Date   COLONOSCOPY  05/2001   /notes 06/04/2001   ESOPHAGOGASTRODUODENOSCOPY  11/2002   thelbert 12/05/2002   EYE SURGERY     LAPAROSCOPIC CHOLECYSTECTOMY  12/2002   LITHOTRIPSY  X 1   STRABISMUS SURGERY  1975; 1997   both eyes; right eye   TONSILLECTOMY  1980    Allergies  Allergen Reactions   Ambien [Zolpidem Tartrate]     Seizure    Ciprofloxacin  Anaphylaxis   Wellbutrin [Bupropion]     Seizure    No family history on file.  Social History   Socioeconomic History   Marital status: Married  Spouse name: Not on file   Number of children: Not on file   Years of education: Not on file   Highest education level: Master's degree (e.g., MA, MS, MEng, MEd, MSW, MBA)  Occupational History   Not on file  Tobacco Use   Smoking status: Former    Current packs/day: 0.00    Types: Cigarettes    Quit date: 10/15/2021    Years since quitting: 2.4   Smokeless tobacco: Never  Vaping Use   Vaping status: Never Used  Substance and Sexual Activity   Alcohol use: Yes    Comment: occ   Drug use: No   Sexual activity: Not on file  Other Topics Concern   Not on file  Social History Narrative   Not on file   Social Drivers of Health   Financial Resource Strain: Low Risk  (09/01/2023)   Overall Financial Resource Strain (CARDIA)    Difficulty of Paying Living Expenses: Not hard at all  Food Insecurity: No Food Insecurity (09/01/2023)   Hunger Vital Sign    Worried About Running Out of Food in the Last Year: Never true    Ran Out of Food in the Last Year: Never true  Transportation Needs: No Transportation Needs (09/01/2023)    PRAPARE - Administrator, Civil Service (Medical): No    Lack of Transportation (Non-Medical): No  Physical Activity: Unknown (09/01/2023)   Exercise Vital Sign    Days of Exercise per Week: 0 days    Minutes of Exercise per Session: Not on file  Stress: No Stress Concern Present (09/01/2023)   [employer] of Occupational Health - Occupational Stress Questionnaire    Feeling of Stress : Not at all  Social Connections: Socially Integrated (09/01/2023)   Social Connection and Isolation Panel    Frequency of Communication with Friends and Family: More than three times a week    Frequency of Social Gatherings with Friends and Family: More than three times a week    Attends Religious Services: More than 4 times per year    Active Member of [employer] or Organizations: Yes    Attends Engineer, structural: More than 4 times per year    Marital Status: Married   Today's Vitals   03/18/24 0845  BP: 120/70  Pulse: 82  Resp: 12  Temp: 97.9 F (36.6 C)  TempSrc: Oral  SpO2: 98%  Weight: 213 lb 2 oz (96.7 kg)  Height: 5' 5 (1.651 m)   Body mass index is 35.47 kg/m.  Wt Readings from Last 3 Encounters:  03/18/24 213 lb 2 oz (96.7 kg)  02/23/24 221 lb 6 oz (100.4 kg)  09/29/23 223 lb 2 oz (101.2 kg)   Physical Exam Vitals and nursing note reviewed.  Constitutional:      General: She is not in acute distress.    Appearance: She is well-developed.  HENT:     Head: Normocephalic and atraumatic.     Right Ear: Hearing, tympanic membrane, ear canal and external ear normal.     Left Ear: Hearing, tympanic membrane, ear canal and external ear normal.     Mouth/Throat:     Mouth: Mucous membranes are moist.     Pharynx: Oropharynx is clear. Uvula midline.  Eyes:     Extraocular Movements: Extraocular movements intact.     Conjunctiva/sclera: Conjunctivae normal.     Pupils: Pupils are equal, round, and reactive to light.  Neck:     Thyroid : No thyroid   mass or  thyromegaly.  Cardiovascular:     Rate and Rhythm: Normal rate and regular rhythm.     Pulses:          Dorsalis pedis pulses are 2+ on the right side and 2+ on the left side.     Heart sounds: No murmur heard. Pulmonary:     Effort: Pulmonary effort is normal. No respiratory distress.     Breath sounds: Normal breath sounds.  Abdominal:     Palpations: Abdomen is soft. There is no hepatomegaly or mass.     Tenderness: There is no abdominal tenderness.  Genitourinary:    Comments: Deferred to gyn. Musculoskeletal:     Comments: No major deformity or signs of synovitis appreciated.  Lymphadenopathy:     Cervical: No cervical adenopathy.     Upper Body:     Right upper body: No supraclavicular adenopathy.     Left upper body: No supraclavicular adenopathy.  Skin:    General: Skin is warm.     Findings: No erythema or rash.  Neurological:     General: No focal deficit present.     Mental Status: She is alert and oriented to person, place, and time.     Cranial Nerves: No cranial nerve deficit.     Coordination: Coordination normal.     Gait: Gait normal.     Deep Tendon Reflexes:     Reflex Scores:      Bicep reflexes are 2+ on the right side and 2+ on the left side.      Patellar reflexes are 2+ on the right side and 2+ on the left side. Psychiatric:        Mood and Affect: Mood and affect normal.   ASSESSMENT AND PLAN: [name] was here today annual physical examination.  Orders Placed This Encounter  Procedures   Pneumococcal conjugate vaccine 20-valent (Prevnar 20)   Basic metabolic panel with GFR   Hepatic function panel   Lipid panel   VITAMIN D  25 Hydroxy (Vit-D Deficiency, Fractures)   Hemoglobin A1c   Lab Results  Component Value Date   CHOL 200 03/18/2024   HDL 56.80 03/18/2024   LDLCALC 128 (H) 03/18/2024   TRIG 77.0 03/18/2024   CHOLHDL 4 03/18/2024   Lab Results  Component Value Date   NA 139 03/18/2024   CL 102 03/18/2024   K 4.6  03/18/2024   CO2 29 03/18/2024   BUN 12 03/18/2024   CREATININE 0.80 03/18/2024   GFR 84.83 03/18/2024   CALCIUM 9.8 03/18/2024   ALBUMIN 4.3 03/18/2024   GLUCOSE 86 03/18/2024   Lab Results  Component Value Date   ALT 10 03/18/2024   AST 11 03/18/2024   ALKPHOS 151 (H) 03/18/2024   BILITOT 0.3 03/18/2024   Lab Results  Component Value Date   HGBA1C 5.8 03/18/2024   Lab Results  Component Value Date   VD25OH 34.19 03/18/2024   Routine general medical examination at a health care facility Assessment & Plan: We discussed the importance of regular physical activity and healthy diet for prevention of chronic illness and/or complications. Preventive guidelines reviewed. Vaccination updated, Prevnar 20 given today. Ca++ and vit D supplementation recommended. Follows with gyne for her female preventive care , which is reported up to date. Next CPE in a year.  Hyperlipidemia, unspecified hyperlipidemia type Assessment & Plan: Continue non pharmacologic treatment. Further recommendations will be given according to 10 years CVD risk score and lipid panel numbers.  Orders: -     Lipid panel; Future  Screening for endocrine, metabolic and immunity disorder -     Basic metabolic panel with GFR; Future -     Hemoglobin A1c; Future  Vitamin D  deficiency, unspecified Assessment & Plan: Continue current dose of vit D supplementation. 25 OH vit D will be obtained today.  Orders: -     VITAMIN D  25 Hydroxy (Vit-D Deficiency, Fractures); Future  Hepatic steatosis Assessment & Plan: Alk phosphatase has been elevated, rest of liver test has been normal. Liver US  08/2023:Status post cholecystectomy. Mild increased hepatic parenchymal echogenicity suggestive of Steatosis. Will continue monitoring regularly.  Orders: -     Hepatic function panel; Future  Depression, major, recurrent, in partial remission (HCC) Follows with psychiatrist 2 times per year.  Need for pneumococcal  vaccination -     Pneumococcal conjugate vaccine 20-valent   Return in 1 year (on 03/18/2025) for CPE, Labs.  I, [name], acting as a scribe for Mary Karapetyan [place], MD., have documented all relevant documentation on the behalf of Mary Joslin [place], MD, as directed by   while in the presence of Mary Sinning [place], MD.  I, Mary Ozawa [place], MD, have reviewed all documentation for this visit. The documentation on 03/18/24 for the exam, diagnosis, procedures, and orders are all accurate and complete.  [name] G. [place], MD  [facility] office

## 2024-03-18 NOTE — Patient Instructions (Addendum)
 A few things to remember from today's visit:  Routine general medical examination at a health care facility  Hyperlipidemia, unspecified hyperlipidemia type - Plan: Lipid panel  Screening for endocrine, metabolic and immunity disorder - Plan: Basic metabolic panel with GFR, Hemoglobin A1c  Vitamin D  deficiency, unspecified - Plan: VITAMIN D  25 Hydroxy (Vit-D Deficiency, Fractures)  Hepatic steatosis - Plan: Hepatic function panel  Depression, major, recurrent, in partial remission (HCC)  Do not use My Chart to request refills or for acute issues that need immediate attention. If you send a my chart message, it may take a few days to be addressed, specially if I am not in the office.  Please be sure medication list is accurate. If a new problem present, please set up appointment sooner than planned today.  Health Maintenance, Female Adopting a healthy lifestyle and getting preventive care are important in promoting health and wellness. Ask your health care provider about: The right schedule for you to have regular tests and exams. Things you can do on your own to prevent diseases and keep yourself healthy. What should I know about diet, weight, and exercise? Eat a healthy diet  Eat a diet that includes plenty of vegetables, fruits, low-fat dairy products, and lean protein. Do not eat a lot of foods that are high in solid fats, added sugars, or sodium. Maintain a healthy weight Body mass index (BMI) is used to identify weight problems. It estimates body fat based on height and weight. Your health care provider can help determine your BMI and help you achieve or maintain a healthy weight. Get regular exercise Get regular exercise. This is one of the most important things you can do for your health. Most adults should: Exercise for at least 150 minutes each week. The exercise should increase your heart rate and make you sweat (moderate-intensity exercise). Do strengthening exercises at  least twice a week. This is in addition to the moderate-intensity exercise. Spend less time sitting. Even light physical activity can be beneficial. Watch cholesterol and blood lipids Have your blood tested for lipids and cholesterol at 52 years of age, then have this test every 5 years. Have your cholesterol levels checked more often if: Your lipid or cholesterol levels are high. You are older than 52 years of age. You are at high risk for heart disease. What should I know about cancer screening? Depending on your health history and family history, you may need to have cancer screening at various ages. This may include screening for: Breast cancer. Cervical cancer. Colorectal cancer. Skin cancer. Lung cancer. What should I know about heart disease, diabetes, and high blood pressure? Blood pressure and heart disease High blood pressure causes heart disease and increases the risk of stroke. This is more likely to develop in people who have high blood pressure readings or are overweight. Have your blood pressure checked: Every 3-5 years if you are 60-34 years of age. Every year if you are 1 years old or older. Diabetes Have regular diabetes screenings. This checks your fasting blood sugar level. Have the screening done: Once every three years after age 64 if you are at a normal weight and have a low risk for diabetes. More often and at a younger age if you are overweight or have a high risk for diabetes. What should I know about preventing infection? Hepatitis B If you have a higher risk for hepatitis B, you should be screened for this virus. Talk with your health care provider to find  out if you are at risk for hepatitis B infection. Hepatitis C Testing is recommended for: Everyone born from [birth year] through 1964. Anyone with known risk factors for hepatitis C. Sexually transmitted infections (STIs) Get screened for STIs, including gonorrhea and chlamydia, if: You are sexually active  and are younger than 52 years of age. You are older than 52 years of age and your health care provider tells you that you are at risk for this type of infection. Your sexual activity has changed since you were last screened, and you are at increased risk for chlamydia or gonorrhea. Ask your health care provider if you are at risk. Ask your health care provider about whether you are at high risk for HIV. Your health care provider may recommend a prescription medicine to help prevent HIV infection. If you choose to take medicine to prevent HIV, you should first get tested for HIV. You should then be tested every 3 months for as long as you are taking the medicine. Pregnancy If you are about to stop having your period (premenopausal) and you may become pregnant, seek counseling before you get pregnant. Take 400 to 800 micrograms (mcg) of folic acid every day if you become pregnant. Ask for birth control (contraception) if you want to prevent pregnancy. Osteoporosis and menopause Osteoporosis is a disease in which the bones lose minerals and strength with aging. This can result in bone fractures. If you are 91 years old or older, or if you are at risk for osteoporosis and fractures, ask your health care provider if you should: Be screened for bone loss. Take a calcium or vitamin D  supplement to lower your risk of fractures. Be given hormone replacement therapy (HRT) to treat symptoms of menopause. Follow these instructions at home: Alcohol use Do not drink alcohol if: Your health care provider tells you not to drink. You are pregnant, may be pregnant, or are planning to become pregnant. If you drink alcohol: Limit how much you have to: 0-1 drink a day. Know how much alcohol is in your drink. In the [place] one drink equals one 12 oz bottle of beer (355 mL), one 5 oz glass of wine (148 mL), or one 1 oz glass of hard liquor (44 mL). Lifestyle Do not use any products that contain nicotine or tobacco.  These products include cigarettes, chewing tobacco, and vaping devices, such as e-cigarettes. If you need help quitting, ask your health care provider. Do not use street drugs. Do not share needles. Ask your health care provider for help if you need support or information about quitting drugs. General instructions Schedule regular health, dental, and eye exams. Stay current with your vaccines. Tell your health care provider if: You often feel depressed. You have ever been abused or do not feel safe at home. Summary Adopting a healthy lifestyle and getting preventive care are important in promoting health and wellness. Follow your health care provider's instructions about healthy diet, exercising, and getting tested or screened for diseases. Follow your health care provider's instructions on monitoring your cholesterol and blood pressure. This information is not intended to replace advice given to you by your health care provider. Make sure you discuss any questions you have with your health care provider. Document Revised: 12/24/2020 Document Reviewed: 12/24/2020 Elsevier Patient Education  2024 [employer].

## 2024-03-19 ENCOUNTER — Ambulatory Visit: Payer: Self-pay | Admitting: Family Medicine

## 2024-03-19 NOTE — Assessment & Plan Note (Signed)
 Alk phosphatase has been elevated, rest of liver test has been normal. Liver US  08/2023:Status post cholecystectomy. Mild increased hepatic parenchymal echogenicity suggestive of Steatosis. Will continue monitoring regularly.

## 2024-03-19 NOTE — Assessment & Plan Note (Signed)
 Continue nonpharmacologic treatment. Further recommendations will be given according to 10 years CVD risk score and lipid panel numbers.

## 2024-03-19 NOTE — Assessment & Plan Note (Signed)
 We discussed the importance of regular physical activity and healthy diet for prevention of chronic illness and/or complications. Preventive guidelines reviewed. Vaccination updated, Prevnar 20 given today. Ca++ and vit D supplementation recommended. Follows with gyne for her female preventive care , which is reported up to date. Next CPE in a year.

## 2024-03-19 NOTE — Assessment & Plan Note (Signed)
 Continue current dose of vit D supplementation. 25 OH vit D will be obtained today.

## 2024-03-25 ENCOUNTER — Ambulatory Visit: Payer: Self-pay

## 2024-03-25 NOTE — Telephone Encounter (Signed)
 FYI Only or Action Required?: Action required by provider: clinical question for provider.  Patient asking if there are any supplements or medications she can try to help with the hot flashes  Patient was last seen in primary care on 03/18/2024 by [place], Betty G, MD.  Called Nurse Triage reporting Hot Flashes.  Symptoms began several weeks ago.  Interventions attempted: Nothing.  Symptoms are: gradually worsening.  Triage Disposition: Home Care  Patient/caregiver understands and will follow disposition?: Yes Copied from CRM #[id number]. Topic: Clinical - Medical Advice >> Mar 25, 2024  1:10 PM Robinson H wrote: Reason for CRM: Patient states she's having very bad hot flashes, wants to know if there is something she can take or do that can help.  Evelyn [phone] Reason for Disposition  Normal menopause symptoms (e.g., hot flashes, irregular periods, vaginal dryness)  Answer Assessment - Initial Assessment Questions 1. MAIN SYMPTOM: What is your main symptom? How bad is it?  (e.g., difficulty concentrating, hot flashes, fatigue, irregular periods, vaginal dryness)     Hot flashes  2. ONSET: When did the Hot Flashes begin? (e.g., hours, days or weeks ago)     Has been going on for 7-8 months but they are happening for frequent  3. MENOPAUSE: When was your last menstrual period?     Last menstrual last September   4. MEDICINES: Are you taking any hormone prescription or over-the-counter medicines? (e.g., estrogen; estrogen/progesterone, herbal supplements, medicines for mood, new medicines, Niacin)     No  5. OTHER SYMPTOMS: Do you have any other symptoms? (e.g., insomnia, mood swings, palpitations)     No- takes a mood stabilizer  6. CALLER'S COPING: How are you doing? (e.g., anxiety, depression, emotional state with menopause symptoms)     Okay, just frustrating  7. TREATMENTS AND RESPONSE: What have you done so far to try to make this better?     No  8.  PREGNANCY: Is there any chance you are pregnant? When was your last menstrual period?     No  Protocols used: Menopause Symptoms and Questions-A-AH

## 2024-04-22 ENCOUNTER — Other Ambulatory Visit (HOSPITAL_BASED_OUTPATIENT_CLINIC_OR_DEPARTMENT_OTHER): Payer: Self-pay

## 2024-08-16 ENCOUNTER — Other Ambulatory Visit (HOSPITAL_BASED_OUTPATIENT_CLINIC_OR_DEPARTMENT_OTHER): Payer: Self-pay

## 2024-08-30 ENCOUNTER — Ambulatory Visit: Payer: Self-pay

## 2024-08-30 NOTE — Telephone Encounter (Signed)
" °  FYI Only or Action Required?: FYI only for provider: appointment scheduled on 08/31/2024.  Patient was last seen in primary care on 03/18/2024 by Jordan, Betty G, MD.  Called Nurse Triage reporting Fatigue.  Symptoms began several months ago.  Interventions attempted: Rest, hydration, or home remedies.  Symptoms are: unchanged.  Triage Disposition: See PCP Within 2 Weeks  Patient/caregiver understands and will follow disposition?: Yes  Copied from CRM [id number]. Topic: Clinical - Red Word Triage >> Aug 30, 2024 10:09 AM [name] wrote: Red Word that prompted transfer to Nurse Triage: Fatigue, possibly a vitamin difficiently. Pt feels like heart flutters a bit. Transferred to Nurse. Reason for Disposition  Weakness is a chronic symptom (recurrent or ongoing AND present > 4 weeks)  Answer Assessment - Initial Assessment Questions 1. DESCRIPTION: Describe how you are feeling.     Never feels rested, always tired 2. SEVERITY: How bad is it?  Can you stand and walk?     Yes, performs daily activities 3. ONSET: When did these symptoms begin? (e.g., hours, days, weeks, months)     A couple of months ago 4. CAUSE: What do you think is causing the weakness or fatigue? (e.g., not drinking enough fluids, medical problem, trouble sleeping)     unknown 5. NEW MEDICINES:  Have you started on any new medicines recently? (e.g., opioid pain medicines, benzodiazepines, muscle relaxants, antidepressants, antihistamines, neuroleptics, beta blockers)     HRT began 04/2024 6. OTHER SYMPTOMS: Do you have any other symptoms? (e.g., chest pain, fever, cough, SOB, vomiting, diarrhea, bleeding, other areas of pain)     Vaginal bleeding x 11 days in December, last period before this was 04/2023 7. PREGNANCY: Is there any chance you are pregnant? When was your last menstrual period?     Denies, in same sex relationship  Protocols used: Weakness (Generalized) and Fatigue-A-AH  "

## 2024-08-31 ENCOUNTER — Ambulatory Visit: Payer: Self-pay | Admitting: Family Medicine

## 2024-08-31 ENCOUNTER — Ambulatory Visit (INDEPENDENT_AMBULATORY_CARE_PROVIDER_SITE_OTHER): Admitting: Family Medicine

## 2024-08-31 ENCOUNTER — Encounter: Payer: Self-pay | Admitting: Family Medicine

## 2024-08-31 VITALS — BP 118/78 | HR 75 | Temp 97.7°F | Resp 16 | Ht 65.0 in | Wt 196.6 lb

## 2024-08-31 DIAGNOSIS — R748 Abnormal levels of other serum enzymes: Secondary | ICD-10-CM | POA: Diagnosis not present

## 2024-08-31 DIAGNOSIS — E538 Deficiency of other specified B group vitamins: Secondary | ICD-10-CM

## 2024-08-31 DIAGNOSIS — G4719 Other hypersomnia: Secondary | ICD-10-CM | POA: Diagnosis not present

## 2024-08-31 DIAGNOSIS — R5383 Other fatigue: Secondary | ICD-10-CM

## 2024-08-31 LAB — BASIC METABOLIC PANEL WITH GFR
BUN: 10 mg/dL (ref 6–23)
CO2: 29 meq/L (ref 19–32)
Calcium: 9 mg/dL (ref 8.4–10.5)
Chloride: 105 meq/L (ref 96–112)
Creatinine, Ser: 0.71 mg/dL (ref 0.40–1.20)
GFR: 97.58 mL/min
Glucose, Bld: 78 mg/dL (ref 70–99)
Potassium: 3.4 meq/L — ABNORMAL LOW (ref 3.5–5.1)
Sodium: 140 meq/L (ref 135–145)

## 2024-08-31 LAB — CBC WITH DIFFERENTIAL/PLATELET
Basophils Absolute: 0.1 K/uL (ref 0.0–0.1)
Basophils Relative: 0.6 % (ref 0.0–3.0)
Eosinophils Absolute: 0.5 K/uL (ref 0.0–0.7)
Eosinophils Relative: 4.9 % (ref 0.0–5.0)
HCT: 38.1 % (ref 36.0–46.0)
Hemoglobin: 13 g/dL (ref 12.0–15.0)
Lymphocytes Relative: 40.9 % (ref 12.0–46.0)
Lymphs Abs: 3.8 K/uL (ref 0.7–4.0)
MCHC: 34.1 g/dL (ref 30.0–36.0)
MCV: 87.4 fl (ref 78.0–100.0)
Monocytes Absolute: 0.6 K/uL (ref 0.1–1.0)
Monocytes Relative: 5.9 % (ref 3.0–12.0)
Neutro Abs: 4.4 K/uL (ref 1.4–7.7)
Neutrophils Relative %: 47.7 % (ref 43.0–77.0)
Platelets: 310 K/uL (ref 150.0–400.0)
RBC: 4.36 Mil/uL (ref 3.87–5.11)
RDW: 13.2 % (ref 11.5–15.5)
WBC: 9.3 K/uL (ref 4.0–10.5)

## 2024-08-31 LAB — C-REACTIVE PROTEIN: CRP: <0.5 mg/dL — ABNORMAL LOW (ref 1.0–20.0)

## 2024-08-31 LAB — HEPATIC FUNCTION PANEL
ALT: 10 U/L (ref 3–35)
AST: 10 U/L (ref 5–37)
Albumin: 4.1 g/dL (ref 3.5–5.2)
Alkaline Phosphatase: 102 U/L (ref 39–117)
Bilirubin, Direct: 0.1 mg/dL (ref 0.1–0.3)
Total Bilirubin: 0.2 mg/dL (ref 0.2–1.2)
Total Protein: 6.9 g/dL (ref 6.0–8.3)

## 2024-08-31 LAB — VITAMIN B12: Vitamin B-12: 512 pg/mL (ref 211–911)

## 2024-08-31 NOTE — Assessment & Plan Note (Signed)
 Mild, rest of liver numbers have been normal. Last alkaline phosphatase 151 in 03/2024. Liver US  showed hepatic steatosis. Currently she is on semaglutide  for weight loss. Continue avoiding alcohol, juices, sodas, and refined carbs.

## 2024-08-31 NOTE — Patient Instructions (Addendum)
 A few things to remember from today's visit:  B12 deficiency - Plan: Vitamin B12  Fatigue, unspecified type - Plan: Ambulatory referral to Sleep Studies, Basic metabolic panel with GFR, C-reactive protein, CBC with Differential/Platelet  Daytime hypersomnolence - Plan: Ambulatory referral to Sleep Studies  Alkaline phosphatase elevation - Plan: Hepatic function panel  If you need refills for medications you take chronically, please call your pharmacy. Do not use My Chart to request refills or for acute issues that need immediate attention. If you send a my chart message, it may take a few days to be addressed, specially if I am not in the office.  Please be sure medication list is accurate. If a new problem present, please set up appointment sooner than planned today.

## 2024-08-31 NOTE — Progress Notes (Signed)
 "  ACUTE VISIT Chief Complaint  Patient presents with   Fatigue    Patient states she been extremely tired x 3 months - worsening - Patient stated waking up and feeling like she hasn't slept even though she slept well the night before.   Discussed the use of AI scribe software for clinical note transcription with the patient, who gave verbal consent to proceed.  History of Present Illness Ms.THARON KITCH is a 54 y.o. female with PMHx significant for HTN, HLD, prediabetes, hypothyroidism, depression/bipolar disorder, hepatic asteatosis, and GERD, who is here today complaining of fatigue as described above.  She experiences significant fatigue, describing a lack of energy and feeling very tired. She sleeps well, averaging six to seven hours per night, and feels rested upon waking. However, a couple of hours into her day, particularly at work, she feels extremely tired and struggles to stay awake, though she does not fall asleep while driving.  She denies fever, chills, abnormal weight loss (she is on a GLP-1 agonist), cough, difficulty breathing, wheezing, abdominal pain, or changes in bowel habits.  She reports occasional heart flutters, which have been evaluated by a cardiologist, and she recalls being told it was not caused by a serious arrhythmia. Problem is unchanged. No new stressors or medications, except for hormone replacement therapy for hot flashes prescribed by her gynecologist.  Her current medications include Ritalin  for ADHD, Trileptal  for bipolar disorder, and monthly B12 injections. She also takes  vitamin D  50,000 units once a week. Hypothyroidism: Currently she is on levothyroxine 25 mcg daily.  Her TSH in 06/2024 was in normal range.  She denies any new or worsening symptoms related to her depression, which she feels is well-controlled.  She does not engage in regular exercise but reports eating healthily.   Lab Results  Component Value Date   VITAMINB12 587  09/01/2023   Lab Results  Component Value Date   TSH 2.28 09/01/2023   Lab Results  Component Value Date   NA 139 03/18/2024   CL 102 03/18/2024   K 4.6 03/18/2024   CO2 29 03/18/2024   BUN 12 03/18/2024   CREATININE 0.80 03/18/2024   GFR 84.83 03/18/2024   CALCIUM 9.8 03/18/2024   ALBUMIN 4.3 03/18/2024   GLUCOSE 86 03/18/2024   Lab Results  Component Value Date   ALT 10 03/18/2024   AST 11 03/18/2024   ALKPHOS 151 (H) 03/18/2024   BILITOT 0.3 03/18/2024   Lab Results  Component Value Date   VD25OH 34.19 03/18/2024   Review of Systems  Constitutional:  Negative for appetite change.  HENT:  Negative for mouth sores, sore throat and trouble swallowing.   Cardiovascular:  Positive for palpitations.  Gastrointestinal:  Negative for nausea and vomiting.  Endocrine: Negative for cold intolerance and heat intolerance.  Genitourinary:  Negative for decreased urine volume, dysuria and hematuria.  Musculoskeletal:  Negative for joint swelling and myalgias.  Skin:  Negative for rash.  Neurological:  Negative for syncope, facial asymmetry and weakness.  See other pertinent positives and negatives in HPI.  Medications Ordered Prior to Encounter[1]  Past Medical History:  Diagnosis Date   Anxiety    Bipolar disorder (HCC)    Depression    History of kidney stones    Seizures (HCC) ~ 2009   from taking Wellbutrin   Symptomatic bradycardia    Thyroid  disease    Allergies[2]  Social History   Socioeconomic History   Marital status: Married  Spouse name: Not on file   Number of children: Not on file   Years of education: Not on file   Highest education level: Master's degree (e.g., MA, MS, MEng, MEd, MSW, MBA)  Occupational History   Not on file  Tobacco Use   Smoking status: Former    Current packs/day: 0.00    Types: Cigarettes    Quit date: 10/15/2021    Years since quitting: 2.8   Smokeless tobacco: Never  Vaping Use   Vaping status: Never Used   Substance and Sexual Activity   Alcohol use: Yes    Comment: occ   Drug use: No   Sexual activity: Not on file  Other Topics Concern   Not on file  Social History Narrative   Not on file   Social Drivers of Health   Tobacco Use: Medium Risk (08/31/2024)   Patient History    Smoking Tobacco Use: Former    Smokeless Tobacco Use: Never    Passive Exposure: Not on file  Financial Resource Strain: Low Risk (09/01/2023)   Overall Financial Resource Strain (CARDIA)    Difficulty of Paying Living Expenses: Not hard at all  Food Insecurity: No Food Insecurity (09/01/2023)   Hunger Vital Sign    Worried About Running Out of Food in the Last Year: Never true    Ran Out of Food in the Last Year: Never true  Transportation Needs: No Transportation Needs (09/01/2023)   PRAPARE - Administrator, Civil Service (Medical): No    Lack of Transportation (Non-Medical): No  Physical Activity: Unknown (09/01/2023)   Exercise Vital Sign    Days of Exercise per Week: 0 days    Minutes of Exercise per Session: Not on file  Stress: No Stress Concern Present (09/01/2023)   [employer] of Occupational Health - Occupational Stress Questionnaire    Feeling of Stress : Not at all  Social Connections: Socially Integrated (09/01/2023)   Social Connection and Isolation Panel    Frequency of Communication with Friends and Family: More than three times a week    Frequency of Social Gatherings with Friends and Family: More than three times a week    Attends Religious Services: More than 4 times per year    Active Member of Clubs or Organizations: Yes    Attends Banker Meetings: More than 4 times per year    Marital Status: Married  Depression (PHQ2-9): Low Risk (08/31/2024)   Depression (PHQ2-9)    PHQ-2 Score: 3  Alcohol Screen: Low Risk (09/01/2023)   Alcohol Screen    Last Alcohol Screening Score (AUDIT): 1  Housing: High Risk (09/01/2023)   Housing Stability Vital Sign     Unable to Pay for Housing in the Last Year: Yes    Number of Times Moved in the Last Year: 0    Homeless in the Last Year: No  Utilities: Not on file  Health Literacy: Not on file    Vitals:   08/31/24 0722  BP: 118/78  Pulse: 75  Resp: 16  Temp: 97.7 F (36.5 C)  SpO2: 99%   Body mass index is 32.72 kg/m.  Physical Exam Vitals and nursing note reviewed.  Constitutional:      General: She is not in acute distress.    Appearance: She is well-developed.  HENT:     Head: Normocephalic and atraumatic.     Mouth/Throat:     Mouth: Mucous membranes are dry.     Pharynx: Oropharynx  is clear.  Eyes:     Conjunctiva/sclera: Conjunctivae normal.  Cardiovascular:     Rate and Rhythm: Normal rate and regular rhythm. Occasional Extrasystoles (X 1 noted on auscultation.) are present.    Heart sounds: No murmur heard. Pulmonary:     Effort: Pulmonary effort is normal. No respiratory distress.     Breath sounds: Normal breath sounds.  Abdominal:     Palpations: Abdomen is soft. There is no hepatomegaly or mass.     Tenderness: There is no abdominal tenderness.  Lymphadenopathy:     Cervical: No cervical adenopathy.  Skin:    General: Skin is warm.     Findings: No erythema or rash.  Neurological:     General: No focal deficit present.     Mental Status: She is alert and oriented to person, place, and time.     Gait: Gait normal.  Psychiatric:        Mood and Affect: Mood and affect normal.    ASSESSMENT AND PLAN:  Ms. Mozella Rexrode was seen today for fatigue.  Diagnoses and all orders for this visit:  Orders Placed This Encounter  Procedures   Basic metabolic panel with GFR   C-reactive protein   CBC with Differential/Platelet   Vitamin B12   Hepatic function panel   Ambulatory referral to Sleep Studies   Lab Results  Component Value Date   NA 140 08/31/2024   CL 105 08/31/2024   K 3.4 (L) 08/31/2024   CO2 29 08/31/2024   BUN 10 08/31/2024   CREATININE 0.71  08/31/2024   GFR 97.58 08/31/2024   CALCIUM 9.0 08/31/2024   ALBUMIN 4.1 08/31/2024   GLUCOSE 78 08/31/2024   Lab Results  Component Value Date   CRP <0.5 (L) 08/31/2024   Lab Results  Component Value Date   WBC 9.3 08/31/2024   HGB 13.0 08/31/2024   HCT 38.1 08/31/2024   MCV 87.4 08/31/2024   PLT 310.0 08/31/2024   Lab Results  Component Value Date   VITAMINB12 512 08/31/2024   Lab Results  Component Value Date   ALT 10 08/31/2024   AST 10 08/31/2024   ALKPHOS 102 08/31/2024   BILITOT 0.2 08/31/2024   Fatigue, unspecified type We discussed possible etiologies. Explained that most of the time the specific cause cannot be identified, usually benign. Some of her medications and chronic comorbidities can be contributing factors. Sleep apnea is to be considered, referral placed. We discussed this problem in 08/2023. Hypothyroidism has been adequately controlled with levothyroxine 25 mcg daily, last TSH was 1.6 in 06/2024. Some labs ordered today.  -     Ambulatory referral to Sleep Studies -     Basic metabolic panel with GFR; Future -     C-reactive protein; Future -     CBC with Differential/Platelet; Future  B12 deficiency Assessment & Plan: Last B12 in 08/2023 was normal at 587. Continue B12 supplementation IM 1000 mcg monthly. Further recommendation will be given according to B12 result.  Orders: -     Vitamin B12; Future  Daytime hypersomnolence -     Ambulatory referral to Sleep Studies  Alkaline phosphatase elevation Assessment & Plan: Mild, rest of liver numbers have been normal. Last alkaline phosphatase 151 in 03/2024. Liver US  showed hepatic steatosis. Currently she is on semaglutide  for weight loss. Continue avoiding alcohol, juices, sodas, and refined carbs.  Orders: -     Hepatic function panel; Future  Return if symptoms worsen or fail to improve,  for keep next appointment.  Kendre Jacinto G. Synai Prettyman, MD  [facility]. [place]  office.     [1]  Current Outpatient Medications on File Prior to Visit  Medication Sig Dispense Refill   Amphet-Dextroamphet 3-Bead ER (MYDAYIS) 25 MG CP24 Take 1 capsule by mouth daily.     cyanocobalamin  (VITAMIN B12) 1000 MCG/ML injection INJECT 1 ML (1000 MCG) EVERY 4-5 WEEKS. 3 mL 3   Dexmethylphenidate HCl 35 MG CP24 Take 1 capsule by mouth every morning.     DULoxetine  (CYMBALTA ) 60 MG capsule Take 60 mg by mouth 2 (two) times daily.     estradiol (VIVELLE-DOT) 0.05 MG/24HR patch Place 1 patch onto the skin 2 (two) times a week.     levothyroxine (SYNTHROID) 25 MCG tablet Take 25 mcg by mouth daily.     LORazepam  (ATIVAN ) 1 MG tablet Take 1 mg by mouth every 6 (six) hours as needed for anxiety.     methylphenidate  (RITALIN ) 20 MG tablet Take 20 mg by mouth daily as needed.     Multiple Vitamin (MULTI-VITAMIN DAILY PO) Take by mouth.     OXcarbazepine  (TRILEPTAL ) 150 MG tablet Take 300 mg by mouth at bedtime.     progesterone (PROMETRIUM) 100 MG capsule Take 100 mg by mouth daily.     Semaglutide -Weight Management (WEGOVY ) 0.25 MG/0.5ML SOAJ Inject 0.25 mg into the skin once a week. 2 mL 1   VRAYLAR capsule Take 3 mg by mouth at bedtime.     AZSTARYS 52.3-10.4 MG CAPS Take 1 capsule by mouth every morning. (Patient not taking: Reported on 08/31/2024)     No current facility-administered medications on file prior to visit.  [2]  Allergies Allergen Reactions   Ambien [Zolpidem Tartrate]     Seizure    Ciprofloxacin  Anaphylaxis   Wellbutrin [Bupropion]     Seizure   "

## 2024-08-31 NOTE — Assessment & Plan Note (Signed)
 Last B12 in 08/2023 was normal at 587. Continue B12 supplementation IM 1000 mcg monthly. Further recommendation will be given according to B12 result.
